# Patient Record
Sex: Female | Born: 1955 | Race: White | Hispanic: No | Smoking: Current every day smoker
Health system: Southern US, Community
[De-identification: ages and names within clinical notes are randomized; demographics above are authoritative.]

## PROBLEM LIST (undated history)

## (undated) DIAGNOSIS — B1920 Unspecified viral hepatitis C without hepatic coma: Secondary | ICD-10-CM

## (undated) DIAGNOSIS — U071 COVID-19: Secondary | ICD-10-CM

## (undated) DIAGNOSIS — I10 Essential (primary) hypertension: Secondary | ICD-10-CM

## (undated) DIAGNOSIS — M1990 Unspecified osteoarthritis, unspecified site: Secondary | ICD-10-CM

## (undated) DIAGNOSIS — F1010 Alcohol abuse, uncomplicated: Secondary | ICD-10-CM

## (undated) DIAGNOSIS — R569 Unspecified convulsions: Secondary | ICD-10-CM

## (undated) HISTORY — PX: ABDOMINAL HYSTERECTOMY: SHX81

## (undated) HISTORY — PX: ELBOW SURGERY: SHX618

---

## 2007-10-12 ENCOUNTER — Ambulatory Visit: Payer: Self-pay | Admitting: Family Medicine

## 2012-05-18 ENCOUNTER — Ambulatory Visit: Payer: Self-pay | Admitting: Emergency Medicine

## 2017-07-18 ENCOUNTER — Other Ambulatory Visit: Payer: Self-pay

## 2017-07-18 ENCOUNTER — Observation Stay
Admission: EM | Admit: 2017-07-18 | Discharge: 2017-07-20 | Disposition: A | Discharge status: Home / Self Care | Payer: Medicaid Other | Attending: Internal Medicine | Admitting: Internal Medicine

## 2017-07-18 DIAGNOSIS — E861 Hypovolemia: Secondary | ICD-10-CM | POA: Diagnosis not present

## 2017-07-18 DIAGNOSIS — Z885 Allergy status to narcotic agent status: Secondary | ICD-10-CM | POA: Insufficient documentation

## 2017-07-18 DIAGNOSIS — F17210 Nicotine dependence, cigarettes, uncomplicated: Secondary | ICD-10-CM | POA: Diagnosis not present

## 2017-07-18 DIAGNOSIS — Z79899 Other long term (current) drug therapy: Secondary | ICD-10-CM | POA: Insufficient documentation

## 2017-07-18 DIAGNOSIS — F419 Anxiety disorder, unspecified: Secondary | ICD-10-CM | POA: Insufficient documentation

## 2017-07-18 DIAGNOSIS — M1990 Unspecified osteoarthritis, unspecified site: Secondary | ICD-10-CM | POA: Diagnosis not present

## 2017-07-18 DIAGNOSIS — E871 Hypo-osmolality and hyponatremia: Principal | ICD-10-CM

## 2017-07-18 DIAGNOSIS — I10 Essential (primary) hypertension: Secondary | ICD-10-CM | POA: Diagnosis not present

## 2017-07-18 DIAGNOSIS — Z882 Allergy status to sulfonamides status: Secondary | ICD-10-CM | POA: Insufficient documentation

## 2017-07-18 DIAGNOSIS — E876 Hypokalemia: Secondary | ICD-10-CM | POA: Insufficient documentation

## 2017-07-18 DIAGNOSIS — E860 Dehydration: Secondary | ICD-10-CM | POA: Diagnosis present

## 2017-07-18 DIAGNOSIS — R1110 Vomiting, unspecified: Secondary | ICD-10-CM | POA: Diagnosis not present

## 2017-07-18 HISTORY — DX: Unspecified convulsions: R56.9

## 2017-07-18 HISTORY — DX: Essential (primary) hypertension: I10

## 2017-07-18 HISTORY — DX: Unspecified osteoarthritis, unspecified site: M19.90

## 2017-07-18 LAB — URINALYSIS, COMPLETE (UACMP) WITH MICROSCOPIC
BACTERIA UA: NONE SEEN
Bilirubin Urine: NEGATIVE
Glucose, UA: NEGATIVE mg/dL
KETONES UR: 5 mg/dL — AB
Leukocytes, UA: NEGATIVE
Nitrite: NEGATIVE
PH: 7 (ref 5.0–8.0)
Protein, ur: NEGATIVE mg/dL
SPECIFIC GRAVITY, URINE: 1.001 — AB (ref 1.005–1.030)
SQUAMOUS EPITHELIAL / LPF: NONE SEEN

## 2017-07-18 LAB — BASIC METABOLIC PANEL
Anion gap: 10 (ref 5–15)
BUN: 8 mg/dL (ref 6–20)
CALCIUM: 9.1 mg/dL (ref 8.9–10.3)
CO2: 22 mmol/L (ref 22–32)
Chloride: 97 mmol/L — ABNORMAL LOW (ref 101–111)
Creatinine, Ser: 0.6 mg/dL (ref 0.44–1.00)
GFR calc Af Amer: >60 mL/min (ref >60)
GLUCOSE: 104 mg/dL — AB (ref 65–99)
POTASSIUM: 3 mmol/L — AB (ref 3.5–5.1)
Sodium: 129 mmol/L — ABNORMAL LOW (ref 135–145)

## 2017-07-18 LAB — CBC WITH DIFFERENTIAL/PLATELET
BASOS ABS: 0.1 10*3/uL (ref 0–0.1)
BASOS PCT: 1 %
Eosinophils Absolute: 0 10*3/uL (ref 0–0.7)
Eosinophils Relative: 1 %
HEMATOCRIT: 45.7 % (ref 35.0–47.0)
HEMOGLOBIN: 16.3 g/dL — AB (ref 12.0–16.0)
Lymphocytes Relative: 29 %
Lymphs Abs: 2.5 10*3/uL (ref 1.0–3.6)
MCH: 32.9 pg (ref 26.0–34.0)
MCHC: 35.5 g/dL (ref 32.0–36.0)
MCV: 92.7 fL (ref 80.0–100.0)
MONOS PCT: 12 %
Monocytes Absolute: 1.1 10*3/uL — ABNORMAL HIGH (ref 0.2–0.9)
NEUTROS ABS: 5.1 10*3/uL (ref 1.4–6.5)
NEUTROS PCT: 57 %
Platelets: 193 10*3/uL (ref 150–440)
RBC: 4.94 MIL/uL (ref 3.80–5.20)
RDW: 12.5 % (ref 11.5–14.5)
WBC: 8.7 10*3/uL (ref 3.6–11.0)

## 2017-07-18 LAB — COMPREHENSIVE METABOLIC PANEL
ALBUMIN: 4.2 g/dL (ref 3.5–5.0)
ALK PHOS: 57 U/L (ref 38–126)
ALT: 19 U/L (ref 14–54)
AST: 48 U/L — AB (ref 15–41)
Anion gap: 14 (ref 5–15)
BILIRUBIN TOTAL: 2 mg/dL — AB (ref 0.3–1.2)
BUN: 8 mg/dL (ref 6–20)
CALCIUM: 9.6 mg/dL (ref 8.9–10.3)
CO2: 23 mmol/L (ref 22–32)
Chloride: 82 mmol/L — ABNORMAL LOW (ref 101–111)
Creatinine, Ser: 0.66 mg/dL (ref 0.44–1.00)
GFR calc Af Amer: >60 mL/min (ref >60)
GFR calc non Af Amer: >60 mL/min (ref >60)
GLUCOSE: 133 mg/dL — AB (ref 65–99)
Potassium: 3.9 mmol/L (ref 3.5–5.1)
Sodium: 119 mmol/L — CL (ref 135–145)
TOTAL PROTEIN: 8.2 g/dL — AB (ref 6.5–8.1)

## 2017-07-18 LAB — LIPASE, BLOOD: Lipase: 19 U/L (ref 11–51)

## 2017-07-18 MED ORDER — OXYCODONE-ACETAMINOPHEN 10-325 MG PO TABS: 1.0000 | ORAL_TABLET | Freq: Four times a day (QID) | ORAL | Status: DC | PRN

## 2017-07-18 MED ORDER — ONDANSETRON HCL 4 MG/2ML IJ SOLN
4.0000 mg | Freq: Once | INTRAMUSCULAR | Status: AC
  Administered 2017-07-18: 4 mg via INTRAVENOUS
  Filled 2017-07-18: qty 2

## 2017-07-18 MED ORDER — LORAZEPAM 2 MG/ML IJ SOLN
1.0000 mg | Freq: Once | INTRAMUSCULAR | Status: AC
  Administered 2017-07-18: 1 mg via INTRAVENOUS
  Filled 2017-07-18: qty 1

## 2017-07-18 MED ORDER — SODIUM CHLORIDE 0.9 % IV SOLN
INTRAVENOUS | Status: DC
  Administered 2017-07-18 (×2): via INTRAVENOUS

## 2017-07-18 MED ORDER — ALPRAZOLAM 1 MG PO TABS
1.0000 mg | ORAL_TABLET | Freq: Two times a day (BID) | ORAL | Status: DC | PRN
  Administered 2017-07-18 – 2017-07-20 (×5): 1 mg via ORAL
  Filled 2017-07-18 (×5): qty 1

## 2017-07-18 MED ORDER — PROPRANOLOL HCL ER 60 MG PO CP24
60.0000 mg | ORAL_CAPSULE | Freq: Every day | ORAL | Status: DC
  Administered 2017-07-18 – 2017-07-20 (×3): 60 mg via ORAL
  Filled 2017-07-18 (×3): qty 1

## 2017-07-18 MED ORDER — DOCUSATE SODIUM 100 MG PO CAPS
100.0000 mg | ORAL_CAPSULE | Freq: Two times a day (BID) | ORAL | Status: DC | PRN
  Administered 2017-07-19: 08:00:00 100 mg via ORAL
  Filled 2017-07-18: qty 1

## 2017-07-18 MED ORDER — HEPARIN SODIUM (PORCINE) 5000 UNIT/ML IJ SOLN
5000.0000 [IU] | Freq: Three times a day (TID) | INTRAMUSCULAR | Status: DC
  Administered 2017-07-18 – 2017-07-20 (×4): 5000 [IU] via SUBCUTANEOUS
  Filled 2017-07-18 (×4): qty 1

## 2017-07-18 MED ORDER — TRAZODONE HCL 50 MG PO TABS
50.0000 mg | ORAL_TABLET | Freq: Every day | ORAL | Status: DC
  Administered 2017-07-18 – 2017-07-19 (×2): 50 mg via ORAL
  Filled 2017-07-18 (×2): qty 1

## 2017-07-18 MED ORDER — ONDANSETRON HCL 4 MG/2ML IJ SOLN
4.0000 mg | Freq: Four times a day (QID) | INTRAMUSCULAR | Status: DC | PRN
  Administered 2017-07-18 – 2017-07-19 (×2): 4 mg via INTRAVENOUS
  Filled 2017-07-18 (×2): qty 2

## 2017-07-18 MED ORDER — OXYCODONE HCL 5 MG PO TABS
5.0000 mg | ORAL_TABLET | Freq: Four times a day (QID) | ORAL | Status: DC | PRN
  Administered 2017-07-19 – 2017-07-20 (×3): 5 mg via ORAL
  Filled 2017-07-18 (×3): qty 1

## 2017-07-18 MED ORDER — SODIUM CHLORIDE 0.9 % IV SOLN
1000.0000 mL | Freq: Once | INTRAVENOUS | Status: AC
  Administered 2017-07-18: 1000 mL via INTRAVENOUS

## 2017-07-18 MED ORDER — OXYCODONE-ACETAMINOPHEN 5-325 MG PO TABS
1.0000 | ORAL_TABLET | Freq: Four times a day (QID) | ORAL | Status: DC | PRN
  Administered 2017-07-18 – 2017-07-20 (×6): 1 via ORAL
  Filled 2017-07-18 (×6): qty 1

## 2017-07-18 MED ORDER — NICOTINE 21 MG/24HR TD PT24
21.0000 mg | MEDICATED_PATCH | Freq: Every day | TRANSDERMAL | Status: DC
  Filled 2017-07-18 (×3): qty 1

## 2017-07-18 NOTE — ED Provider Notes (Addendum)
[facility] Emergency Department Provider Note       Time seen: ----------------------------------------- 7:18 AM on 07/18/2017 -----------------------------------------   I have reviewed the triage vital signs and the nursing notes.  HISTORY   Chief Complaint Nausea    HPI Stephanie Skinner is a 61 y.o. female with a history of arthritis, anxiety, hypertension and seizures who presents to the ED for nausea and vomiting for the past 3 days.  Patient states she also developed some lower back pain yesterday and states she has a history of UTIs.  She reports she is unsure if she had a seizure at home.  She also notes she has been out of her Xanax for at least 2 days and she was concerned she may have a seizure.  She has had persistent vomiting but no diarrhea.  Past Medical History:  Diagnosis Date  . Arthritis   . Hypertension   . Seizures (HCC)     There are no active problems to display for this patient.   Past Surgical History:  Procedure Laterality Date  . ABDOMINAL HYSTERECTOMY     "partial hysterectomy"  . ELBOW SURGERY      Allergies Codeine and Sulfa antibiotics  Social History Social History   Tobacco Use  . Smoking status: Current Every Day Smoker    Packs/day: 0.50    Types: Cigarettes  . Smokeless tobacco: Never Used  Substance Use Topics  . Alcohol use: Yes    Comment: Pt states "couple glasses of wine a week"  . Drug use: No    Review of Systems Constitutional: Negative for fever. Cardiovascular: Negative for chest pain. Respiratory: Negative for shortness of breath. Gastrointestinal: Negative for abdominal pain, positive for vomiting Genitourinary: Positive for dysuria Musculoskeletal: Positive for back pain Skin: Negative for rash. Neurological: Negative for headaches, focal weakness or numbness. Psychiatric: Positive for anxiety  All systems negative/normal/unremarkable except as stated in the  HPI  ____________________________________________   PHYSICAL EXAM:  VITAL SIGNS: ED Triage Vitals  Enc Vitals Group     BP 07/18/17 0659 (!) 174/112     Pulse Rate 07/18/17 0659 82     Resp 07/18/17 0659 17     Temp 07/18/17 0659 97.8 F (36.6 C)     Temp Source 07/18/17 0659 Oral     SpO2 07/18/17 0659 97 %     Weight 07/18/17 0700 150 lb (68 kg)     Height 07/18/17 0700 5\' 6"  (1.676 m)     Head Circumference --      Peak Flow --      Pain Score 07/18/17 0659 8     Pain Loc --      Pain Edu? --      Excl. in GC? --     Constitutional: Alert and oriented.  Anxious, no distress Eyes: Conjunctivae are normal. Normal extraocular movements. ENT   Head: Normocephalic and atraumatic.   Nose: No congestion/rhinnorhea.   Mouth/Throat: Mucous membranes are moist.   Neck: No stridor. Cardiovascular: Normal rate, regular rhythm. No murmurs, rubs, or gallops. Respiratory: Normal respiratory effort without tachypnea nor retractions. Breath sounds are clear and equal bilaterally. No wheezes/rales/rhonchi. Gastrointestinal: Soft and nontender. Normal bowel sounds Musculoskeletal: Nontender with normal range of motion in extremities. No lower extremity tenderness nor edema. Neurologic:  Normal speech and language. No gross focal neurologic deficits are appreciated.  Skin:  Skin is warm, dry and intact. No rash noted. Psychiatric: Depressed mood and affect ____________________________________________  ED  COURSE:  Pertinent labs & imaging results that were available during my care of the patient were reviewed by me and considered in my medical decision making (see chart for details). Patient presents for nausea, vomiting and anxiety, we will assess with labs and imaging as indicated.   Procedures ____________________________________________   LABS (pertinent positives/negatives)  Labs Reviewed  CBC WITH DIFFERENTIAL/PLATELET - Abnormal; Notable for the following  components:      Result Value   Hemoglobin 16.3 (*)    Monocytes Absolute 1.1 (*)    All other components within normal limits  COMPREHENSIVE METABOLIC PANEL - Abnormal; Notable for the following components:   Sodium 119 (*)    Chloride 82 (*)    Glucose, Bld 133 (*)    Total Protein 8.2 (*)    AST 48 (*)    Total Bilirubin 2.0 (*)    All other components within normal limits  LIPASE, BLOOD  URINALYSIS, COMPLETE (UACMP) WITH MICROSCOPIC  ____________________________________________  DIFFERENTIAL DIAGNOSIS   Anxiety, depression, medication withdrawal, gastroenteritis, electrolyte abnormality, dehydration  FINAL ASSESSMENT AND PLAN  Vomiting, anxiety, hyponatremia   Plan: Patient had presented for persistent nausea and vomiting for the past 3 days. Patient's labs did reveal concerning hyponatremia with a sodium of 119 and a chloride of 82.  We have started her on saline as well as Ativan and Zofran.  Due to her hyponatremia she will need to be admitted for further evaluation.   Emily FilbertWilliams, Jonathan E, MD   Note: This note was generated in part or whole with voice recognition software. Voice recognition is usually quite accurate but there are transcription errors that can and very often do occur. I apologize for any typographical errors that were not detected and corrected.     Emily FilbertWilliams, Jonathan E, MD 07/18/17 [id number]    Emily FilbertWilliams, Jonathan E, MD 07/18/17 [id number]

## 2017-07-18 NOTE — ED Triage Notes (Signed)
Per EMS, pt reports N/V for the last 3 days. Pt states she also developed lower back pain yest and states hx of UTIs. Pt reports she is unsure if she has had fever at home. Pt also states she has hx of seizures, pt A&O at this time, in NAD.

## 2017-07-18 NOTE — H&P (Signed)
[facility] [place] [facility]   PATIENT NAME: Stephanie RinksDixie Skinner    MR#:  [id number]  DATE OF BIRTH:  04/10/1956  DATE OF ADMISSION:  07/18/2017  PRIMARY CARE PHYSICIAN: Patient, No Pcp Per   REQUESTING/REFERRING PHYSICIAN: Williams  CHIEF COMPLAINT:   Chief Complaint  Patient presents with  . Nausea    HISTORY OF PRESENT ILLNESS: Stephanie RinksDixie Skinner  is a 61 y.o. female with a known history of Htn, Seizures and anxiety- had nausea and vomit for last 3-4 days. No associated diarrhea or abd pain. Denies any urinary symptoms. Felt very weak yesterday and had chills, so decided to come to ER.  Also ran out of her xanax pills last 2 days ( this happened after she started vomiting, and she could not go get new prescriptions) so feeling very shaky. Noted to be Hyponatremic and dehydrated in ER>  PAST MEDICAL HISTORY:   Past Medical History:  Diagnosis Date  . Arthritis   . Hypertension   . Seizures (HCC)     PAST SURGICAL HISTORY:  Past Surgical History:  Procedure Laterality Date  . ABDOMINAL HYSTERECTOMY     "partial hysterectomy"  . ELBOW SURGERY      SOCIAL HISTORY:  Social History   Tobacco Use  . Smoking status: Current Every Day Smoker    Packs/day: 0.50    Types: Cigarettes  . Smokeless tobacco: Never Used  Substance Use Topics  . Alcohol use: Yes    Comment: Pt states "couple glasses of wine a week"    FAMILY HISTORY:  Family History  Problem Relation Age of Onset  . Diabetes Sister   . Colon cancer Sister     DRUG ALLERGIES:  Allergies  Allergen Reactions  . Codeine Nausea And Vomiting  . Sulfa Antibiotics Nausea And Vomiting    REVIEW OF SYSTEMS:   CONSTITUTIONAL: No fever,positive for fatigue or weakness.  EYES: No blurred or double vision.  EARS, NOSE, AND THROAT: No tinnitus or ear pain.  RESPIRATORY: No cough, shortness of breath, wheezing or hemoptysis.  CARDIOVASCULAR: No chest pain, orthopnea, edema.  GASTROINTESTINAL:  positive for nausea, vomiting,no diarrhea or abdominal pain.  GENITOURINARY: No dysuria, hematuria.  ENDOCRINE: No polyuria, nocturia,  HEMATOLOGY: No anemia, easy bruising or bleeding SKIN: No rash or lesion. MUSCULOSKELETAL: No joint pain or arthritis.   NEUROLOGIC: No tingling, numbness, weakness.  PSYCHIATRY: No anxiety or depression.   MEDICATIONS AT HOME:  Prior to Admission medications   Medication Sig Start Date End Date Taking? Authorizing Provider  oxyCODONE-acetaminophen (PERCOCET) 10-325 MG tablet Take 1 tablet by mouth every 6 (six) hours as needed for pain.   Yes [provider]  ALPRAZolam Prudy Feeler(XANAX) 1 MG tablet Take 1 tablet by mouth 2 (two) times daily as needed. 07/14/17   [provider]  cetirizine (ZYRTEC) 10 MG tablet Take 10 mg by mouth daily. 07/09/17   [provider]  propranolol ER (INDERAL LA) 60 MG 24 hr capsule Take 1 capsule by mouth daily. 06/17/17   [provider]  traZODone (DESYREL) 50 MG tablet Take 1 tablet by mouth at bedtime. 07/14/17   [provider]  Vitamin D, Ergocalciferol, (DRISDOL) 50000 units CAPS capsule Take 1 capsule by mouth once a week. 07/09/17   [provider]      PHYSICAL EXAMINATION:   VITAL SIGNS: Blood pressure (!) 174/112, pulse 82, temperature 97.8 F (36.6 C), temperature source Oral, resp. rate 17, height 5\' 6"  (1.676 m), weight 68  kg (150 lb), SpO2 97 %.  GENERAL:  61 y.o.-year-old patient lying in the bed with no acute distress.  EYES: Pupils equal, round, reactive to light and accommodation. No scleral icterus. Extraocular muscles intact.  HEENT: Head atraumatic, normocephalic. Oropharynx and nasopharynx clear.  NECK:  Supple, no jugular venous distention. No thyroid enlargement, no tenderness.  LUNGS: Normal breath sounds bilaterally, no wheezing, rales,rhonchi or crepitation. No use of accessory muscles of respiration.  CARDIOVASCULAR: S1, S2 normal. No murmurs,  rubs, or gallops.  ABDOMEN: Soft, nontender, nondistended. Bowel sounds present. No organomegaly or mass.  EXTREMITIES: No pedal edema, cyanosis, or clubbing.  NEUROLOGIC: Cranial nerves II through XII are intact. Muscle strength 5/5 in all extremities. Sensation intact. Gait not checked.  PSYCHIATRIC: The patient is alert and oriented x 3.  SKIN: No obvious rash, lesion, or ulcer.   LABORATORY PANEL:   CBC Recent Labs  Lab 07/18/17 0719  WBC 8.7  HGB 16.3*  HCT 45.7  PLT 193  MCV 92.7  MCH 32.9  MCHC 35.5  RDW 12.5  LYMPHSABS 2.5  MONOABS 1.1*  EOSABS 0.0  BASOSABS 0.1   ------------------------------------------------------------------------------------------------------------------  Chemistries  Recent Labs  Lab 07/18/17 0719  NA 119*  K 3.9  CL 82*  CO2 23  GLUCOSE 133*  BUN 8  CREATININE 0.66  CALCIUM 9.6  AST 48*  ALT 19  ALKPHOS 57  BILITOT 2.0*   ------------------------------------------------------------------------------------------------------------------ estimated creatinine clearance is 68.3 mL/min (by C-G formula based on SCr of 0.66 mg/dL). ------------------------------------------------------------------------------------------------------------------ No results for input(s): TSH, T4TOTAL, T3FREE, THYROIDAB in the last 72 hours.  Invalid input(s): FREET3   Coagulation profile No results for input(s): INR, PROTIME in the last 168 hours. ------------------------------------------------------------------------------------------------------------------- No results for input(s): DDIMER in the last 72 hours. -------------------------------------------------------------------------------------------------------------------  Cardiac Enzymes No results for input(s): CKMB, TROPONINI, MYOGLOBIN in the last 168 hours.  Invalid input(s):  CK ------------------------------------------------------------------------------------------------------------------ Invalid input(s): POCBNP  ---------------------------------------------------------------------------------------------------------------  Urinalysis No results found for: COLORURINE, APPEARANCEUR, LABSPEC, PHURINE, GLUCOSEU, HGBUR, BILIRUBINUR, KETONESUR, PROTEINUR, UROBILINOGEN, NITRITE, LEUKOCYTESUR   RADIOLOGY: No results found.  EKG: No orders found for this or any previous visit.  IMPRESSION AND PLAN:  * Hyponatremia   Hypovolemic due to Vomiting and dehydration   IV fluids, NS    * Dehydration   IV fluids  * Vomiting   IV zofran   Likely Viral gastritis  * Htn   Uncontrolled as missed her tablets yesterday   COnt Home meds  * Anxiety   Cont xanax  * Hx of seizures   She is off any seizure meds for long time.   Cont monitor with xanax.  * Active smoking   Tobacco cesation counceling   Councelled for 4 min, offered nicotine patch.  All the records are reviewed and case discussed with ED provider. Management plans discussed with the patient, family and they are in agreement.  CODE STATUS: Full. Code Status History    This patient does not have a recorded code status. Please follow your organizational policy for patients in this situation.       TOTAL TIME TAKING CARE OF THIS PATIENT: 55 minutes.    Altamese DillingVaibhavkumar Donte Kary M.D on 07/18/2017   Between 7am to 6pm - Pager - [phone]  After 6pm go to [email or web address] - password [email or web address]  [facility]  Office  [phone]  CC: Primary care physician; Patient, No Pcp Per   Note: This dictation was prepared with Dragon dictation along with smaller phrase technology. Any transcriptional  errors that result from this process are unintentional.

## 2017-07-18 NOTE — ED Notes (Signed)
Date and time results received: 07/18/17 0746   Test: Sodium Critical Value: 119  Name of Provider Notified: Dr. Mayford KnifeWilliams  Orders Received? Or Actions Taken?: Acknowledged, no new orders at this time.

## 2017-07-19 LAB — CBC
HCT: 38.9 % (ref 35.0–47.0)
HEMOGLOBIN: 13.6 g/dL (ref 12.0–16.0)
MCH: 33.5 pg (ref 26.0–34.0)
MCHC: 34.9 g/dL (ref 32.0–36.0)
MCV: 96.1 fL (ref 80.0–100.0)
PLATELETS: 172 10*3/uL (ref 150–440)
RBC: 4.05 MIL/uL (ref 3.80–5.20)
RDW: 12.7 % (ref 11.5–14.5)
WBC: 5.7 10*3/uL (ref 3.6–11.0)

## 2017-07-19 LAB — BASIC METABOLIC PANEL
Anion gap: 5 (ref 5–15)
BUN: 6 mg/dL (ref 6–20)
CO2: 27 mmol/L (ref 22–32)
CREATININE: 0.63 mg/dL (ref 0.44–1.00)
Calcium: 8.9 mg/dL (ref 8.9–10.3)
Chloride: 102 mmol/L (ref 101–111)
GFR calc Af Amer: >60 mL/min (ref >60)
GLUCOSE: 105 mg/dL — AB (ref 65–99)
Potassium: 2.8 mmol/L — ABNORMAL LOW (ref 3.5–5.1)
Sodium: 134 mmol/L — ABNORMAL LOW (ref 135–145)

## 2017-07-19 LAB — MAGNESIUM: MAGNESIUM: 1.9 mg/dL (ref 1.7–2.4)

## 2017-07-19 LAB — HEPATIC FUNCTION PANEL
ALT: 20 U/L (ref 14–54)
AST: 26 U/L (ref 15–41)
Albumin: 3.6 g/dL (ref 3.5–5.0)
Alkaline Phosphatase: 45 U/L (ref 38–126)
BILIRUBIN DIRECT: 0.1 mg/dL (ref 0.1–0.5)
BILIRUBIN INDIRECT: 0.6 mg/dL (ref 0.3–0.9)
TOTAL PROTEIN: 6.7 g/dL (ref 6.5–8.1)
Total Bilirubin: 0.7 mg/dL (ref 0.3–1.2)

## 2017-07-19 LAB — HIV ANTIBODY (ROUTINE TESTING W REFLEX): HIV Screen 4th Generation wRfx: NONREACTIVE

## 2017-07-19 LAB — TSH: TSH: 1.997 u[IU]/mL (ref 0.350–4.500)

## 2017-07-19 MED ORDER — POTASSIUM CHLORIDE IN NACL 40-0.9 MEQ/L-% IV SOLN
INTRAVENOUS | Status: AC
  Administered 2017-07-19: 11:00:00 50 mL/h via INTRAVENOUS
  Filled 2017-07-19: qty 1000

## 2017-07-19 MED ORDER — POTASSIUM CHLORIDE CRYS ER 20 MEQ PO TBCR
40.0000 meq | EXTENDED_RELEASE_TABLET | Freq: Two times a day (BID) | ORAL | Status: DC
  Administered 2017-07-19 – 2017-07-20 (×3): 40 meq via ORAL
  Filled 2017-07-19 (×3): qty 2

## 2017-07-19 NOTE — Plan of Care (Signed)
  Progressing Education: Knowledge of General Education information will improve 07/19/2017 0309 - Progressing by Florinda MarkerWelch, Jaydalee Bardwell T, RN Health Behavior/Discharge Planning: Ability to manage health-related needs will improve 07/19/2017 0309 - Progressing by Florinda MarkerWelch, Myrtis Maille T, RN Clinical Measurements: Ability to maintain clinical measurements within normal limits will improve 07/19/2017 0309 - Progressing by Florinda MarkerWelch, Maxxwell Edgett T, RN Will remain free from infection 07/19/2017 0309 - Progressing by Florinda MarkerWelch, Hewitt Garner T, RN Diagnostic test results will improve 07/19/2017 0309 - Progressing by Florinda MarkerWelch, Severo Beber T, RN Respiratory complications will improve 07/19/2017 0309 - Progressing by Florinda MarkerWelch, Ragan Duhon T, RN Cardiovascular complication will be avoided 07/19/2017 0309 - Progressing by Florinda MarkerWelch, Maryann Mccall T, RN

## 2017-07-19 NOTE — Progress Notes (Signed)
[facility] - [place] at [facility]   PATIENT NAME: Stephanie RinksDixie Skinner    MR#:  [id number]  DATE OF BIRTH:  12/19/1955  SUBJECTIVE:  CHIEF COMPLAINT:   Chief Complaint  Patient presents with  . Nausea     Improved, no vomit anymore. Tolerated liquid diet. Did not sleep much last night. Potassium is very low, Sodium improved.  REVIEW OF SYSTEMS:  CONSTITUTIONAL: No fever,positive for fatigue or weakness.  EYES: No blurred or double vision.  EARS, NOSE, AND THROAT: No tinnitus or ear pain.  RESPIRATORY: No cough, shortness of breath, wheezing or hemoptysis.  CARDIOVASCULAR: No chest pain, orthopnea, edema.  GASTROINTESTINAL: No nausea, vomiting, diarrhea or abdominal pain.  GENITOURINARY: No dysuria, hematuria.  ENDOCRINE: No polyuria, nocturia,  HEMATOLOGY: No anemia, easy bruising or bleeding SKIN: No rash or lesion. MUSCULOSKELETAL: No joint pain or arthritis.   NEUROLOGIC: No tingling, numbness, weakness.  PSYCHIATRY: No anxiety or depression.   ROS  DRUG ALLERGIES:   Allergies  Allergen Reactions  . Codeine Nausea And Vomiting  . Sulfa Antibiotics Nausea And Vomiting    VITALS:  Blood pressure (!) 134/58, pulse 67, temperature 97.9 F (36.6 C), temperature source Oral, resp. rate 18, height 5\' 6"  (1.676 m), weight 68 kg (150 lb), SpO2 96 %.  PHYSICAL EXAMINATION:  GENERAL:  61 y.o.-year-old patient lying in the bed with no acute distress.  EYES: Pupils equal, round, reactive to light and accommodation. No scleral icterus. Extraocular muscles intact.  HEENT: Head atraumatic, normocephalic. Oropharynx and nasopharynx clear.  NECK:  Supple, no jugular venous distention. No thyroid enlargement, no tenderness.  LUNGS: Normal breath sounds bilaterally, no wheezing, rales,rhonchi or crepitation. No use of accessory muscles of respiration.  CARDIOVASCULAR: S1, S2 normal. No murmurs, rubs, or gallops.  ABDOMEN: Soft, nontender, nondistended. Bowel sounds present.  No organomegaly or mass.  EXTREMITIES: No pedal edema, cyanosis, or clubbing.  NEUROLOGIC: Cranial nerves II through XII are intact. Muscle strength 5/5 in all extremities. Sensation intact. Gait not checked.  PSYCHIATRIC: The patient is alert and oriented x 3.  SKIN: No obvious rash, lesion, or ulcer.   Physical Exam LABORATORY PANEL:   CBC Recent Labs  Lab 07/19/17 0400  WBC 5.7  HGB 13.6  HCT 38.9  PLT 172   ------------------------------------------------------------------------------------------------------------------  Chemistries  Recent Labs  Lab 07/19/17 0400  NA 134*  K 2.8*  CL 102  CO2 27  GLUCOSE 105*  BUN 6  CREATININE 0.63  CALCIUM 8.9  MG 1.9  AST 26  ALT 20  ALKPHOS 45  BILITOT 0.7   ------------------------------------------------------------------------------------------------------------------  Cardiac Enzymes No results for input(s): TROPONINI in the last 168 hours. ------------------------------------------------------------------------------------------------------------------  RADIOLOGY:  No results found.  ASSESSMENT AND PLAN:   Principal Problem:   Hyponatremia   * Hyponatremia   Hypovolemic due to Vomiting and dehydration   IV fluids, NS  TSH normal.    Sodium improved.    * Dehydration   IV fluids  * Hypokalemia    Checked Mg, replace IV and oral.  * Vomiting   IV zofran   Likely Viral gastritis   Improved now.  * Htn   Uncontrolled as missed her tablets yesterday   COnt Home meds  * Anxiety   Cont xanax  * Hx of seizures   She is off any seizure meds for long time.   Cont monitor with xanax.  * Active smoking   Tobacco cesation counceling   Councelled for 4 min, offered nicotine  patch.   All the records are reviewed and case discussed with Care Management/Social Workerr. Management plans discussed with the patient, family and they are in agreement.  CODE STATUS: full.  TOTAL TIME TAKING CARE  OF THIS PATIENT: 35 minutes.     POSSIBLE D/C IN 1-2 DAYS, DEPENDING ON CLINICAL CONDITION.   Altamese DillingVaibhavkumar Waseem Suess M.D on 07/19/2017   Between 7am to 6pm - Pager - [phone]  After 6pm go to [email or web address] - password [email or web address]  [facility]  Office  [phone]  CC: Primary care physician; Patient, No Pcp Per  Note: This dictation was prepared with Dragon dictation along with smaller phrase technology. Any transcriptional errors that result from this process are unintentional.

## 2017-07-20 DIAGNOSIS — E860 Dehydration: Secondary | ICD-10-CM | POA: Diagnosis present

## 2017-07-20 LAB — COMPREHENSIVE METABOLIC PANEL
ALBUMIN: 3.4 g/dL — AB (ref 3.5–5.0)
ALK PHOS: 40 U/L (ref 38–126)
ALT: 22 U/L (ref 14–54)
AST: 29 U/L (ref 15–41)
Anion gap: 5 (ref 5–15)
BILIRUBIN TOTAL: 0.6 mg/dL (ref 0.3–1.2)
BUN: 6 mg/dL (ref 6–20)
CALCIUM: 8.9 mg/dL (ref 8.9–10.3)
CO2: 26 mmol/L (ref 22–32)
Chloride: 106 mmol/L (ref 101–111)
Creatinine, Ser: 0.63 mg/dL (ref 0.44–1.00)
GFR calc Af Amer: >60 mL/min (ref >60)
GFR calc non Af Amer: >60 mL/min (ref >60)
GLUCOSE: 97 mg/dL (ref 65–99)
POTASSIUM: 4.2 mmol/L (ref 3.5–5.1)
Sodium: 137 mmol/L (ref 135–145)
Total Protein: 6.1 g/dL — ABNORMAL LOW (ref 6.5–8.1)

## 2017-07-20 MED ORDER — PROPRANOLOL HCL ER 60 MG PO CP24: 60.0000 mg | ORAL_CAPSULE | Freq: Every day | ORAL | 0 refills | Status: DC

## 2017-07-20 NOTE — Plan of Care (Signed)
  Adequate for Discharge Education: Knowledge of General Education information will improve 07/20/2017 0936 - Adequate for Discharge by Andreina Outten, Toma Aranonna K, RN Health Behavior/Discharge Planning: Ability to manage health-related needs will improve 07/20/2017 0936 - Adequate for Discharge by Marrian SalvageHeslep, Solangel Mcmanaway K, RN Clinical Measurements: Ability to maintain clinical measurements within normal limits will improve 07/20/2017 0936 - Adequate for Discharge by Marrian SalvageHeslep, Avah Bashor K, RN Will remain free from infection 07/20/2017 [id number] - Adequate for Discharge by Marrian SalvageHeslep, Coree Brame K, RN Diagnostic test results will improve 07/20/2017 0936 - Adequate for Discharge by Marrian SalvageHeslep, Michella Detjen K, RN Respiratory complications will improve 07/20/2017 0936 - Adequate for Discharge by Marrian SalvageHeslep, Sou Nohr K, RN Cardiovascular complication will be avoided 07/20/2017 [id number] - Adequate for Discharge by Antwoin Lackey, Toma Aranonna K, RN

## 2017-07-20 NOTE — Discharge Summary (Signed)
[facility] [place] at [facility]   PATIENT NAME: Stephanie RinksDixie Skinner    MR#:  [id number]  DATE OF BIRTH:  07/24/1956  DATE OF ADMISSION:  07/18/2017 ADMITTING PHYSICIAN: Altamese DillingVaibhavkumar Nanda Bittick, MD  DATE OF DISCHARGE: 07/20/2017  PRIMARY CARE PHYSICIAN: Patient, No Pcp Per    ADMISSION DIAGNOSIS:  Dehydration [E86.0] Anxiety [F41.9] Hyponatremia [E87.1]  DISCHARGE DIAGNOSIS:  Principal Problem:   Hyponatremia   SECONDARY DIAGNOSIS:   Past Medical History:  Diagnosis Date  . Arthritis   . Hypertension   . Seizures (HCC)     HOSPITAL COURSE:   * Hyponatremia Hypovolemic due to Vomiting and dehydration IV fluids, NS  TSH normal.    Sodium improved.  * Dehydration IV fluids  * Hypokalemia    Checked Mg, replace IV and oral.   Improved now.  * Vomiting IV zofran Likely Viral gastritis   Improved now.  * Htn Uncontrolled as missed her tablets yesterday COnt Home meds  * Anxiety Cont xanax  * Hx of seizures She is off any seizure meds for long time. Cont monitor with xanax.  * Active smoking Tobacco cesation counceling Councelled for 4 min, offered nicotine patch.    DISCHARGE CONDITIONS:   Stable.  CONSULTS OBTAINED:    DRUG ALLERGIES:   Allergies  Allergen Reactions  . Codeine Nausea And Vomiting  . Sulfa Antibiotics Nausea And Vomiting    DISCHARGE MEDICATIONS:   Current Discharge Medication List    CONTINUE these medications which have CHANGED   Details  propranolol ER (INDERAL LA) 60 MG 24 hr capsule Take 1 capsule (60 mg total) by mouth daily. Qty: 7 capsule, Refills: 0      CONTINUE these medications which have NOT CHANGED   Details  oxyCODONE-acetaminophen (PERCOCET) 10-325 MG tablet Take 1 tablet by mouth every 6 (six) hours as needed for pain.    ALPRAZolam (XANAX) 1 MG tablet Take 1 tablet by mouth 2 (two) times daily as needed. Refills: 3    cetirizine (ZYRTEC)  10 MG tablet Take 10 mg by mouth daily. Refills: 4    traZODone (DESYREL) 50 MG tablet Take 1 tablet by mouth at bedtime. Refills: 4    Vitamin D, Ergocalciferol, (DRISDOL) 50000 units CAPS capsule Take 1 capsule by mouth once a week. Refills: 5         DISCHARGE INSTRUCTIONS:    Follow with PMD in 1-2 weeks.  If you experience worsening of your admission symptoms, develop shortness of breath, life threatening emergency, suicidal or homicidal thoughts you must seek medical attention immediately by calling 911 or calling your MD immediately  if symptoms less severe.  You Must read complete instructions/literature along with all the possible adverse reactions/side effects for all the Medicines you take and that have been prescribed to you. Take any new Medicines after you have completely understood and accept all the possible adverse reactions/side effects.   Please note  You were cared for by a hospitalist during your hospital stay. If you have any questions about your discharge medications or the care you received while you were in the hospital after you are discharged, you can call the unit and asked to speak with the hospitalist on call if the hospitalist that took care of you is not available. Once you are discharged, your primary care physician will handle any further medical issues. Please note that NO REFILLS for any discharge medications will be authorized once you are discharged, as it is imperative that you  return to your primary care physician (or establish a relationship with a primary care physician if you do not have one) for your aftercare needs so that they can reassess your need for medications and monitor your lab values.    Today   CHIEF COMPLAINT:   Chief Complaint  Patient presents with  . Nausea    HISTORY OF PRESENT ILLNESS:  Stephanie Skinner  is a 61 y.o. female with a known history of Htn, Seizures and anxiety- had nausea and vomit for last 3-4 days. No  associated diarrhea or abd pain. Denies any urinary symptoms. Felt very weak yesterday and had chills, so decided to come to ER.  Also ran out of her xanax pills last 2 days ( this happened after she started vomiting, and she could not go get new prescriptions) so feeling very shaky. Noted to be Hyponatremic and dehydrated in ER   VITAL SIGNS:  Blood pressure (!) 141/61, pulse 61, temperature 97.9 F (36.6 C), temperature source Oral, resp. rate 19, height 5\' 6"  (1.676 m), weight 68 kg (150 lb), SpO2 97 %.  I/O:    Intake/Output Summary (Last 24 hours) at 07/20/2017 0904 Last data filed at 07/20/2017 0857 Gross per 24 hour  Intake 240 ml  Output -  Net 240 ml    PHYSICAL EXAMINATION:  GENERAL:  61 y.o.-year-old patient lying in the bed with no acute distress.  EYES: Pupils equal, round, reactive to light and accommodation. No scleral icterus. Extraocular muscles intact.  HEENT: Head atraumatic, normocephalic. Oropharynx and nasopharynx clear.  NECK:  Supple, no jugular venous distention. No thyroid enlargement, no tenderness.  LUNGS: Normal breath sounds bilaterally, no wheezing, rales,rhonchi or crepitation. No use of accessory muscles of respiration.  CARDIOVASCULAR: S1, S2 normal. No murmurs, rubs, or gallops.  ABDOMEN: Soft, non-tender, non-distended. Bowel sounds present. No organomegaly or mass.  EXTREMITIES: No pedal edema, cyanosis, or clubbing.  NEUROLOGIC: Cranial nerves II through XII are intact. Muscle strength 5/5 in all extremities. Sensation intact. Gait not checked.  PSYCHIATRIC: The patient is alert and oriented x 3.  SKIN: No obvious rash, lesion, or ulcer.   DATA REVIEW:   CBC Recent Labs  Lab 07/19/17 0400  WBC 5.7  HGB 13.6  HCT 38.9  PLT 172    Chemistries  Recent Labs  Lab 07/19/17 0400 07/20/17 0350  NA 134* 137  K 2.8* 4.2  CL 102 106  CO2 27 26  GLUCOSE 105* 97  BUN 6 6  CREATININE 0.63 0.63  CALCIUM 8.9 8.9  MG 1.9  --   AST 26 29   ALT 20 22  ALKPHOS 45 40  BILITOT 0.7 0.6    Cardiac Enzymes No results for input(s): TROPONINI in the last 168 hours.  Microbiology Results  No results found for this or any previous visit.  RADIOLOGY:  No results found.  EKG:  No orders found for this or any previous visit.    Management plans discussed with the patient, family and they are in agreement.  CODE STATUS:     Code Status Orders  (From admission, onward)        Start     Ordered   07/18/17 1013  Full code  Continuous     07/18/17 1012    Code Status History    Date Active Date Inactive Code Status Order ID Comments User Context   This patient has a current code status but no historical code status.  TOTAL TIME TAKING CARE OF THIS PATIENT: 35 minutes.    Altamese DillingVaibhavkumar Kodi Steil M.D on 07/20/2017 at 9:04 AM  Between 7am to 6pm - Pager - [phone]  After 6pm go to [email or web address] - password [email or web address]  [facility]  Office  [phone]  CC: Primary care physician; Patient, No Pcp Per   Note: This dictation was prepared with Dragon dictation along with smaller phrase technology. Any transcriptional errors that result from this process are unintentional.

## 2017-08-10 ENCOUNTER — Other Ambulatory Visit: Payer: Self-pay

## 2017-08-10 ENCOUNTER — Encounter: Payer: Self-pay | Admitting: Emergency Medicine

## 2017-08-10 ENCOUNTER — Inpatient Hospital Stay
Admission: EM | Admit: 2017-08-10 | Discharge: 2017-08-11 | DRG: 897 | Disposition: A | Discharge status: Home / Self Care | Payer: Medicaid Other | Attending: Specialist | Admitting: Specialist

## 2017-08-10 DIAGNOSIS — Z885 Allergy status to narcotic agent status: Secondary | ICD-10-CM

## 2017-08-10 DIAGNOSIS — F17210 Nicotine dependence, cigarettes, uncomplicated: Secondary | ICD-10-CM | POA: Diagnosis present

## 2017-08-10 DIAGNOSIS — F13939 Sedative, hypnotic or anxiolytic use, unspecified with withdrawal, unspecified: Secondary | ICD-10-CM

## 2017-08-10 DIAGNOSIS — F411 Generalized anxiety disorder: Secondary | ICD-10-CM | POA: Diagnosis present

## 2017-08-10 DIAGNOSIS — I10 Essential (primary) hypertension: Secondary | ICD-10-CM | POA: Diagnosis present

## 2017-08-10 DIAGNOSIS — F1110 Opioid abuse, uncomplicated: Secondary | ICD-10-CM | POA: Diagnosis present

## 2017-08-10 DIAGNOSIS — Z882 Allergy status to sulfonamides status: Secondary | ICD-10-CM

## 2017-08-10 DIAGNOSIS — E871 Hypo-osmolality and hyponatremia: Secondary | ICD-10-CM | POA: Diagnosis not present

## 2017-08-10 DIAGNOSIS — Z8619 Personal history of other infectious and parasitic diseases: Secondary | ICD-10-CM | POA: Diagnosis not present

## 2017-08-10 DIAGNOSIS — R569 Unspecified convulsions: Secondary | ICD-10-CM | POA: Diagnosis present

## 2017-08-10 DIAGNOSIS — F1310 Sedative, hypnotic or anxiolytic abuse, uncomplicated: Secondary | ICD-10-CM | POA: Diagnosis not present

## 2017-08-10 DIAGNOSIS — G8929 Other chronic pain: Secondary | ICD-10-CM | POA: Diagnosis present

## 2017-08-10 DIAGNOSIS — R112 Nausea with vomiting, unspecified: Secondary | ICD-10-CM | POA: Diagnosis present

## 2017-08-10 DIAGNOSIS — F13239 Sedative, hypnotic or anxiolytic dependence with withdrawal, unspecified: Secondary | ICD-10-CM

## 2017-08-10 DIAGNOSIS — E861 Hypovolemia: Secondary | ICD-10-CM | POA: Diagnosis present

## 2017-08-10 DIAGNOSIS — F13230 Sedative, hypnotic or anxiolytic dependence with withdrawal, uncomplicated: Principal | ICD-10-CM | POA: Diagnosis present

## 2017-08-10 HISTORY — DX: Unspecified viral hepatitis C without hepatic coma: B19.20

## 2017-08-10 LAB — BASIC METABOLIC PANEL
Anion gap: 15 (ref 5–15)
BUN: 6 mg/dL (ref 6–20)
CHLORIDE: 84 mmol/L — AB (ref 101–111)
CO2: 23 mmol/L (ref 22–32)
CREATININE: 0.5 mg/dL (ref 0.44–1.00)
Calcium: 9.5 mg/dL (ref 8.9–10.3)
GFR calc Af Amer: >60 mL/min (ref >60)
GFR calc non Af Amer: >60 mL/min (ref >60)
Glucose, Bld: 135 mg/dL — ABNORMAL HIGH (ref 65–99)
Potassium: 3.6 mmol/L (ref 3.5–5.1)
SODIUM: 122 mmol/L — AB (ref 135–145)

## 2017-08-10 LAB — MAGNESIUM: Magnesium: 1.7 mg/dL (ref 1.7–2.4)

## 2017-08-10 LAB — CBC
HCT: 43.5 % (ref 35.0–47.0)
HEMOGLOBIN: 15.3 g/dL (ref 12.0–16.0)
MCH: 33.4 pg (ref 26.0–34.0)
MCHC: 35.2 g/dL (ref 32.0–36.0)
MCV: 95.1 fL (ref 80.0–100.0)
Platelets: 168 10*3/uL (ref 150–440)
RBC: 4.57 MIL/uL (ref 3.80–5.20)
RDW: 12.1 % (ref 11.5–14.5)
WBC: 6.7 10*3/uL (ref 3.6–11.0)

## 2017-08-10 LAB — OSMOLALITY: Osmolality: 254 mOsm/kg — ABNORMAL LOW (ref 275–295)

## 2017-08-10 LAB — TROPONIN I: Troponin I: <0.03 ng/mL (ref <0.03)

## 2017-08-10 LAB — TSH: TSH: 1.972 u[IU]/mL (ref 0.350–4.500)

## 2017-08-10 LAB — SODIUM
Sodium: 128 mmol/L — ABNORMAL LOW (ref 135–145)
Sodium: 133 mmol/L — ABNORMAL LOW (ref 135–145)

## 2017-08-10 MED ORDER — SODIUM CHLORIDE 0.9 % IV SOLN
INTRAVENOUS | Status: DC
  Administered 2017-08-10 (×2): via INTRAVENOUS

## 2017-08-10 MED ORDER — DOCUSATE SODIUM 100 MG PO CAPS
100.0000 mg | ORAL_CAPSULE | Freq: Two times a day (BID) | ORAL | Status: DC
  Filled 2017-08-10 (×2): qty 1

## 2017-08-10 MED ORDER — LORAZEPAM 0.5 MG PO TABS
0.5000 mg | ORAL_TABLET | Freq: Once | ORAL | Status: AC
  Administered 2017-08-10: 0.5 mg via ORAL
  Filled 2017-08-10: qty 1

## 2017-08-10 MED ORDER — ALPRAZOLAM 1 MG PO TABS
1.0000 mg | ORAL_TABLET | Freq: Two times a day (BID) | ORAL | Status: DC
  Administered 2017-08-10 – 2017-08-11 (×3): 1 mg via ORAL
  Filled 2017-08-10 (×3): qty 1

## 2017-08-10 MED ORDER — LORAZEPAM 2 MG/ML IJ SOLN
1.0000 mg | Freq: Four times a day (QID) | INTRAMUSCULAR | Status: DC | PRN
  Administered 2017-08-10: 08:00:00 1 mg via INTRAVENOUS
  Filled 2017-08-10: qty 1

## 2017-08-10 MED ORDER — CLONIDINE HCL 0.1 MG PO TABS
0.1000 mg | ORAL_TABLET | Freq: Once | ORAL | Status: AC
  Administered 2017-08-10: 0.1 mg via ORAL
  Filled 2017-08-10: qty 1

## 2017-08-10 MED ORDER — LORAZEPAM 1 MG PO TABS
1.00 mg | ORAL_TABLET | ORAL | Status: DC
Start: ? — End: 2017-08-10

## 2017-08-10 MED ORDER — PROPRANOLOL HCL ER 60 MG PO CP24
60.0000 mg | ORAL_CAPSULE | Freq: Every day | ORAL | Status: DC
  Administered 2017-08-10 – 2017-08-11 (×2): 60 mg via ORAL
  Filled 2017-08-10 (×2): qty 1

## 2017-08-10 MED ORDER — ONDANSETRON HCL 4 MG/2ML IJ SOLN
4.0000 mg | Freq: Four times a day (QID) | INTRAMUSCULAR | Status: DC | PRN
  Administered 2017-08-10 (×2): 4 mg via INTRAVENOUS
  Filled 2017-08-10 (×2): qty 2

## 2017-08-10 MED ORDER — ENOXAPARIN SODIUM 40 MG/0.4ML ~~LOC~~ SOLN
40.0000 mg | SUBCUTANEOUS | Status: DC
  Administered 2017-08-10: 40 mg via SUBCUTANEOUS
  Filled 2017-08-10: qty 0.4

## 2017-08-10 MED ORDER — LORATADINE 10 MG PO TABS
10.0000 mg | ORAL_TABLET | Freq: Every day | ORAL | Status: DC
Start: 2017-08-10 — End: 2017-08-11
  Administered 2017-08-10 – 2017-08-11 (×2): 10 mg via ORAL
  Filled 2017-08-10 (×2): qty 1

## 2017-08-10 MED ORDER — ONDANSETRON 4 MG PO TBDP
4.0000 mg | ORAL_TABLET | Freq: Once | ORAL | Status: AC
  Administered 2017-08-10: 4 mg via ORAL
  Filled 2017-08-10: qty 1

## 2017-08-10 MED ORDER — PROCHLORPERAZINE EDISYLATE 5 MG/ML IJ SOLN
10.0000 mg | Freq: Four times a day (QID) | INTRAMUSCULAR | Status: DC | PRN
  Filled 2017-08-10: qty 2

## 2017-08-10 MED ORDER — ACETAMINOPHEN 325 MG PO TABS
650.0000 mg | ORAL_TABLET | Freq: Four times a day (QID) | ORAL | Status: DC | PRN
Start: 2017-08-10 — End: 2017-08-11

## 2017-08-10 MED ORDER — IBUPROFEN 400 MG PO TABS
400.0000 mg | ORAL_TABLET | Freq: Once | ORAL | Status: AC
  Administered 2017-08-10: 400 mg via ORAL
  Filled 2017-08-10: qty 1

## 2017-08-10 MED ORDER — ALPRAZOLAM 1 MG PO TABS
1.0000 mg | ORAL_TABLET | Freq: Two times a day (BID) | ORAL | Status: DC | PRN
  Administered 2017-08-10: 11:00:00 1 mg via ORAL
  Filled 2017-08-10: qty 1

## 2017-08-10 MED ORDER — ONDANSETRON HCL 4 MG PO TABS
4.0000 mg | ORAL_TABLET | Freq: Four times a day (QID) | ORAL | Status: DC | PRN
  Administered 2017-08-10 – 2017-08-11 (×2): 4 mg via ORAL
  Filled 2017-08-10 (×2): qty 1

## 2017-08-10 MED ORDER — SODIUM CHLORIDE 0.9 % IV BOLUS (SEPSIS)
500.0000 mL | Freq: Once | INTRAVENOUS | Status: AC
  Administered 2017-08-10: 500 mL via INTRAVENOUS

## 2017-08-10 MED ORDER — ACETAMINOPHEN 650 MG RE SUPP: 650.0000 mg | Freq: Four times a day (QID) | RECTAL | Status: DC | PRN

## 2017-08-10 NOTE — ED Notes (Signed)
Lab called to attempt blood draw

## 2017-08-10 NOTE — ED Notes (Signed)
IV attempted x 1 by this RN unsuccessful, x2 by Caleen Jobshristine M, RN unsuccessful and x 1 by Piedad Climesavid W, RN unsuccessful.

## 2017-08-10 NOTE — BH Assessment (Signed)
Writer informed psychiatrist (Dr. Toni Amendlapacs) of consult requested.

## 2017-08-10 NOTE — ED Triage Notes (Signed)
EMS pt to rm 26 home with report of withdrawal from percocet and suboxone. Pt reports she went to the ER at [facility] yesterday and was given librium. Pt today is having a headache and states I'm going through withdrawals.

## 2017-08-10 NOTE — Progress Notes (Signed)
[facility] [place] at [facility]   PATIENT NAME: Stephanie RinksDixie Skinner    MR#:  [id number]  DATE OF BIRTH:  09/07/1955  SUBJECTIVE:   Patient here due to intractable nausea and vomiting and noted to be hyponatremic. Suspected to have benzodiazepine withdrawal.  REVIEW OF SYSTEMS:    Review of Systems  Constitutional: Negative for chills and fever.  HENT: Negative for congestion and tinnitus.   Eyes: Negative for blurred vision and double vision.  Respiratory: Negative for cough, shortness of breath and wheezing.   Cardiovascular: Negative for chest pain, orthopnea and PND.  Gastrointestinal: Positive for nausea and vomiting. Negative for abdominal pain and diarrhea.  Genitourinary: Negative for dysuria and hematuria.  Neurological: Negative for dizziness, sensory change and focal weakness.  Psychiatric/Behavioral: The patient is nervous/anxious.   All other systems reviewed and are negative.   Nutrition: Regular Tolerating Diet: Yes Tolerating PT: Await Eval.    DRUG ALLERGIES:   Allergies  Allergen Reactions  . Codeine Nausea And Vomiting  . Morphine And Related Other (See Comments)    seizures  . Sulfa Antibiotics Nausea And Vomiting    VITALS:  Blood pressure (!) 148/69, pulse 81, temperature 98.3 F (36.8 C), temperature source Oral, resp. rate 20, height 5\' 6"  (1.676 m), weight 65 kg (143 lb 5 oz), SpO2 97 %.  PHYSICAL EXAMINATION:   Physical Exam  GENERAL:  61 y.o.-year-old patient lying in bed in no acute distress.  EYES: Pupils equal, round, reactive to light and accommodation. No scleral icterus. Extraocular muscles intact.  HEENT: Head atraumatic, normocephalic. Oropharynx and nasopharynx clear.  NECK:  Supple, no jugular venous distention. No thyroid enlargement, no tenderness.  LUNGS: Normal breath sounds bilaterally, no wheezing, rales, rhonchi. No use of accessory muscles of respiration.  CARDIOVASCULAR: S1, S2 normal. No murmurs, rubs, or  gallops.  ABDOMEN: Soft, nontender, nondistended. Bowel sounds present. No organomegaly or mass.  EXTREMITIES: No cyanosis, clubbing or edema b/l.    NEUROLOGIC: Cranial nerves II through XII are intact. No focal Motor or sensory deficits b/l. + Tremor   PSYCHIATRIC: The patient is alert and oriented x 3. Anxious SKIN: No obvious rash, lesion, or ulcer.    LABORATORY PANEL:   CBC Recent Labs  Lab 08/10/17 0248  WBC 6.7  HGB 15.3  HCT 43.5  PLT 168   ------------------------------------------------------------------------------------------------------------------  Chemistries  Recent Labs  Lab 08/10/17 0248 08/10/17 0446  NA 122*  --   K 3.6  --   CL 84*  --   CO2 23  --   GLUCOSE 135*  --   BUN 6  --   CREATININE 0.50  --   CALCIUM 9.5  --   MG  --  1.7   ------------------------------------------------------------------------------------------------------------------  Cardiac Enzymes Recent Labs  Lab 08/10/17 0446  TROPONINI <0.03   ------------------------------------------------------------------------------------------------------------------  RADIOLOGY:  No results found.   ASSESSMENT AND PLAN:   61 year old female with past medical history of anxiety, hypertension, substance abuse, previous history of hyponatremia presents to the hospital due to nausea vomiting and noted to be hyponatremic.  1. Acute hyponatremia-secondary to persistent nausea vomiting and likely hypovolemic in nature. -Continue supportive care with IV fluids, follow sodium. Continue supportive care treatment underlying nausea vomiting with antiemetics.  2. Nausea/vomiting-secondary to benzodiazepine withdrawal. Patient apparently ran out of her Xanax. I'll resume her Xanax, continue supportive care with IV fluids, anti-emetics.  - follow clinically.   3. Benzodiazepine withdrawal-patient's low-dose Xanax has been resumed.  - will  get Psych consult.   4. Essential HTN - cont.  Propranolol.     All the records are reviewed and case discussed with Care Management/Social Worker. Management plans discussed with the patient, family and they are in agreement.  CODE STATUS: Full code  DVT Prophylaxis: Lovenox  TOTAL TIME TAKING CARE OF THIS PATIENT: 30 minutes.   POSSIBLE D/C IN 1-2 DAYS, DEPENDING ON CLINICAL CONDITION.   [place] SirenSAINANI,Aarushi Hemric J M.D on 08/10/2017 at 11:38 AM  Between 7am to 6pm - Pager - [phone]  After 6pm go to [email or web address] - Social research officer, [email or web address] [email or web address]  [facility] [place] [facility]  Office  [phone]  CC: Primary care physician; Patient, No Pcp Per
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
## 2017-08-10 NOTE — Progress Notes (Signed)
Pt requested something for withdrawal from percocet. Dr notified. No orders given at this time.

## 2017-08-10 NOTE — H&P (Addendum)
Stephanie Skinner is an 61 y.o. female.   Chief Complaint: Withdrawal HPI: The patient with past medical history of seizures and hypertension presents to the emergency department complaining of nausea and weakness.  The patient states that she has had nausea and vomiting for a number of days.  Notably this coincides with the time during which her the Xanax prescription for the month has run out.  The patient was seen at [facility] emergency department yesterday and prescribed Librium presumably for withdrawal symptoms.  She states that this medication gives her a headache and she refuses to take it.  The patient has multiple complaints in the emergency department with this encounter.  However, her chief complaint is essentially nausea.  Laboratory evaluation also revealed significant hyponatremia which prompted the emergency department staff to call the hospitalist service for admission.  Past Medical History:  Diagnosis Date  . Arthritis   . Hepatitis C    pt states she has been cured  . Hypertension   . Seizures ([place])     Past Surgical History:  Procedure Laterality Date  . ABDOMINAL HYSTERECTOMY     "partial hysterectomy"  . ELBOW SURGERY      Family History  Problem Relation Age of Onset  . Diabetes Sister   . Colon cancer Sister    Social History:  reports that she has been smoking cigarettes.  She has been smoking about 1.00 pack per day. she has never used smokeless tobacco. She reports that she drinks alcohol. She reports that she does not use drugs.  Allergies:  Allergies  Allergen Reactions  . Codeine Nausea And Vomiting  . Morphine And Related Other (See Comments)    seizures  . Sulfa Antibiotics Nausea And Vomiting     (Not in a hospital admission)  Results for orders placed or performed during the hospital encounter of 08/10/17 (from the past 48 hour(s))  CBC     Status: None   Collection Time: 08/10/17  2:48 AM  Result Value Ref Range   WBC 6.7 3.6 - 11.0  K/uL   RBC 4.57 3.80 - 5.20 MIL/uL   Hemoglobin 15.3 12.0 - 16.0 g/dL   HCT 43.5 35.0 - 47.0 %   MCV 95.1 80.0 - 100.0 fL   MCH 33.4 26.0 - 34.0 pg   MCHC 35.2 32.0 - 36.0 g/dL   RDW 12.1 11.5 - 14.5 %   Platelets 168 150 - 440 K/uL  Basic metabolic panel     Status: Abnormal   Collection Time: 08/10/17  2:48 AM  Result Value Ref Range   Sodium 122 (L) 135 - 145 mmol/L   Potassium 3.6 3.5 - 5.1 mmol/L   Chloride 84 (L) 101 - 111 mmol/L   CO2 23 22 - 32 mmol/L   Glucose, Bld 135 (H) 65 - 99 mg/dL   BUN 6 6 - 20 mg/dL   Creatinine, Ser 0.50 0.44 - 1.00 mg/dL   Calcium 9.5 8.9 - 10.3 mg/dL   GFR calc non Af Amer >60 >60 mL/min   GFR calc Af Amer >60 >60 mL/min    Comment: (NOTE) The eGFR has been calculated using the CKD EPI equation. This calculation has not been validated in all clinical situations. eGFR's persistently <60 mL/min signify possible Chronic Kidney Disease.    Anion gap 15 5 - 15   No results found.  Review of Systems  Constitutional: Negative for chills and fever.  HENT: Negative for sore throat and tinnitus.  Eyes: Negative for blurred vision and redness.  Respiratory: Negative for cough and shortness of breath.   Cardiovascular: Negative for chest pain, palpitations, orthopnea and PND.  Gastrointestinal: Positive for abdominal pain and vomiting. Negative for diarrhea and nausea.  Genitourinary: Negative for dysuria, frequency and urgency.  Musculoskeletal: Negative for joint pain and myalgias.  Skin: Negative for rash.       No lesions  Neurological: Positive for headaches. Negative for speech change, focal weakness and weakness.  Endo/Heme/Allergies: Does not bruise/bleed easily.       No temperature intolerance  Psychiatric/Behavioral: Negative for depression and suicidal ideas.    Blood pressure 117/82, pulse 91, temperature 98.2 F (36.8 C), temperature source Oral, resp. rate 16, height _0  (1.676 m), weight 61.2 kg (135 lb), SpO2 94  %. Physical Exam  Vitals reviewed. Constitutional: She is oriented to person, place, and time. She appears well-developed and well-nourished. No distress.  HENT:  Head: Normocephalic and atraumatic.  Mouth/Throat: Oropharynx is clear and moist.  Eyes: Conjunctivae and EOM are normal. Pupils are equal, round, and reactive to light. No scleral icterus.  Neck: Normal range of motion. Neck supple. No JVD present. No tracheal deviation present. No thyromegaly present.  Cardiovascular: Normal rate, regular rhythm and normal heart sounds. Exam reveals no gallop and no friction rub.  No murmur heard. Respiratory: Effort normal and breath sounds normal.  GI: Soft. Bowel sounds are normal. She exhibits no distension. There is no tenderness.  Genitourinary:  Genitourinary Comments: Deferred  Musculoskeletal: Normal range of motion. She exhibits no edema.  Lymphadenopathy:    She has no cervical adenopathy.  Neurological: She is alert and oriented to person, place, and time. No cranial nerve deficit. She exhibits normal muscle tone.  Skin: Skin is warm and dry. No rash noted. No erythema.  Psychiatric: She has a normal mood and affect. Her behavior is normal. Judgment and thought content normal.     Assessment/Plan This is a 61 year old female admitted for hyponatremia. 1.  Hyponatremia: Multifactorial; recurrent nausea vomiting versus psychogenic.  Likely a combination of both.  Encourage p.o. intake.  Hydrate with intravenous fluid.  Check sodium every 12 hours to monitor rise. 2.  Vomiting: Etiology does not appear infectious.  Possibly related to benzodiazepine withdrawal or organic psychiatric illness. 3.  Hypertension: Controlled after clonidine with subsequent dosing of Ativan in the emergency department.  Continue to monitor.  Continue propranolol. 4.  Anxiety: Part of the patient's overarching psychiatric issues.  The patient may have misused her prescription and has exhausted her supply of  Xanax.  She also has not used the Librium prescribed yesterday at [facility].  Use anxiolytics cautiously.  The patient has not had seizure activity will place her on a heart monitor to monitor for possible seizure. 5.  DVT prophylaxis: Lovenox 6.  GI prophylaxis: None The patient is a full code.  Time spent on admission orders and patient care approximately 45 minutes  Harrie Foreman, MD 08/10/2017, 4:39 AM

## 2017-08-10 NOTE — ED Provider Notes (Signed)
[facility] Emergency Department Provider Note   ____________________________________________   First MD Initiated Contact with Patient 08/10/17 0151     (approximate)  I have reviewed the triage vital signs and the nursing notes.   HISTORY  Chief Complaint Withdrawal   HPI Stephanie Santiago GladG Fullilove is a 61 y.o. female here for evaluation of nausea vomiting loose stools and withdrawals.  The patient reports that she is withdrawing from coming off of Xanax about 2-3 days ago which was abruptly discontinued, reports she was not able to get a new prescription.  Reports since that time has had nausea vomiting loose stools, also developing headaches.  [facility], given a prescription for Librium but she reports that she thinks it is making her nausea and headache seemingly worse and having some tremulousness.  No seizure confusion.  She also reports feeling like she is having muscle cramps in her calves and lower legs for the last 12 hours.  No fevers or chills.  No chest pain or trouble breathing.  Reports a mild throbbing headache throughout, reports she has had the same in the past did have some as well [place].    Past Medical History:  Diagnosis Date  . Arthritis   . Hepatitis C    pt states she has been cured  . Hypertension   . Seizures [facility](HCC)     Patient Active Problem List   Diagnosis Date Noted  . Dehydration 07/20/2017  . Hyponatremia 07/18/2017    Past Surgical History:  Procedure Laterality Date  . ABDOMINAL HYSTERECTOMY     "partial hysterectomy"  . ELBOW SURGERY      Prior to Admission medications   Medication Sig Start Date End Date Taking? Authorizing Provider  ALPRAZolam Prudy Feeler(XANAX) 1 MG tablet Take 1 tablet by mouth 2 (two) times daily as needed. 07/14/17   [provider]  cetirizine (ZYRTEC) 10 MG tablet Take 10 mg by mouth daily. 07/09/17   [provider]  oxyCODONE-acetaminophen  (PERCOCET) 10-325 MG tablet Take 1 tablet by mouth every 6 (six) hours as needed for pain.    [provider]  propranolol ER (INDERAL LA) 60 MG 24 hr capsule Take 1 capsule (60 mg total) by mouth daily. 07/20/17   Altamese DillingVachhani, Vaibhavkumar, MD  traZODone (DESYREL) 50 MG tablet Take 1 tablet by mouth at bedtime. 07/14/17   [provider]  Vitamin D, Ergocalciferol, (DRISDOL) 50000 units CAPS capsule Take 1 capsule by mouth once a week. 07/09/17   [provider]    Allergies Codeine; Morphine and related; and Sulfa antibiotics  Family History  Problem Relation Age of Onset  . Diabetes Sister   . Colon cancer Sister     Social History Social History   Tobacco Use  . Smoking status: Current Every Day Smoker    Packs/day: 1.00    Types: Cigarettes  . Smokeless tobacco: Never Used  Substance Use Topics  . Alcohol use: Yes    Comment: Pt states "couple glasses of wine a week"  . Drug use: No    Review of Systems Constitutional: No fever/chills Eyes: No visual changes. ENT: No sore throat. Cardiovascular: Denies chest pain. Respiratory: Denies shortness of breath. Gastrointestinal: No abdominal pain.  Loose stool, again she reports that it does not hurt in her stomach is not hurting her. Genitourinary: Negative for dysuria. Musculoskeletal: Negative for back pain.  Reports muscle cramps in her calves. Skin: Negative for  rash. Neurological: Negative for  focal weakness or numbness.  Some tremulousness   ____________________________________________   PHYSICAL EXAM:  VITAL SIGNS: ED Triage Vitals  Enc Vitals Group     BP 08/10/17 0147 (!) 199/110     Pulse Rate 08/10/17 0147 96     Resp 08/10/17 0147 16     Temp 08/10/17 0147 98.2 F (36.8 C)     Temp Source 08/10/17 0147 Oral     SpO2 08/10/17 0147 98 %     Weight 08/10/17 0150 135 lb (61.2 kg)     Height 08/10/17 0150 5\' 6"  (1.676 m)     Head Circumference --      Peak Flow --      Pain  Score 08/10/17 0145 10     Pain Loc --      Pain Edu? --      Excl. in GC? --     Constitutional: Alert and oriented.  Mildly ill-appearing, fatigued but in no distress. Eyes: Conjunctivae are normal.  Midpoint pupils. Head: Atraumatic. Nose: No congestion/rhinnorhea. Mouth/Throat: Mucous membranes are slightly dry. Neck: No stridor.   Cardiovascular: Normal rate, regular rhythm. Grossly normal heart sounds.  Good peripheral circulation. Respiratory: Normal respiratory effort.  No retractions. Lungs CTAB. Gastrointestinal: Soft and nontender. No distention. Musculoskeletal: No lower extremity tenderness nor edema.  Moves all extremities with 5 out of 5 strength.  No pronator drift.  Mild tremulousness in the upper extremities bilaterally. Neurologic:  Normal speech and language. No gross focal neurologic deficits are appreciated.  Skin:  Skin is warm, dry and intact. No rash noted. Psychiatric: Mood and affect are slightly flat, denies any desire to harm self or others.  No hallucinations.Marland Kitchen. Speech and behavior are normal.  ____________________________________________   LABS (all labs ordered are listed, but only abnormal results are displayed)  Labs Reviewed  BASIC METABOLIC PANEL - Abnormal; Notable for the following components:      Result Value   Sodium 122 (*)    Chloride 84 (*)    Glucose, Bld 135 (*)    All other components within normal limits  CBC   ____________________________________________  EKG  2 AM Heart rate 95 QRS 90 QTC 500 Normal sinus rhythm, minimal prolongation of the QT interval, occasional PVC, nonspecific T wave depression seen in multiple leads ____________________________________________  RADIOLOGY   ____________________________________________   PROCEDURES  Procedure(s) performed: None  Procedures  Critical Care performed: No  ____________________________________________   INITIAL IMPRESSION / ASSESSMENT AND PLAN / ED  COURSE  Pertinent labs & imaging results that were available during my care of the patient were reviewed by me and considered in my medical decision making (see chart for details).  Patient presents for evaluation of "withdrawals".  She does give a history that could represent benzodiazepine withdrawal reporting nausea vomiting tremulousness, loose stools.  [facility] given Librium, and I doubt she had a reaction to Librium rather it sounds like her withdrawal symptoms continue to be present.  She is notably hypertensive on arrival as well.  She reports a nonfocal throbbing headache, no associated acute neurologic deficits.  No chest pain or trouble breathing, but does report feeling like she is having a hard time staying hydrated occasionally vomiting nonbloody, and also having occasional loose stools with decreased oral intake.  ----------------------------------------- 4:33 AM on 08/10/2017 -----------------------------------------  Notable hyponatremia.  Her vital signs of improved after clonidine and Ativan her tremulousness is improved her headache she reports is  essentially gone, but continues to feel cramping discomfort in her calves.  Appears likely due to dehydration, in light of her previous admission I suspect she is probably volume depleted causing hyponatremia.  We will start her with a small fluid bolus of 500 mL's normal saline, discussed with the patient will admit her to the hospital due to treatment of significant hyponatremia associated with her withdrawals fatigue and muscle cramps.  She does not appear that she would be safe for discharge especially in light of review of labs that showed downtrend in her sodium from her previous comparison at [facility].  She had a urinalysis done [facility] that was reassuring labs from [facility] as well as the chart from [facility] yesterday as well.  Discussed case with admitting doctor Dr. Sheryle Hail, agreeable with plan to admit       ____________________________________________   FINAL CLINICAL IMPRESSION(S) / ED DIAGNOSES  Final diagnoses:  Hyponatremia  Benzodiazepine withdrawal with complication (HCC)      NEW MEDICATIONS STARTED DURING THIS VISIT:  This SmartLink is deprecated. Use AVSMEDLIST instead to display the medication list for a patient.   Note:  This document was prepared using Dragon voice recognition software and may include unintentional dictation errors.     Sharyn Creamer, MD 08/10/17 [phone]

## 2017-08-11 LAB — BASIC METABOLIC PANEL
Anion gap: 5 (ref 5–15)
BUN: 6 mg/dL (ref 6–20)
CHLORIDE: 110 mmol/L (ref 101–111)
CO2: 23 mmol/L (ref 22–32)
Calcium: 8.6 mg/dL — ABNORMAL LOW (ref 8.9–10.3)
Creatinine, Ser: 0.64 mg/dL (ref 0.44–1.00)
GFR calc non Af Amer: >60 mL/min (ref >60)
Glucose, Bld: 104 mg/dL — ABNORMAL HIGH (ref 65–99)
POTASSIUM: 3.4 mmol/L — AB (ref 3.5–5.1)
SODIUM: 138 mmol/L (ref 135–145)

## 2017-08-11 LAB — SODIUM: Sodium: 139 mmol/L (ref 135–145)

## 2017-08-11 MED ORDER — PROMETHAZINE HCL 25 MG PO TABS: 25.0000 mg | ORAL_TABLET | Freq: Four times a day (QID) | ORAL | 0 refills | Status: DC | PRN

## 2017-08-11 NOTE — Progress Notes (Signed)
Pt has been discharged home. Discharge papers given and explained to pt. Pt verbalized understanding. Meds reviewed with pt. RX given. Escorted in a wheelchair.

## 2017-08-11 NOTE — Discharge Summary (Signed)
[facility] [place] [facility]   PATIENT NAME: Stephanie Skinner    MR#:  [id number]  DATE OF BIRTH:  07/31/1956  DATE OF ADMISSION:  08/10/2017 ADMITTING PHYSICIAN: Arnaldo NatalMichael S Diamond, MD  DATE OF DISCHARGE: 08/11/2017 10:45 AM  PRIMARY CARE PHYSICIAN: Patient, No Pcp Per    ADMISSION DIAGNOSIS:  Hyponatremia [E87.1] Benzodiazepine withdrawal with complication (HCC) [F13.239]  DISCHARGE DIAGNOSIS:  Principal Problem:   Hyponatremia Active Problems:   Generalized anxiety disorder   Benzodiazepine abuse (HCC)   Opiate abuse, continuous (HCC)   SECONDARY DIAGNOSIS:   Past Medical History:  Diagnosis Date  . Arthritis   . Hepatitis C    pt states she has been cured  . Hypertension   . Seizures [facility](HCC)     HOSPITAL COURSE:   61 year old female with past medical history of anxiety, hypertension, substance abuse, previous history of hyponatremia presents to the hospital due to nausea vomiting and noted to be hyponatremic.  1. Acute hyponatremia-secondary to persistent nausea vomiting and likely hypovolemic in nature. -Patient was hydrated with IV fluids and patient's sodium is not improved and normalized. She presently denies any nausea or vomiting and is tolerating some by mouth well.  2. Nausea/vomiting-secondary to benzodiazepine withdrawal. Patient apparently ran out of her Xanax. -Patient was given some IV antiemetics and IV fluids and nausea vomiting has improved. She was given a prescription for Phenergan upon discharge.  3. Benzodiazepine withdrawal-patient's low-dose Xanax was resumed while in the hospital.  - A psychiatric consult was obtained and they recommended continuing patient's benzodiazepines were all in the hospital but not to give her a prescription upon discharge. Patient was told to follow-up with her psychiatrist Dr. Fannie KneeSue upon discharge.   4. Essential HTN - she will cont. Propranolol.     DISCHARGE CONDITIONS:   Stable.    CONSULTS OBTAINED:  Treatment Team:  Clapacs, Jackquline DenmarkJohn T, MD  DRUG ALLERGIES:   Allergies  Allergen Reactions  . Codeine Nausea And Vomiting  . Morphine And Related Other (See Comments)    seizures  . Sulfa Antibiotics Nausea And Vomiting    DISCHARGE MEDICATIONS:   Allergies as of 08/11/2017      Reactions   Codeine Nausea And Vomiting   Morphine And Related Other (See Comments)   seizures   Sulfa Antibiotics Nausea And Vomiting      Medication List    TAKE these medications   cetirizine 10 MG tablet Commonly known as:  ZYRTEC Take 10 mg by mouth daily.   chlordiazePOXIDE 25 MG capsule Commonly known as:  LIBRIUM Take 25 mg by mouth 3 (three) times daily as needed for anxiety.   promethazine 25 MG tablet Commonly known as:  PHENERGAN Take 1 tablet (25 mg total) by mouth every 6 (six) hours as needed for nausea or vomiting.   propranolol ER 60 MG 24 hr capsule Commonly known as:  INDERAL LA Take 1 capsule (60 mg total) by mouth daily.         DISCHARGE INSTRUCTIONS:   DIET:  Regular diet  DISCHARGE CONDITION:  Stable  ACTIVITY:  Activity as tolerated  OXYGEN:  Home Oxygen: No.   Oxygen Delivery: room air  DISCHARGE LOCATION:  home   If you experience worsening of your admission symptoms, develop shortness of breath, life threatening emergency, suicidal or homicidal thoughts you must seek medical attention immediately by calling 911 or calling your MD immediately  if symptoms less severe.  You Must read complete instructions/literature along  with all the possible adverse reactions/side effects for all the Medicines you take and that have been prescribed to you. Take any new Medicines after you have completely understood and accpet all the possible adverse reactions/side effects.   Please note  You were cared for by a hospitalist during your hospital stay. If you have any questions about your discharge medications or the care you received while  you were in the hospital after you are discharged, you can call the unit and asked to speak with the hospitalist on call if the hospitalist that took care of you is not available. Once you are discharged, your primary care physician will handle any further medical issues. Please note that NO REFILLS for any discharge medications will be authorized once you are discharged, as it is imperative that you return to your primary care physician (or establish a relationship with a primary care physician if you do not have one) for your aftercare needs so that they can reassess your need for medications and monitor your lab values.     Today   Still having some N/V but sodium normalized.  No other complaints.  Will d/c home today and to follow up with Psych.   VITAL SIGNS:  Blood pressure (!) 150/66, pulse 69, temperature 98.6 F (37 C), temperature source Oral, resp. rate 18, height 5\' 6"  (1.676 m), weight 68.2 kg (150 lb 4 oz), SpO2 99 %.  I/O:    Intake/Output Summary (Last 24 hours) at 08/11/2017 1348 Last data filed at 08/11/2017 1047 Gross per 24 hour  Intake 1077.08 ml  Output -  Net 1077.08 ml    PHYSICAL EXAMINATION:   GENERAL:  61 y.o.-year-old patient lying in bed in no acute distress.  EYES: Pupils equal, round, reactive to light and accommodation. No scleral icterus. Extraocular muscles intact.  HEENT: Head atraumatic, normocephalic. Oropharynx and nasopharynx clear.  NECK:  Supple, no jugular venous distention. No thyroid enlargement, no tenderness.  LUNGS: Normal breath sounds bilaterally, no wheezing, rales, rhonchi. No use of accessory muscles of respiration.  CARDIOVASCULAR: S1, S2 normal. No murmurs, rubs, or gallops.  ABDOMEN: Soft, nontender, nondistended. Bowel sounds present. No organomegaly or mass.  EXTREMITIES: No cyanosis, clubbing or edema b/l.    NEUROLOGIC: Cranial nerves II through XII are intact. No focal Motor or sensory deficits b/l. + Tremor   PSYCHIATRIC:  The patient is alert and oriented x 3. Anxious SKIN: No obvious rash, lesion, or ulcer.   DATA REVIEW:   CBC Recent Labs  Lab 08/10/17 0248  WBC 6.7  HGB 15.3  HCT 43.5  PLT 168    Chemistries  Recent Labs  Lab 08/10/17 0446  08/11/17 0406 08/11/17 0951  NA  --    < > 138 139  K  --   --  3.4*  --   CL  --   --  110  --   CO2  --   --  23  --   GLUCOSE  --   --  104*  --   BUN  --   --  6  --   CREATININE  --   --  0.64  --   CALCIUM  --   --  8.6*  --   MG 1.7  --   --   --    < > = values in this interval not displayed.    Cardiac Enzymes Recent Labs  Lab 08/10/17 0446  TROPONINI <0.03    Microbiology Results  No results found for this or any previous visit.  RADIOLOGY:  No results found.    Management plans discussed with the patient, family and they are in agreement.  CODE STATUS:     Code Status Orders  (From admission, onward)        Start     Ordered   08/10/17 0600  Full code  Continuous     08/10/17 0559    Code Status History    Date Active Date Inactive Code Status Order ID Comments User Context   07/18/2017 10:12 07/20/2017 13:35 Full Code [id number]  Altamese Dilling, MD ED      TOTAL TIME TAKING CARE OF THIS PATIENT: 40 minutes.    Houston Siren M.D on 08/11/2017 at 1:48 PM  Between 7am to 6pm - Pager - [phone]  After 6pm go to [email or web address] - Social research officer, government  [facility] [place] [facility]  Office  [phone]  CC: Primary care physician; Patient, No Pcp Per

## 2022-02-12 ENCOUNTER — Emergency Department
Admission: EM | Admit: 2022-02-12 | Discharge: 2022-02-12 | Disposition: A | Discharge status: Home / Self Care | Payer: Medicare Other | Attending: Emergency Medicine | Admitting: Emergency Medicine

## 2022-02-12 ENCOUNTER — Other Ambulatory Visit: Payer: Self-pay

## 2022-02-12 ENCOUNTER — Emergency Department: Payer: Medicare Other

## 2022-02-12 DIAGNOSIS — L03115 Cellulitis of right lower limb: Secondary | ICD-10-CM | POA: Diagnosis present

## 2022-02-12 DIAGNOSIS — R413 Other amnesia: Secondary | ICD-10-CM | POA: Diagnosis present

## 2022-02-12 DIAGNOSIS — F1910 Other psychoactive substance abuse, uncomplicated: Secondary | ICD-10-CM | POA: Diagnosis not present

## 2022-02-12 DIAGNOSIS — F10288 Alcohol dependence with other alcohol-induced disorder: Secondary | ICD-10-CM | POA: Diagnosis not present

## 2022-02-12 DIAGNOSIS — Y900 Blood alcohol level of less than 20 mg/100 ml: Secondary | ICD-10-CM | POA: Insufficient documentation

## 2022-02-12 DIAGNOSIS — F1010 Alcohol abuse, uncomplicated: Secondary | ICD-10-CM

## 2022-02-12 LAB — URINE DRUG SCREEN, QUALITATIVE (ARMC ONLY)
Amphetamines, Ur Screen: NOT DETECTED
Barbiturates, Ur Screen: NOT DETECTED
Benzodiazepine, Ur Scrn: POSITIVE — AB
Cannabinoid 50 Ng, Ur ~~LOC~~: NOT DETECTED
Cocaine Metabolite,Ur ~~LOC~~: NOT DETECTED
MDMA (Ecstasy)Ur Screen: NOT DETECTED
Methadone Scn, Ur: NOT DETECTED
Opiate, Ur Screen: NOT DETECTED
Phencyclidine (PCP) Ur S: NOT DETECTED
Tricyclic, Ur Screen: NOT DETECTED

## 2022-02-12 LAB — CBC WITH DIFFERENTIAL/PLATELET
Abs Immature Granulocytes: 0.04 10*3/uL (ref 0.00–0.07)
Basophils Absolute: 0 10*3/uL (ref 0.0–0.1)
Basophils Relative: 0 %
Eosinophils Absolute: 0 10*3/uL (ref 0.0–0.5)
Eosinophils Relative: 0 %
HCT: 40.6 % (ref 36.0–46.0)
Hemoglobin: 13.2 g/dL (ref 12.0–15.0)
Immature Granulocytes: 1 %
Lymphocytes Relative: 24 %
Lymphs Abs: 1.7 10*3/uL (ref 0.7–4.0)
MCH: 31.2 pg (ref 26.0–34.0)
MCHC: 32.5 g/dL (ref 30.0–36.0)
MCV: 96 fL (ref 80.0–100.0)
Monocytes Absolute: 0.5 10*3/uL (ref 0.1–1.0)
Monocytes Relative: 7 %
Neutro Abs: 4.7 10*3/uL (ref 1.7–7.7)
Neutrophils Relative %: 68 %
Platelets: 252 10*3/uL (ref 150–400)
RBC: 4.23 MIL/uL (ref 3.87–5.11)
RDW: 12.6 % (ref 11.5–15.5)
WBC: 7 10*3/uL (ref 4.0–10.5)
nRBC: 0 % (ref 0.0–0.2)

## 2022-02-12 LAB — COMPREHENSIVE METABOLIC PANEL
ALT: 16 U/L (ref 0–44)
AST: 25 U/L (ref 15–41)
Albumin: 3.9 g/dL (ref 3.5–5.0)
Alkaline Phosphatase: 91 U/L (ref 38–126)
Anion gap: 9 (ref 5–15)
BUN: 9 mg/dL (ref 8–23)
CO2: 28 mmol/L (ref 22–32)
Calcium: 9.8 mg/dL (ref 8.9–10.3)
Chloride: 102 mmol/L (ref 98–111)
Creatinine, Ser: 0.53 mg/dL (ref 0.44–1.00)
GFR, Estimated: >60 mL/min (ref >60)
Glucose, Bld: 162 mg/dL — ABNORMAL HIGH (ref 70–99)
Potassium: 3.6 mmol/L (ref 3.5–5.1)
Sodium: 139 mmol/L (ref 135–145)
Total Bilirubin: 0.7 mg/dL (ref 0.3–1.2)
Total Protein: 8.3 g/dL — ABNORMAL HIGH (ref 6.5–8.1)

## 2022-02-12 LAB — TROPONIN I (HIGH SENSITIVITY)
Troponin I (High Sensitivity): 6 ng/L (ref <18)
Troponin I (High Sensitivity): 5 ng/L (ref <18)

## 2022-02-12 LAB — AMMONIA: Ammonia: 15 umol/L (ref 9–35)

## 2022-02-12 LAB — ETHANOL: Alcohol, Ethyl (B): <10 mg/dL (ref <10)

## 2022-02-12 LAB — MAGNESIUM: Magnesium: 2.1 mg/dL (ref 1.7–2.4)

## 2022-02-12 LAB — CK: Total CK: 80 U/L (ref 38–234)

## 2022-02-12 MED ORDER — LORAZEPAM 2 MG/ML IJ SOLN
0.5000 mg | Freq: Once | INTRAMUSCULAR | Status: AC
Start: 2022-02-12 — End: 2022-02-12
  Administered 2022-02-12: 0.5 mg via INTRAVENOUS
  Filled 2022-02-12: qty 1

## 2022-02-12 MED ORDER — LORAZEPAM 2 MG PO TABS: 0.0000 mg | ORAL_TABLET | Freq: Four times a day (QID) | ORAL | Status: DC

## 2022-02-12 MED ORDER — CLONIDINE HCL 0.1 MG PO TABS
0.1000 mg | ORAL_TABLET | Freq: Once | ORAL | Status: AC
  Administered 2022-02-12: 0.1 mg via ORAL
  Filled 2022-02-12: qty 1

## 2022-02-12 MED ORDER — SODIUM CHLORIDE 0.9 % IV BOLUS
1000.0000 mL | Freq: Once | INTRAVENOUS | Status: AC
  Administered 2022-02-12: 1000 mL via INTRAVENOUS

## 2022-02-12 MED ORDER — LORAZEPAM 2 MG/ML IJ SOLN: 0.0000 mg | Freq: Two times a day (BID) | INTRAMUSCULAR | Status: DC

## 2022-02-12 MED ORDER — CHLORDIAZEPOXIDE HCL 25 MG PO CAPS: ORAL_CAPSULE | ORAL | 0 refills | Status: AC

## 2022-02-12 MED ORDER — ONDANSETRON HCL 4 MG/2ML IJ SOLN
4.0000 mg | Freq: Once | INTRAMUSCULAR | Status: AC
Start: 2022-02-12 — End: 2022-02-12
  Administered 2022-02-12: 4 mg via INTRAVENOUS
  Filled 2022-02-12: qty 2

## 2022-02-12 MED ORDER — CEPHALEXIN 500 MG PO CAPS: 500.0000 mg | ORAL_CAPSULE | Freq: Two times a day (BID) | ORAL | 0 refills | Status: AC

## 2022-02-12 MED ORDER — LORAZEPAM 2 MG/ML IJ SOLN
0.5000 mg | Freq: Once | INTRAMUSCULAR | Status: AC
  Administered 2022-02-12: 0.5 mg via INTRAVENOUS
  Filled 2022-02-12: qty 1

## 2022-02-12 MED ORDER — LORAZEPAM 2 MG PO TABS: 0.0000 mg | ORAL_TABLET | Freq: Two times a day (BID) | ORAL | Status: DC

## 2022-02-12 MED ORDER — LORAZEPAM 2 MG/ML IJ SOLN: 0.0000 mg | Freq: Four times a day (QID) | INTRAMUSCULAR | Status: DC

## 2022-02-12 MED ORDER — THIAMINE HCL 100 MG/ML IJ SOLN
100.0000 mg | Freq: Once | INTRAMUSCULAR | Status: AC
  Administered 2022-02-12: 100 mg via INTRAVENOUS
  Filled 2022-02-12: qty 2

## 2022-02-12 NOTE — ED Triage Notes (Signed)
Pt reports having withdrawal symptoms from alcohol and suboxone. Pt reports her last drink was last night and she last took suboxone 2 days ago. Pt states she drinks 2 "tall boys" daily.

## 2022-02-12 NOTE — BH Assessment (Signed)
Comprehensive Clinical Assessment (CCA) Screening, Triage and Referral Note  02/12/2022 Stephanie Skinner [id number]  [facility], 66 year old emale who presents to [facility] ED involuntarily for treatment. Per triage note, Pt reports having withdrawal symptoms from alcohol and suboxone. Pt reports her last drink was last night and she last took suboxone 2 days ago. Pt states she drinks 2 "tall boys" daily.   During TTS assessment pt presents alert and oriented x 4, restless but cooperative, and mood-congruent with affect. The pt does not appear to be responding to internal or external stimuli. Neither is the pt presenting with any delusional thinking. Pt verified the information provided to triage RN.   Pt identifies her main complaint to be that she is having withdrawal symptoms from opiates. Patient reports having cold chills and nausea. Patient states she is prescribed Suboxone and used her last half dose on Saturday. Patient reports she has a scheduled visit to see her psychiatrist, Dr. Melton Alar on 02/16/22 and will get a refill on her prescription at that time. Patient also states she is taking Xanax. Patient declined to receive any additional support. Pt denies current SI/HI/AH/VH. Pt contracts for safety.    Writer spoke with ED provider, Dr. Fuller Plan, and when medically cleared, patient will be discharged.  Chief Complaint:  Chief Complaint  Patient presents with   Withdrawal    Pt reports having withdrawal symptoms from alcohol and suboxone. Pt reports her last drink was last night and she last took suboxone 2 days ago. Pt states she drinks 2 "tall boys" daily.    Visit Diagnosis: Anxiety  Patient Reported Information How did you hear about [place]? Self  What Is the Reason for Your Visit/Call Today? Patient came to the ED due to withdrawal symptoms.  How Long Has This Been Causing You Problems? <Week  What Do You Feel Would Help You the Most Today? Medication(s)   Have You Recently Had Any  Thoughts About Hurting Yourself? No  Are You Planning to Commit Suicide/Harm Yourself At This time? No   Have you Recently Had Thoughts About Hurting Someone Karolee Ohs? No  Are You Planning to Harm Someone at This Time? No  Explanation: No data recorded  Have You Used Any Alcohol or Drugs in the Past 24 Hours? Yes  How Long Ago Did You Use Drugs or Alcohol? No data recorded What Did You Use and How Much? Alcohol-unknown   Do You Currently Have a Therapist/Psychiatrist? Yes  Name of Therapist/Psychiatrist: Dr. Melton Alar   Have You Been Recently Discharged From Any Office Practice or Programs? No  Explanation of Discharge From Practice/Program: No data recorded   CCA Screening Triage Referral Assessment Type of Contact: Face-to-Face  Telemedicine Service Delivery:   Is this Initial or Reassessment? No data recorded Date Telepsych consult ordered in [facility]:  No data recorded Time Telepsych consult ordered in [facility]:  No data recorded Location of Assessment: [facility] ED  Provider Location: [facility] ED   Collateral Involvement: None provided   Does Patient Have a Court Appointed Legal Guardian? No data recorded Name and Contact of Legal Guardian: No data recorded If Minor and Not Living with Parent(s), Who has Custody? n/a  Is CPS involved or ever been involved? Never  Is APS involved or ever been involved? Never   Patient Determined To Be At Risk for Harm To Self or Others Based on Review of Patient Reported Information or Presenting Complaint? No  Method: No data recorded Availability of Means: No data recorded Intent:  No data recorded Notification Required: No data recorded Additional Information for Danger to Others Potential: No data recorded Additional Comments for Danger to Others Potential: No data recorded Are There Guns or Other Weapons in Your Home? No data recorded Types of Guns/Weapons: No data recorded Are These Weapons Safely Secured?                            No data  recorded Who Could Verify You Are Able To Have These Secured: No data recorded Do You Have any Outstanding Charges, Pending Court Dates, Parole/Probation? No data recorded Contacted To Inform of Risk of Harm To Self or Others: No data recorded  Does Patient Present under Involuntary Commitment? No  IVC Papers Initial File Date: No data recorded  [place] of Residence: [place]   Patient Currently Receiving the Following Services: Medication Management   Determination of Need: Emergent (2 hours)   Options For Referral: ED Visit; Medication Management   Discharge Disposition:     Clerance Lav, Counselor, LCAS-A

## 2022-02-12 NOTE — Discharge Instructions (Addendum)
Do not take the Librium if you take it with any Xanax or alcohol as this can be life-threatening and lead to permanent disability or death.  However the Librium can help prevent withdrawals.  Your MRIs did show some compression fractures but given you are nontender do not feel you need to have a brace but you can follow-up with neurosurgery. If u develop worsening back pain, numbness or tingling one leg, difficulty urinating then return to the ED for evaluation.    Day 1: Take 50 mg (2 x 25 mg tablets) three times daily. Day 2: Take 50 (2 x 25 mg tablets) twice daily.  Day 3: Take 50 mg (2 x 25 mg tablets) once  Day 4: Take 25mg  (1x25mg ) once

## 2022-02-12 NOTE — TOC Initial Note (Signed)
Transition of Care [facility]) - Initial/Assessment Note    Patient Details  Name: Stephanie Skinner MRN: [id number] Date of Birth: [birth year]  Transition of Care [facility]) CM/SW Contact:    Allayne Butcher, RN Phone Number: 02/12/2022, 4:31 PM  Clinical Narrative:                 Patient came into the hospital for withdrawals.  TOC consult for substance abuse resources.  Patient does not need to be admitted and will discharge.  RNCM provided patient with list of resources for substance abuse treatment in [place] and [place].   TOC signed off.        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 [place] [address] Phone: [phone] Fax: [phone]  [id number] - [place], [place] - [address] [place] [address] Phone: [phone] Fax: [phone]     Social Determinants of Health (SDOH) Interventions    Readmission Risk Interventions     No data to display

## 2022-02-12 NOTE — ED Provider Notes (Addendum)
[facility] Provider Note    None    (approximate)   History   Withdrawal (Pt reports having withdrawal symptoms from alcohol and suboxone. Pt reports her last drink was last night and she last took suboxone 2 days ago. Pt states she drinks 2 "tall boys" daily. )   HPI  Stephanie Skinner is a 66 y.o. female who comes in with concern for withdrawal.  Patient reports that she last drank alcohol last night.  She reports that she typically drinks 2 tall boys daily.  She also reports that she gets Xanax from her doctor and that she last took 1 yesterday but ran out.  She also reports that she last took Suboxone 2 days ago that she gets illegally.  Patient seems confused and Pt is stating that she recently fell and that her medications came out and that is how she lost them.  When I questioned her further on exactly how she lost her medicine she was not able to give a great answer.  I reviewed the database she had 30 days of alprazolam last given in April.  Physical Exam   Triage Vital Signs: ED Triage Vitals [02/12/22 0807]  Enc Vitals Group     BP (!) 163/78     Pulse Rate 84     Resp 16     Temp      Temp Source Oral     SpO2 98 %     Weight      Height      Head Circumference      Peak Flow      Pain Score      Pain Loc      Pain Edu?      Excl. in GC?     Most recent vital signs: Vitals:   02/12/22 0807  BP: (!) 163/78  Pulse: 84  Resp: 16  SpO2: 98%     General: Awake, no distress.  CV:  Good peripheral perfusion.  Resp:  Normal effort.  Abd:  No distention.  Other:  Slight tremors noted to the hands and tongue. Patient is a little bit of bruising noted over her cheek as well as some bruising noted on her arms.  She is got full range of motion of her arms.  Patient has a little bit of redness noted on her right shin that she reports is gone down significantly since recently being started on doxycycline  ED Results / Procedures /  Treatments   Labs (all labs ordered are listed, but only abnormal results are displayed) Labs Reviewed  COMPREHENSIVE METABOLIC PANEL - Abnormal; Notable for the following components:      Result Value   Glucose, Bld 162 (*)    Total Protein 8.3 (*)    All other components within normal limits  URINE DRUG SCREEN, QUALITATIVE ([facility] ONLY) - Abnormal; Notable for the following components:   Benzodiazepine, Ur Scrn POSITIVE (*)    All other components within normal limits  CBC WITH DIFFERENTIAL/PLATELET  MAGNESIUM  ETHANOL  AMMONIA  CK  TROPONIN I (HIGH SENSITIVITY)  TROPONIN I (HIGH SENSITIVITY)     EKG  My interpretation of EKG:  Normal sinus rate of 84 without any ST elevation, no T wave inversions, QTc slightly prolonged at 522  Normal sinus rate of 80 without any ST elevation or T wave inversions, QTc 492    RADIOLOGY I have reviewed the CT head personally do not see any evidence  of intercranial hemorrhage  IMPRESSION: No acute intracranial abnormalities.   No acute facial bone abnormalities.   Superior endplate compression fractures of T1, T2 and T3 with minimal anterior height losses.   Additional lucency within base of odontoid and sclerosis within C4 vertebral, nonspecific.   Further assessment of cervical spine findings by MR imaging without contrast recommended.   PROCEDURES:  Critical Care performed: No  Procedures   MEDICATIONS ORDERED IN ED: Medications  LORazepam (ATIVAN) injection 0-4 mg ( Intravenous Not Given 02/12/22 1036)    Or  LORazepam (ATIVAN) tablet 0-4 mg ( Oral See Alternative 02/12/22 1036)  LORazepam (ATIVAN) injection 0-4 mg (has no administration in time range)    Or  LORazepam (ATIVAN) tablet 0-4 mg (has no administration in time range)  sodium chloride 0.9 % bolus 1,000 mL (0 mLs Intravenous Stopped 02/12/22 0941)  ondansetron (ZOFRAN) injection 4 mg (4 mg Intravenous Given 02/12/22 0825)  LORazepam (ATIVAN) injection 0.5 mg  (0.5 mg Intravenous Given 02/12/22 0826)  cloNIDine (CATAPRES) tablet 0.1 mg (0.1 mg Oral Given 02/12/22 0826)  thiamine (B-1) injection 100 mg (100 mg Intravenous Given 02/12/22 0826)  LORazepam (ATIVAN) injection 0.5 mg (0.5 mg Intravenous Given 02/12/22 0957)     IMPRESSION / MDM / ASSESSMENT AND PLAN / ED COURSE  I reviewed the triage vital signs and the nursing notes.   Patient's presentation is most consistent with acute presentation with potential threat to life or bodily function.   Differential includes Electra abnormalities, AKI, intracranial hemorrhage given some evidence of trauma on exam patient not the best historian.  Will get CT cervical and CT face.  Patient will be given some medications to help with potential withdrawal from Suboxone, alcohol.  CMP is reassuring.  Magnesium normal.  EtOH normal CK normal troponin negative  CT the head was questioning possible cervical, thoracic fracture so MRI ordered.  She got no weakness or numbness in her legs or arms to suggest cord compression.  On reassessment patient is feeling better.  TTS came and talked to patient about going to a facility to try to get help but patient is declining resources on detox.   I reviewed the database the patient did actually get a fill from Xanax about a month ago.  She is not due for another 4 days.  Patient reports that she is no longer any medications at home to use.  We discussed using a Librium taper but the dangers of this if she uses it with Xanax or alcohol.  She expressed understanding but at this time she reports that she is going to quit alcohol and not use any of the Xanax from anybody else and she will like to have the Librium taper.  She understands the risk for death or permanent disability if she uses in combination with Xanax or alcohol she denies any SI but is adamant that she needs something to help with the withdrawal therefore we will proceed with Librium taper  Patient handed off  pending MRI but suspect discharge home after this  Double check that librium does not increase qtc     IMPRESSION: 1. Unfortunately patient motion artifact moderately technically degrades this examination. 2. Within this limitation, the C4 vertebral body lesion is favored to be benign. 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 3. Mild anterior superior T3 vertebral body height loss. Motion artifact limits evaluation for possible marrow edema within the T3 vertebral body. It is difficult to exclude mild trace marrow edema however it is unclear  whether this vertebral body compression fracture is subacute to chronic. 4. Multilevel degenerative disc and joint changes as above. Mild C3-4, moderate C4-5 and mild-to-moderate C5-6 central canal stenosis. Multilevel neuroforaminal stenosis as above.      IMPRESSION: 1. Unfortunately, patient motion artifact moderately to markedly technically degrades this examination. 2. Mild anterior superior T3 vertebral body height loss. It is difficult to exclude very mild anterior T3 vertebral body marrow edema and this compression fracture may be subacute. 3. Likely acute to subacute mild compression fracture of the T8 vertebral body. 4. Moderate to high-grade posterior left L1-2 disc space narrowing and endplate marrow edema. The marrow edema may be secondary to the high-grade degenerative disc change, however cannot exclude an acute fracture in this region. No significant vertebral body height loss. 5. Multilevel degenerative disc and joint changes as above. Mild T10-11 central canal stenosis. Multilevel neuroforaminal stenosis as above.  Patient is not really tender over her CTL spine I initially ordered a CT cervical due to the concern that patient had EtOH, substances on board.  These MRI of the cervical area did not show any signs for fracture.  She does have some compression fractures of the T-spine area but she is nontender on that.  There are  some question of some marrow edema at L1-L2 but I palpated over this area and she is got no tenderness at this time.  Patient has been able to urinate has no numbness or weakness in her legs and I do not feel if she has any evidence of cord compression.  I discussed with Dr. Marcell Barlow and patient can follow-up outpatient for these findings.  She was able to walk without pain in her back.  Her more concern is just kind of the shaking and tremors which I written her a Librium taper for.  She has reassuring vital signs at this time and does not require admission to the hospital and declined [place] trying to get her into a facility for detox.  She does mention having of a little bit of a red cellulitis on her right leg that is been getting better on the doxycycline but still a little bit present.  We will add on Keflex to help cover strep.  The patient is on the cardiac monitor to evaluate for evidence of arrhythmia and/or significant heart rate changes.      FINAL CLINICAL IMPRESSION(S) / ED DIAGNOSES   Final diagnoses:  ETOH abuse  Substance abuse (HCC)  Cellulitis of right lower extremity     Rx / DC Orders   ED Discharge Orders          Ordered    chlordiazePOXIDE (LIBRIUM) 25 MG capsule  Multiple Frequencies        02/12/22 1451    cephALEXin (KEFLEX) 500 MG capsule  2 times daily        02/12/22 1527             Note:  This document was prepared using Dragon voice recognition software and may include unintentional dictation errors.   Concha Se, MD 02/12/22 1534    Concha Se, MD 02/12/22 [phone]

## 2022-04-26 ENCOUNTER — Other Ambulatory Visit: Payer: Self-pay

## 2022-04-26 ENCOUNTER — Emergency Department: Payer: Medicare Other

## 2022-04-26 DIAGNOSIS — F10231 Alcohol dependence with withdrawal delirium: Secondary | ICD-10-CM | POA: Diagnosis present

## 2022-04-26 DIAGNOSIS — J1282 Pneumonia due to coronavirus disease 2019: Secondary | ICD-10-CM | POA: Diagnosis present

## 2022-04-26 DIAGNOSIS — Z885 Allergy status to narcotic agent status: Secondary | ICD-10-CM

## 2022-04-26 DIAGNOSIS — F419 Anxiety disorder, unspecified: Secondary | ICD-10-CM | POA: Diagnosis present

## 2022-04-26 DIAGNOSIS — E871 Hypo-osmolality and hyponatremia: Secondary | ICD-10-CM | POA: Diagnosis present

## 2022-04-26 DIAGNOSIS — R9431 Abnormal electrocardiogram [ECG] [EKG]: Secondary | ICD-10-CM | POA: Diagnosis present

## 2022-04-26 DIAGNOSIS — U071 COVID-19: Principal | ICD-10-CM | POA: Diagnosis present

## 2022-04-26 DIAGNOSIS — I10 Essential (primary) hypertension: Secondary | ICD-10-CM | POA: Diagnosis present

## 2022-04-26 DIAGNOSIS — Z90711 Acquired absence of uterus with remaining cervical stump: Secondary | ICD-10-CM

## 2022-04-26 DIAGNOSIS — Z8619 Personal history of other infectious and parasitic diseases: Secondary | ICD-10-CM

## 2022-04-26 DIAGNOSIS — R4182 Altered mental status, unspecified: Secondary | ICD-10-CM | POA: Diagnosis not present

## 2022-04-26 DIAGNOSIS — J9601 Acute respiratory failure with hypoxia: Secondary | ICD-10-CM | POA: Diagnosis present

## 2022-04-26 DIAGNOSIS — R440 Auditory hallucinations: Secondary | ICD-10-CM | POA: Diagnosis present

## 2022-04-26 DIAGNOSIS — E861 Hypovolemia: Secondary | ICD-10-CM | POA: Diagnosis present

## 2022-04-26 DIAGNOSIS — F13231 Sedative, hypnotic or anxiolytic dependence with withdrawal delirium: Secondary | ICD-10-CM | POA: Diagnosis present

## 2022-04-26 DIAGNOSIS — F17210 Nicotine dependence, cigarettes, uncomplicated: Secondary | ICD-10-CM | POA: Diagnosis present

## 2022-04-26 DIAGNOSIS — Z881 Allergy status to other antibiotic agents status: Secondary | ICD-10-CM

## 2022-04-26 DIAGNOSIS — Z800 Family history of malignant neoplasm of digestive organs: Secondary | ICD-10-CM

## 2022-04-26 DIAGNOSIS — G9341 Metabolic encephalopathy: Secondary | ICD-10-CM | POA: Diagnosis present

## 2022-04-26 DIAGNOSIS — R441 Visual hallucinations: Secondary | ICD-10-CM | POA: Diagnosis present

## 2022-04-26 DIAGNOSIS — J439 Emphysema, unspecified: Secondary | ICD-10-CM | POA: Diagnosis present

## 2022-04-26 DIAGNOSIS — Z833 Family history of diabetes mellitus: Secondary | ICD-10-CM

## 2022-04-26 DIAGNOSIS — Z79899 Other long term (current) drug therapy: Secondary | ICD-10-CM

## 2022-04-26 DIAGNOSIS — E876 Hypokalemia: Secondary | ICD-10-CM | POA: Diagnosis present

## 2022-04-26 DIAGNOSIS — F39 Unspecified mood [affective] disorder: Secondary | ICD-10-CM | POA: Diagnosis present

## 2022-04-26 LAB — COMPREHENSIVE METABOLIC PANEL
ALT: 35 U/L (ref 0–44)
AST: 67 U/L — ABNORMAL HIGH (ref 15–41)
Albumin: 4.8 g/dL (ref 3.5–5.0)
Alkaline Phosphatase: 91 U/L (ref 38–126)
Anion gap: 10 (ref 5–15)
BUN: 16 mg/dL (ref 8–23)
CO2: 30 mmol/L (ref 22–32)
Calcium: 9.1 mg/dL (ref 8.9–10.3)
Chloride: 92 mmol/L — ABNORMAL LOW (ref 98–111)
Creatinine, Ser: 0.63 mg/dL (ref 0.44–1.00)
GFR, Estimated: >60 mL/min (ref >60)
Glucose, Bld: 130 mg/dL — ABNORMAL HIGH (ref 70–99)
Potassium: 3.2 mmol/L — ABNORMAL LOW (ref 3.5–5.1)
Sodium: 132 mmol/L — ABNORMAL LOW (ref 135–145)
Total Bilirubin: 0.9 mg/dL (ref 0.3–1.2)
Total Protein: 8.9 g/dL — ABNORMAL HIGH (ref 6.5–8.1)

## 2022-04-26 LAB — CBC
HCT: 38.5 % (ref 36.0–46.0)
Hemoglobin: 12.7 g/dL (ref 12.0–15.0)
MCH: 31.2 pg (ref 26.0–34.0)
MCHC: 33 g/dL (ref 30.0–36.0)
MCV: 94.6 fL (ref 80.0–100.0)
Platelets: 235 10*3/uL (ref 150–400)
RBC: 4.07 MIL/uL (ref 3.87–5.11)
RDW: 12.9 % (ref 11.5–15.5)
WBC: 7.4 10*3/uL (ref 4.0–10.5)
nRBC: 0 % (ref 0.0–0.2)

## 2022-04-26 LAB — ACETAMINOPHEN LEVEL: Acetaminophen (Tylenol), Serum: <10 ug/mL — ABNORMAL LOW (ref 10–30)

## 2022-04-26 LAB — SALICYLATE LEVEL: Salicylate Lvl: <7 mg/dL — ABNORMAL LOW (ref 7.0–30.0)

## 2022-04-26 LAB — ETHANOL: Alcohol, Ethyl (B): <10 mg/dL (ref <10)

## 2022-04-26 LAB — TROPONIN I (HIGH SENSITIVITY): Troponin I (High Sensitivity): 6 ng/L (ref <18)

## 2022-04-26 NOTE — ED Notes (Signed)
Pt dressed in hospital scrubs.  Pts belongings, include red purse, pink shoes, red tshirt, sunglasses, jean shorts, lighter, bagged, labelled and placed at nurses station.

## 2022-04-26 NOTE — ED Triage Notes (Signed)
Per ems pt with auditory and visual hallucinations. Per ems pt with ams, roommate called ems. Pt is alert to self and place only at this time. Pt states "I'm driving jerry crazy": per ems pt with vomiting and weakness, trouble walking. Pt states she did have chest pain earlier today.

## 2022-04-27 ENCOUNTER — Inpatient Hospital Stay: Payer: Medicare Other

## 2022-04-27 ENCOUNTER — Other Ambulatory Visit: Payer: Self-pay

## 2022-04-27 ENCOUNTER — Inpatient Hospital Stay
Admission: EM | Admit: 2022-04-27 | Discharge: 2022-05-03 | DRG: 208 | Disposition: A | Discharge status: Home / Self Care | Payer: Medicare Other | Attending: Internal Medicine | Admitting: Internal Medicine

## 2022-04-27 ENCOUNTER — Inpatient Hospital Stay: Payer: Self-pay

## 2022-04-27 ENCOUNTER — Emergency Department: Payer: Medicare Other

## 2022-04-27 DIAGNOSIS — E876 Hypokalemia: Secondary | ICD-10-CM | POA: Diagnosis present

## 2022-04-27 DIAGNOSIS — Z90711 Acquired absence of uterus with remaining cervical stump: Secondary | ICD-10-CM | POA: Diagnosis not present

## 2022-04-27 DIAGNOSIS — R441 Visual hallucinations: Secondary | ICD-10-CM | POA: Diagnosis present

## 2022-04-27 DIAGNOSIS — F13231 Sedative, hypnotic or anxiolytic dependence with withdrawal delirium: Secondary | ICD-10-CM | POA: Diagnosis present

## 2022-04-27 DIAGNOSIS — F13931 Sedative, hypnotic or anxiolytic use, unspecified with withdrawal delirium: Secondary | ICD-10-CM | POA: Diagnosis not present

## 2022-04-27 DIAGNOSIS — R9431 Abnormal electrocardiogram [ECG] [EKG]: Secondary | ICD-10-CM | POA: Diagnosis present

## 2022-04-27 DIAGNOSIS — G9341 Metabolic encephalopathy: Secondary | ICD-10-CM | POA: Diagnosis present

## 2022-04-27 DIAGNOSIS — F17210 Nicotine dependence, cigarettes, uncomplicated: Secondary | ICD-10-CM | POA: Diagnosis present

## 2022-04-27 DIAGNOSIS — Z79899 Other long term (current) drug therapy: Secondary | ICD-10-CM | POA: Diagnosis not present

## 2022-04-27 DIAGNOSIS — R410 Disorientation, unspecified: Secondary | ICD-10-CM | POA: Diagnosis not present

## 2022-04-27 DIAGNOSIS — J9601 Acute respiratory failure with hypoxia: Secondary | ICD-10-CM

## 2022-04-27 DIAGNOSIS — F1010 Alcohol abuse, uncomplicated: Secondary | ICD-10-CM | POA: Diagnosis present

## 2022-04-27 DIAGNOSIS — R4182 Altered mental status, unspecified: Secondary | ICD-10-CM | POA: Insufficient documentation

## 2022-04-27 DIAGNOSIS — Z833 Family history of diabetes mellitus: Secondary | ICD-10-CM | POA: Diagnosis not present

## 2022-04-27 DIAGNOSIS — I10 Essential (primary) hypertension: Secondary | ICD-10-CM | POA: Diagnosis present

## 2022-04-27 DIAGNOSIS — F419 Anxiety disorder, unspecified: Secondary | ICD-10-CM | POA: Diagnosis present

## 2022-04-27 DIAGNOSIS — U071 COVID-19: Secondary | ICD-10-CM | POA: Diagnosis present

## 2022-04-27 DIAGNOSIS — J439 Emphysema, unspecified: Secondary | ICD-10-CM | POA: Diagnosis present

## 2022-04-27 DIAGNOSIS — F10231 Alcohol dependence with withdrawal delirium: Secondary | ICD-10-CM | POA: Diagnosis present

## 2022-04-27 DIAGNOSIS — Z885 Allergy status to narcotic agent status: Secondary | ICD-10-CM | POA: Diagnosis not present

## 2022-04-27 DIAGNOSIS — Z8619 Personal history of other infectious and parasitic diseases: Secondary | ICD-10-CM | POA: Diagnosis not present

## 2022-04-27 DIAGNOSIS — Z881 Allergy status to other antibiotic agents status: Secondary | ICD-10-CM | POA: Diagnosis not present

## 2022-04-27 DIAGNOSIS — R440 Auditory hallucinations: Secondary | ICD-10-CM | POA: Diagnosis present

## 2022-04-27 DIAGNOSIS — Z800 Family history of malignant neoplasm of digestive organs: Secondary | ICD-10-CM | POA: Diagnosis not present

## 2022-04-27 DIAGNOSIS — J1282 Pneumonia due to coronavirus disease 2019: Secondary | ICD-10-CM | POA: Diagnosis present

## 2022-04-27 DIAGNOSIS — F39 Unspecified mood [affective] disorder: Secondary | ICD-10-CM | POA: Diagnosis present

## 2022-04-27 DIAGNOSIS — E861 Hypovolemia: Secondary | ICD-10-CM | POA: Diagnosis present

## 2022-04-27 DIAGNOSIS — E871 Hypo-osmolality and hyponatremia: Secondary | ICD-10-CM | POA: Diagnosis present

## 2022-04-27 LAB — BLOOD GAS, ARTERIAL
Acid-Base Excess: 6.4 mmol/L — ABNORMAL HIGH (ref 0.0–2.0)
Bicarbonate: 31.3 mmol/L — ABNORMAL HIGH (ref 20.0–28.0)
O2 Saturation: 94.4 %
Patient temperature: 37
pCO2 arterial: 45 mmHg (ref 32–48)
pH, Arterial: 7.45 (ref 7.35–7.45)
pO2, Arterial: 66 mmHg — ABNORMAL LOW (ref 83–108)

## 2022-04-27 LAB — URINALYSIS, ROUTINE W REFLEX MICROSCOPIC
Bilirubin Urine: NEGATIVE
Glucose, UA: NEGATIVE mg/dL
Hgb urine dipstick: NEGATIVE
Ketones, ur: 20 mg/dL — AB
Leukocytes,Ua: NEGATIVE
Nitrite: NEGATIVE
Protein, ur: NEGATIVE mg/dL
Specific Gravity, Urine: 1.013 (ref 1.005–1.030)
pH: 8 (ref 5.0–8.0)

## 2022-04-27 LAB — URINE DRUG SCREEN, QUALITATIVE (ARMC ONLY)
Amphetamines, Ur Screen: NOT DETECTED
Barbiturates, Ur Screen: NOT DETECTED
Benzodiazepine, Ur Scrn: POSITIVE — AB
Cannabinoid 50 Ng, Ur ~~LOC~~: NOT DETECTED
Cocaine Metabolite,Ur ~~LOC~~: NOT DETECTED
MDMA (Ecstasy)Ur Screen: NOT DETECTED
Methadone Scn, Ur: NOT DETECTED
Opiate, Ur Screen: NOT DETECTED
Phencyclidine (PCP) Ur S: NOT DETECTED
Tricyclic, Ur Screen: NOT DETECTED

## 2022-04-27 LAB — TSH: TSH: 1.067 u[IU]/mL (ref 0.350–4.500)

## 2022-04-27 LAB — POTASSIUM: Potassium: 3.4 mmol/L — ABNORMAL LOW (ref 3.5–5.1)

## 2022-04-27 LAB — TROPONIN I (HIGH SENSITIVITY): Troponin I (High Sensitivity): 5 ng/L (ref <18)

## 2022-04-27 LAB — PHOSPHORUS: Phosphorus: 2.8 mg/dL (ref 2.5–4.6)

## 2022-04-27 LAB — GLUCOSE, CAPILLARY
Glucose-Capillary: 137 mg/dL — ABNORMAL HIGH (ref 70–99)
Glucose-Capillary: 102 mg/dL — ABNORMAL HIGH (ref 70–99)

## 2022-04-27 LAB — PROCALCITONIN: Procalcitonin: <0.1 ng/mL

## 2022-04-27 LAB — AMMONIA: Ammonia: 18 umol/L (ref 9–35)

## 2022-04-27 LAB — T4, FREE: Free T4: 0.81 ng/dL (ref 0.61–1.12)

## 2022-04-27 LAB — MRSA NEXT GEN BY PCR, NASAL: MRSA by PCR Next Gen: NOT DETECTED

## 2022-04-27 LAB — HIV ANTIBODY (ROUTINE TESTING W REFLEX): HIV Screen 4th Generation wRfx: NONREACTIVE

## 2022-04-27 LAB — CBG MONITORING, ED: Glucose-Capillary: 127 mg/dL — ABNORMAL HIGH (ref 70–99)

## 2022-04-27 LAB — MAGNESIUM: Magnesium: 2.4 mg/dL (ref 1.7–2.4)

## 2022-04-27 MED ORDER — NOREPINEPHRINE 4 MG/250ML-% IV SOLN
0.0000 ug/min | INTRAVENOUS | Status: DC
  Administered 2022-04-28: 2 ug/min via INTRAVENOUS
  Filled 2022-04-27: qty 250

## 2022-04-27 MED ORDER — SODIUM CHLORIDE 0.9 % IV SOLN: INTRAVENOUS | Status: DC

## 2022-04-27 MED ORDER — SODIUM CHLORIDE 0.9 % IV SOLN
3.0000 g | Freq: Four times a day (QID) | INTRAVENOUS | Status: DC
  Administered 2022-04-27 – 2022-05-02 (×18): 3 g via INTRAVENOUS
  Filled 2022-04-27 (×8): qty 3
  Filled 2022-04-27: qty 8
  Filled 2022-04-27: qty 3
  Filled 2022-04-27: qty 8
  Filled 2022-04-27 (×3): qty 3
  Filled 2022-04-27: qty 8
  Filled 2022-04-27 (×4): qty 3

## 2022-04-27 MED ORDER — LORAZEPAM 2 MG/ML IJ SOLN
2.0000 mg | Freq: Once | INTRAMUSCULAR | Status: AC
  Administered 2022-04-27: 2 mg via INTRAVENOUS
  Filled 2022-04-27: qty 1

## 2022-04-27 MED ORDER — DEXMEDETOMIDINE HCL IN NACL 200 MCG/50ML IV SOLN
0.4000 ug/kg/h | INTRAVENOUS | Status: DC
  Filled 2022-04-27: qty 50

## 2022-04-27 MED ORDER — M.V.I. ADULT IV INJ
INTRAVENOUS | Status: DC
  Filled 2022-04-27: qty 1000

## 2022-04-27 MED ORDER — ETOMIDATE 2 MG/ML IV SOLN
20.0000 mg | Freq: Once | INTRAVENOUS | Status: DC
Start: 2022-04-27 — End: 2022-04-29

## 2022-04-27 MED ORDER — MIDAZOLAM HCL 2 MG/2ML IJ SOLN
INTRAMUSCULAR | Status: AC
  Administered 2022-04-27: 2 mg
  Filled 2022-04-27: qty 2

## 2022-04-27 MED ORDER — FENTANYL 2500MCG IN NS 250ML (10MCG/ML) PREMIX INFUSION
25.0000 ug/h | INTRAVENOUS | Status: DC
  Administered 2022-04-27: 50 ug/h via INTRAVENOUS
  Administered 2022-04-29: 75 ug/h via INTRAVENOUS
  Filled 2022-04-27 (×2): qty 250

## 2022-04-27 MED ORDER — LACTATED RINGERS IV BOLUS
1000.0000 mL | Freq: Once | INTRAVENOUS | Status: AC
  Administered 2022-04-27: 1000 mL via INTRAVENOUS

## 2022-04-27 MED ORDER — ONDANSETRON HCL 4 MG PO TABS: 4.0000 mg | ORAL_TABLET | Freq: Four times a day (QID) | ORAL | Status: DC | PRN

## 2022-04-27 MED ORDER — LORAZEPAM 1 MG PO TABS: 1.0000 mg | ORAL_TABLET | ORAL | Status: DC | PRN

## 2022-04-27 MED ORDER — ACETAMINOPHEN 650 MG RE SUPP: 650.0000 mg | Freq: Four times a day (QID) | RECTAL | Status: DC | PRN

## 2022-04-27 MED ORDER — ONDANSETRON HCL 4 MG/2ML IJ SOLN: 4.0000 mg | Freq: Four times a day (QID) | INTRAMUSCULAR | Status: DC | PRN

## 2022-04-27 MED ORDER — VECURONIUM BROMIDE 10 MG IV SOLR
INTRAVENOUS | Status: AC
  Administered 2022-04-27: 10 mg
  Filled 2022-04-27: qty 10

## 2022-04-27 MED ORDER — POTASSIUM CHLORIDE 10 MEQ/100ML IV SOLN
10.0000 meq | Freq: Once | INTRAVENOUS | Status: AC
  Administered 2022-04-27: 10 meq via INTRAVENOUS
  Filled 2022-04-27: qty 100

## 2022-04-27 MED ORDER — ORAL CARE MOUTH RINSE: 15.0000 mL | OROMUCOSAL | Status: DC | PRN

## 2022-04-27 MED ORDER — FENTANYL CITRATE PF 50 MCG/ML IJ SOSY: 25.0000 ug | PREFILLED_SYRINGE | INTRAMUSCULAR | Status: DC | PRN

## 2022-04-27 MED ORDER — LORAZEPAM 2 MG PO TABS: 0.0000 mg | ORAL_TABLET | Freq: Four times a day (QID) | ORAL | Status: DC

## 2022-04-27 MED ORDER — PHENOBARBITAL SODIUM 130 MG/ML IJ SOLN
130.0000 mg | INTRAMUSCULAR | Status: DC
  Filled 2022-04-27 (×2): qty 1

## 2022-04-27 MED ORDER — LORAZEPAM 2 MG/ML IJ SOLN
1.0000 mg | Freq: Three times a day (TID) | INTRAMUSCULAR | Status: DC
Start: 2022-04-27 — End: 2022-04-29
  Administered 2022-04-27 – 2022-04-29 (×6): 1 mg via INTRAVENOUS
  Filled 2022-04-27 (×6): qty 1

## 2022-04-27 MED ORDER — FENTANYL CITRATE PF 50 MCG/ML IJ SOSY
PREFILLED_SYRINGE | INTRAMUSCULAR | Status: AC
  Administered 2022-04-27: 100 ug
  Filled 2022-04-27: qty 2

## 2022-04-27 MED ORDER — LORAZEPAM 2 MG PO TABS: 0.0000 mg | ORAL_TABLET | Freq: Two times a day (BID) | ORAL | Status: DC

## 2022-04-27 MED ORDER — PROPOFOL 1000 MG/100ML IV EMUL
5.0000 ug/kg/min | INTRAVENOUS | Status: DC
  Administered 2022-04-27: 20 ug/kg/min via INTRAVENOUS
  Administered 2022-04-28: 60 ug/kg/min via INTRAVENOUS
  Administered 2022-04-28 (×2): 50 ug/kg/min via INTRAVENOUS
  Administered 2022-04-28: 45 ug/kg/min via INTRAVENOUS
  Administered 2022-04-28: 50 ug/kg/min via INTRAVENOUS
  Administered 2022-04-29: 30 ug/kg/min via INTRAVENOUS
  Administered 2022-04-29 – 2022-04-30 (×4): 50 ug/kg/min via INTRAVENOUS
  Filled 2022-04-27 (×11): qty 100

## 2022-04-27 MED ORDER — FAMOTIDINE IN NACL 20-0.9 MG/50ML-% IV SOLN: 20.0000 mg | INTRAVENOUS | Status: DC

## 2022-04-27 MED ORDER — FOLIC ACID 1 MG PO TABS: 1.0000 mg | ORAL_TABLET | Freq: Every day | ORAL | Status: DC

## 2022-04-27 MED ORDER — PANTOPRAZOLE SODIUM 40 MG IV SOLR
40.0000 mg | INTRAVENOUS | Status: DC
  Administered 2022-04-27: 40 mg via INTRAVENOUS
  Filled 2022-04-27: qty 10

## 2022-04-27 MED ORDER — MIDAZOLAM HCL 2 MG/2ML IJ SOLN: 1.0000 mg | INTRAMUSCULAR | Status: DC | PRN

## 2022-04-27 MED ORDER — THIAMINE HCL 100 MG/ML IJ SOLN: 100.0000 mg | Freq: Every day | INTRAMUSCULAR | Status: DC

## 2022-04-27 MED ORDER — HALOPERIDOL LACTATE 5 MG/ML IJ SOLN
INTRAMUSCULAR | Status: AC
  Filled 2022-04-27: qty 1

## 2022-04-27 MED ORDER — POLYETHYLENE GLYCOL 3350 17 G PO PACK
17.0000 g | PACK | Freq: Every day | ORAL | Status: DC
  Administered 2022-04-28 – 2022-04-30 (×3): 17 g
  Filled 2022-04-27 (×3): qty 1

## 2022-04-27 MED ORDER — LORAZEPAM 2 MG/ML IJ SOLN
1.0000 mg | INTRAMUSCULAR | Status: DC | PRN
  Administered 2022-04-27 (×2): 2 mg via INTRAVENOUS
  Filled 2022-04-27 (×2): qty 1

## 2022-04-27 MED ORDER — DEXMEDETOMIDINE HCL IN NACL 400 MCG/100ML IV SOLN
0.4000 ug/kg/h | INTRAVENOUS | Status: DC
  Administered 2022-04-27: 0.4 ug/kg/h via INTRAVENOUS
  Administered 2022-04-29: 0.6 ug/kg/h via INTRAVENOUS
  Filled 2022-04-27 (×3): qty 100

## 2022-04-27 MED ORDER — ADULT MULTIVITAMIN W/MINERALS CH: 1.0000 | ORAL_TABLET | Freq: Every day | ORAL | Status: DC

## 2022-04-27 MED ORDER — ACETAMINOPHEN 325 MG PO TABS
650.0000 mg | ORAL_TABLET | Freq: Four times a day (QID) | ORAL | Status: DC | PRN
  Administered 2022-05-01 – 2022-05-03 (×5): 650 mg via ORAL
  Filled 2022-04-27 (×5): qty 2

## 2022-04-27 MED ORDER — ENOXAPARIN SODIUM 40 MG/0.4ML IJ SOSY
40.0000 mg | PREFILLED_SYRINGE | INTRAMUSCULAR | Status: DC
  Administered 2022-04-27: 40 mg via SUBCUTANEOUS
  Filled 2022-04-27: qty 0.4

## 2022-04-27 MED ORDER — MIDAZOLAM HCL 2 MG/2ML IJ SOLN
1.0000 mg | INTRAMUSCULAR | Status: DC | PRN
  Administered 2022-04-27: 1 mg via INTRAVENOUS
  Filled 2022-04-27: qty 2

## 2022-04-27 MED ORDER — FENTANYL BOLUS VIA INFUSION: 25.0000 ug | INTRAVENOUS | Status: DC | PRN

## 2022-04-27 MED ORDER — ETOMIDATE 2 MG/ML IV SOLN
INTRAVENOUS | Status: AC
  Filled 2022-04-27: qty 20

## 2022-04-27 MED ORDER — LORAZEPAM 2 MG/ML IJ SOLN: 1.0000 mg | INTRAMUSCULAR | Status: DC

## 2022-04-27 MED ORDER — SODIUM CHLORIDE 0.9 % IV SOLN
250.0000 mL | INTRAVENOUS | Status: DC
  Administered 2022-04-27 – 2022-04-29 (×2): 250 mL via INTRAVENOUS

## 2022-04-27 MED ORDER — SODIUM CHLORIDE 0.9 % IV SOLN
200.0000 mg | Freq: Once | INTRAVENOUS | Status: AC
  Administered 2022-04-27: 200 mg via INTRAVENOUS
  Filled 2022-04-27: qty 1.54

## 2022-04-27 MED ORDER — CHLORHEXIDINE GLUCONATE CLOTH 2 % EX PADS
6.0000 | MEDICATED_PAD | Freq: Every day | CUTANEOUS | Status: DC
  Administered 2022-04-28 – 2022-05-03 (×6): 6 via TOPICAL

## 2022-04-27 MED ORDER — POTASSIUM CHLORIDE 2 MEQ/ML IV SOLN
INTRAVENOUS | Status: DC
  Filled 2022-04-27 (×13): qty 1000

## 2022-04-27 MED ORDER — PANTOPRAZOLE 2 MG/ML SUSPENSION
40.0000 mg | Freq: Every day | ORAL | Status: DC
  Administered 2022-04-28 – 2022-04-30 (×3): 40 mg
  Filled 2022-04-27 (×3): qty 20

## 2022-04-27 MED ORDER — THIAMINE HCL 100 MG/ML IJ SOLN
100.0000 mg | Freq: Once | INTRAMUSCULAR | Status: AC
  Administered 2022-04-27: 100 mg via INTRAVENOUS
  Filled 2022-04-27: qty 2

## 2022-04-27 MED ORDER — HALOPERIDOL LACTATE 5 MG/ML IJ SOLN
1.0000 mg | Freq: Once | INTRAMUSCULAR | Status: AC
  Administered 2022-04-27: 1 mg via INTRAVENOUS

## 2022-04-27 MED ORDER — DOCUSATE SODIUM 50 MG/5ML PO LIQD
100.0000 mg | Freq: Two times a day (BID) | ORAL | Status: DC
  Administered 2022-04-28 – 2022-04-30 (×5): 100 mg
  Filled 2022-04-27 (×5): qty 10

## 2022-04-27 MED ORDER — FENTANYL CITRATE (PF) 100 MCG/2ML IJ SOLN
INTRAMUSCULAR | Status: AC
  Administered 2022-04-27: 100 ug
  Filled 2022-04-27: qty 2

## 2022-04-27 MED ORDER — FENTANYL CITRATE (PF) 100 MCG/2ML IJ SOLN: 100.0000 ug | Freq: Once | INTRAMUSCULAR | Status: DC

## 2022-04-27 MED ORDER — HYDRALAZINE HCL 20 MG/ML IJ SOLN
10.0000 mg | Freq: Four times a day (QID) | INTRAMUSCULAR | Status: DC | PRN
  Administered 2022-05-02: 10 mg via INTRAVENOUS
  Filled 2022-04-27: qty 1

## 2022-04-27 MED ORDER — ORAL CARE MOUTH RINSE
15.0000 mL | OROMUCOSAL | Status: DC
  Administered 2022-04-28 – 2022-04-30 (×32): 15 mL via OROMUCOSAL

## 2022-04-27 MED ORDER — THIAMINE HCL 100 MG/ML IJ SOLN
500.0000 mg | Freq: Three times a day (TID) | INTRAMUSCULAR | Status: AC
  Administered 2022-04-27 – 2022-04-28 (×6): 500 mg via INTRAVENOUS
  Filled 2022-04-27 (×7): qty 5

## 2022-04-27 MED ORDER — POTASSIUM CHLORIDE 20 MEQ PO PACK
20.0000 meq | PACK | Freq: Once | ORAL | Status: AC
  Administered 2022-04-28: 20 meq
  Filled 2022-04-27: qty 1

## 2022-04-27 MED ORDER — VALPROATE SODIUM 100 MG/ML IV SOLN
250.0000 mg | Freq: Three times a day (TID) | INTRAVENOUS | Status: DC
  Administered 2022-04-27 – 2022-04-29 (×7): 250 mg via INTRAVENOUS
  Filled 2022-04-27 (×8): qty 2.5

## 2022-04-27 MED ORDER — FENTANYL CITRATE PF 50 MCG/ML IJ SOSY
25.0000 ug | PREFILLED_SYRINGE | Freq: Once | INTRAMUSCULAR | Status: AC
  Administered 2022-04-28: 25 ug via INTRAVENOUS

## 2022-04-27 MED ORDER — THIAMINE MONONITRATE 100 MG PO TABS: 100.0000 mg | ORAL_TABLET | Freq: Every day | ORAL | Status: DC

## 2022-04-27 MED ORDER — MIDAZOLAM HCL 2 MG/2ML IJ SOLN
4.0000 mg | Freq: Once | INTRAMUSCULAR | Status: DC
Start: 2022-04-27 — End: 2022-04-29

## 2022-04-27 MED ORDER — PROPOFOL 1000 MG/100ML IV EMUL
INTRAVENOUS | Status: AC
  Administered 2022-04-27: 20 mg
  Filled 2022-04-27: qty 100

## 2022-04-27 NOTE — Consult Note (Signed)
Client is on Precedex and intubated, unable to assess.  Psych will continue to follow.  Darvell Monteforte, PMHNP 

## 2022-04-27 NOTE — Assessment & Plan Note (Signed)
Treatment as outlined in 1 

## 2022-04-27 NOTE — Significant Event (Signed)
Rapid Response Event Note   Reason for Call : called for pt in active withdrawls in room 125.   Initial Focused Assessment: Hitting, kicking, halluncinating.      Interventions: Dr Joylene Igo at bedside, ordered haldol, and phenobarbital , 1:1 sitter, and transfer to stepdown for closer monitoring.   Plan of Care: as above    Event Summary: as above  MD Notified: Agbata 0947 Call FVOH:[id number] Arrival Time:0950 End Time:1015  Twila Rappa A, RN

## 2022-04-27 NOTE — Assessment & Plan Note (Signed)
Supplement potassium Magnesium levels are within normal limits

## 2022-04-27 NOTE — Progress Notes (Addendum)
Acute Hypoxic Respiratory Failure secondary to suspected aspiration in the setting of altered mental status MRI wo contrast 04/27/22: no acute intracranial abnormality. Mild chronic microvascular ischemic disease. Upon bedside assessment after return from MRI, patient hypoxic in the high 80's on HFNC with gurgling respirations and a RASS: -4. NTS performed without significant improvement and the decision was made to emergently intubate. - Ventilator settings: PRVC  8 mL/kg, 45% FiO2, 5 PEEP, continue ventilator support & lung protective strategies - Wean PEEP & FiO2 as tolerated, maintain SpO2 > 90% - Head of bed elevated 30 degrees, VAP protocol in place - Plateau pressures less than 30 cm H20  - Intermittent chest x-ray & ABG PRN - Daily WUA with SBT as tolerated  - Ensure adequate pulmonary hygiene  - F/u cultures, trend PCT - Continue Aspiration Pna coverage: unasyn - Bronchodilators PRN - PAD protocol in place: continue Fentanyl drip & Propofol drip   Updated daughter to current clinical status and need for emergent intubation. All questions and concerns answered at this time.  Addendum: Patient COVID-19 positive COVID-19 Infection in the setting of suspected bacterial pneumonia - start methylprednisolone 0.5 mg/kg x 3 days, followed by a taper - Follow inflammatory markers: Ferritin, D-dimer, CRP, LDH - continue Vitamin C & zinc - as above f/u cultures, monitor WBC/ fever curve, trend PCT   Betsey Holiday, AGACNP-BC Acute Care Nurse Practitioner [facility]   [phone] / [phone] Please see Amion for pager details.

## 2022-04-27 NOTE — Progress Notes (Signed)
Rapid response called when patient arrived on the floor because she was agitated despite having received 4 mg of Ativan and 1 mg of Haldol. Arrived to see patient extremely agitated and flailing her extremities Discussed with intensivist the need for IV Precedex for this patient's delirium concerning for possible alcohol withdrawal She recommended to try patient on phenobarbital taper. Order placed for patient to be transferred to stepdown for IV Precedex

## 2022-04-27 NOTE — H&P (Signed)
History and Physical    Patient: Stephanie Skinner GLO:[id number] DOB: [birth year] DOA: 04/27/2022 DOS: the patient was seen and examined on 04/27/2022 PCP: Patient, No Pcp Per  Patient coming from: Home  Chief Complaint:  Chief Complaint  Patient presents with   Altered Mental Status   Patient is unable to provide any history.  Most of the history was obtained from her daughter over the phone as well as her roommate HPI: Stephanie Skinner is a 66 y.o. female with medical history significant for " psychiatric issues possible bipolar disorder", history of polysubstance abuse, hypertension and seizures who was brought into the ER by EMS after her roommate called for evaluation of mental status changes and hallucinations. According to patient's daughter she has noticed that her mother has become increasingly forgetful over the last 6 months which was concerning to her.  She says she has called social services several times without any assistance and also notes that her mother has had frequent falls with fractures requiring surgical repair in the last 1 year.  Her roommate notes that she has cognitive impairments which he describes as being forgetful but this has worsened over the last several days according to him and he called EMS when he noted the hallucinations which are both auditory and visual.  Her roommate states that she drinks a few beers daily and is unable to tell me if she uses any illicit drugs.  He notes that she is on a number of medications for her mood disorder. I am unable to do a review of systems on this patient due to her mental status.  She has visual hallucinations and is seeing things on the ceiling in the room.  She is oriented to name call but is unable to follow any commands.   Review of Systems: Unable to do review of systems due to patient's mental status Past Medical History:  Diagnosis Date   Arthritis    Hepatitis C    pt states she has been cured   Hypertension     Seizures (HCC)    Past Surgical History:  Procedure Laterality Date   ABDOMINAL HYSTERECTOMY     "partial hysterectomy"   ELBOW SURGERY     Social History:  reports that she has been smoking cigarettes. She has been smoking an average of 1 pack per day. She has never used smokeless tobacco. She reports current alcohol use. She reports that she does not use drugs.  Allergies  Allergen Reactions   Codeine Nausea And Vomiting   Morphine And Related Other (See Comments)    seizures   Sulfa Antibiotics Nausea And Vomiting    Family History  Problem Relation Age of Onset   Diabetes Sister    Colon cancer Sister     Prior to Admission medications   Medication Sig Start Date End Date Taking? Authorizing Provider  cetirizine (ZYRTEC) 10 MG tablet Take 10 mg by mouth daily. 07/09/17   [provider]  promethazine (PHENERGAN) 25 MG tablet Take 1 tablet (25 mg total) by mouth every 6 (six) hours as needed for nausea or vomiting. 08/11/17   Houston Siren, MD  propranolol ER (INDERAL LA) 60 MG 24 hr capsule Take 1 capsule (60 mg total) by mouth daily. 07/20/17   Altamese Dilling, MD    Physical Exam: Vitals:   04/27/22 0630 04/27/22 0700 04/27/22 0712 04/27/22 0828  BP: (!) 166/77  (!) 161/87 138/84  Pulse: 85 91  82  Resp: 20 17  18  Temp:   98.2 F (36.8 C) 98.2 F (36.8 C)  TempSrc:   Oral   SpO2: 96% 100%  96%  Weight:      Height:       Physical Exam Vitals and nursing note reviewed.  Constitutional:      Comments: Oriented to name call.  Unable to follow commands  HENT:     Head: Normocephalic and atraumatic.     Nose: Nose normal.     Mouth/Throat:     Mouth: Mucous membranes are dry.  Eyes:     Comments: Pale conjunctiva  Cardiovascular:     Rate and Rhythm: Normal rate and regular rhythm.  Pulmonary:     Effort: Pulmonary effort is normal.     Breath sounds: Normal breath sounds.  Abdominal:     General: Abdomen is flat. Bowel sounds are  normal.     Palpations: Abdomen is soft.  Musculoskeletal:        General: Normal range of motion.     Cervical back: Normal range of motion and neck supple.  Skin:    General: Skin is warm and dry.  Neurological:     Comments: Able to move her extremities.  Unable to follow any commands  Psychiatric:     Comments: Unable to assess     Data Reviewed: Relevant notes from primary care and specialist visits, past discharge summaries as available in EHR, including Care Everywhere. Prior diagnostic testing as pertinent to current admission diagnoses Updated medications and problem lists for reconciliation ED course, including vitals, labs, imaging, treatment and response to treatment Triage notes, nursing and pharmacy notes and ED provider's notes Notable results as noted in HPI Labs reviewed.  TSH 1.067, magnesium 2.4, urine drug screen is positive for benzodiazepines, ammonia 18, white count 7.4, hemoglobin 12.7, hematocrit 30.5, platelet count 235, sodium 132, potassium 3.2, chloride 92, bicarb 30, glucose 130, BUN 16, creatinine 0.63, calcium 9.1, total protein 8.9, albumin 4.8, AST 67, ALT 35, alkaline phosphatase 91, total bilirubin 0.9 CT scan of the head without contrast shows no acute intracranial abnormality Chest x-ray reviewed by me shows no acute cardiopulmonary disease Twelve-lead EKG reviewed by me shows normal sinus rhythm There are no new results to review at this time.  Assessment and Plan: * Delirium Patient was brought into the ER by EMS for evaluation of visual and auditory hallucinations. She has a history per daughter of substance abuse and roommate states that she is a daily alcohol user but is unsure of when her last drink was. Concern for possible Warnicke's encephalopathy as patient is oriented only to person and unable to follow any commands vs benzodiazepine withdrawal We will treat patient with high-dose thiamine IV fluid hydration Place patient on CIWA  protocol and administer lorazepam for CIWA score of 8 or greater We will consult psychiatry Fall precautions  Alcohol abuse Treatment as outlined in 1  Essential hypertension Blood pressure is uncontrolled We will place patient on IV hydralazine for systolic blood pressure greater than  Hypokalemia Supplement potassium Magnesium levels are within normal limits      Advance Care Planning:   Code Status: Full Code   Consults: Behavioral health  Family Communication: Greater than 50% of time was spent discussing patient's condition and plan of care with her daughter Vonzella Nipple over the phone.  All questions and concerns have been addressed.  She verbalizes understanding and agrees with the plan.  Severity of Illness: The appropriate patient  status for this patient is INPATIENT. Inpatient status is judged to be reasonable and necessary in order to provide the required intensity of service to ensure the patient's safety. The patient's presenting symptoms, physical exam findings, and initial radiographic and laboratory data in the context of their chronic comorbidities is felt to place them at high risk for further clinical deterioration. Furthermore, it is not anticipated that the patient will be medically stable for discharge from the hospital within 2 midnights of admission.   * I certify that at the point of admission it is my clinical judgment that the patient will require inpatient hospital care spanning beyond 2 midnights from the point of admission due to high intensity of service, high risk for further deterioration and high frequency of surveillance required.*  Author: Lucile Shutters, MD 04/27/2022 9:12 AM  For on call review [email or web address].

## 2022-04-27 NOTE — Progress Notes (Signed)
Safety sitter medically necessary for pt due to CIWA scores and pt not following commands, physically combative at the moment

## 2022-04-27 NOTE — ED Notes (Signed)
Report to amber, rn.  

## 2022-04-27 NOTE — ED Notes (Signed)
VOL  

## 2022-04-27 NOTE — Progress Notes (Signed)
Daughter updated 

## 2022-04-27 NOTE — Assessment & Plan Note (Signed)
Blood pressure is uncontrolled We will place patient on IV hydralazine for systolic blood pressure greater than 

## 2022-04-27 NOTE — Procedures (Signed)
Intubation Procedure Note  WRETHA LARIS  [id number]  September 26, 1955  Date:04/27/22  Time:10:12 PM   Provider Performing:Kasheem Toner L [place]    Procedure: Intubation (31500)  Indication(s) Respiratory Failure  Consent Unable to obtain consent due to emergent nature of procedure. Spoke with daughter Beatrix Shipper via telephone to alert her of need for emergent intubation.    Anesthesia Etomidate, Versed, and Fentanyl   Time Out Verified patient identification, verified procedure, site/side was marked, verified correct patient position, special equipment/implants available, medications/allergies/relevant history reviewed, required imaging and test results available.   Sterile Technique Usual hand hygeine, masks, and gloves were used   Procedure Description Patient positioned in bed supine.  Sedation given as noted above.  Patient was intubated with endotracheal tube using  MAC #4 .  View was Grade 3 only epiglottis .  Number of attempts was 1.  Colorimetric CO2 detector was consistent with tracheal placement.   Complications/Tolerance None; patient tolerated the procedure well. Chest X-ray is ordered to verify placement.   EBL Minimal   Specimen(s) None  Venetia Night, AGACNP-BC Acute Care Nurse Practitioner [facility]   [phone] / [phone] Please see Amion for pager details.

## 2022-04-27 NOTE — Assessment & Plan Note (Addendum)
Patient was brought into the ER by EMS for evaluation of visual and auditory hallucinations. She has a history per daughter of substance abuse and roommate states that she is a daily alcohol user but is unsure of when her last drink was. Concern for possible Warnicke's encephalopathy as patient is oriented only to person and unable to follow any commands vs benzodiazepine withdrawal We will treat patient with high-dose thiamine IV fluid hydration Place patient on CIWA protocol and administer lorazepam for CIWA score of 8 or greater We will consult psychiatry Fall precautions

## 2022-04-27 NOTE — Consult Note (Signed)
NAME:  Stephanie Skinner, MRN:  [id number], DOB:  12-May-1956, LOS: 0 ADMISSION DATE:  04/27/2022, CONSULTATION DATE: 04/27/22 REFERRING MD: Dr. Joylene Igo, CHIEF COMPLAINT: Delirium    History of Present Illness:  This is a 66 yo female with a hx of ETOH Abuse who presented to [facility] ER on 09/1 via EMS with auditory and visual hallucinations, onset of symptoms several days prior to presentation.  Per ER notes EMS reported pts roommate called EMS, and upon their arrival pt alert to self and place only.  Pt also had vomiting episode, weakness, and difficulty with abulation.  Pt also endorsed chest pain during the earlier part of the day.  Pts roommate reports pt recently ran out of her xanax and drinks 2 beers daily.  Pts daughter reports the pt does not have any diagnosed psychiatric illnesses, however she is concerned the pt has undiagnosed Bipolar Disorder.    ED Course  In the ER CT Head negative.  Initial EKG revealed NSR, heart rate 74 with prolonged Qtc 483.  Lab results: Na+ 132, K+ 3.2, chloride 92, glucose 130, AST 67, tylenol level <10, salicylate level <7.0, and UA negative for UTI.  CXR negative for acute cardiopulmonary disease, but revealed emphysema.  While in the ER pts mentation continued to decline repeat CT Head on 09/2 negative and EKG revealed normal sinus rhythm with worsening prolonged Qtc 528.  Pt received 100 mg of iv thiamine due to concern of possible wernike's encephalopathy.  Pt subsequently admitted to the medsurg unit per hospitalist team for additional workup and treatment.  Hospital Course  On 09/2 pt developed worsening agitation/delirium requiring transfer to the stepdown unit for precedex gtt and closer monitoring.  PCCM team consulted to assist with management.   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  PCCM team consulted to assist with management   Interim History / Subjective:  Pt asleep with no signs of agitation following administration of 200 mg iv phenobarbital and 1 mg of iv haldol   Objective   Blood pressure (!) 120/47, pulse 77, temperature (!) 97.5 F (36.4 C), temperature source Axillary, resp. rate 16, height 5\' 6"  (1.676 m), weight 63.5 kg, SpO2 (!) 88 %.        Intake/Output Summary (Last 24 hours) at 04/27/2022 1332 Last data filed at 04/27/2022 1057 Gross per 24 hour  Intake 611.54 ml  Output --  Net 611.54 ml   Filed Weights   04/26/22 2103  Weight: 63.5 kg    Examination: General: Acute on chronically-ill appearing female, in mild respiratory distress  HENT: Supple, no JVD  Lungs: Rhonchi throughout with gurgling respirations, non labored  Cardiovascular: Sinus tachycardia, s1s2, no r/g, 2+ radial/1+ distal pulses, trace bilateral lower extremity edema  Abdomen: +BS x4, soft, non distended  Extremities: Moves all extremities, scattered bilateral lower extremity abrasions with ecchymosis  Neuro: Sedated, severe tremors present, not following commands withdraws from stimulation, PERRL GU: Voiding   Resolved Hospital Problem list     Assessment & Plan:  Acute encephalopathy secondary to ETOH/Benzodiazepine Withdrawal complicated by 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 following  possible seizure activity  Elevated AST  QTc prolongation  - Continue phenobarbital taper and start standing dose of 1 mg iv ativan q8hrs; will avoid haldol given Qtc prolongation  - If agitation/delirium worsens will start precedex gtt  - Continue high dose thiamine for 3 days followed by 100 mg  daily  - Continue MVI infusion  - CIWA protocol  - Continuous telemetry monitoring  - Once mentation improves will need psychiatry consult and polysubstance abuse cessation counseling  - Trend hepatic function  - MRI Brain and EEG pending  - Will start depacon for possible seizure activity  - Pt HIGH RISK for mechanical intubation for airway protection   HTN - Prn hydralazine for bp management   Hyponatremia likely in the setting of hypovolemia  Hypokalemia  - Trend BMP  - Replace electrolytes as indicated  - Monitor UOP  - IV fluid resuscitation   Best Practice (right click and "Reselect all SmartList Selections" daily)   Diet/type: NPO DVT prophylaxis: LMWH GI prophylaxis: PPI Lines: N/A Foley:  N/A Code Status:  full code Last date of multidisciplinary goals of care discussion [N/A]  Labs   CBC: Recent Labs  Lab 04/26/22 2113  WBC 7.4  HGB 12.7  HCT 38.5  MCV 94.6  PLT 235    Basic Metabolic Panel: Recent Labs  Lab 04/26/22 2113 04/27/22 0525  NA 132*  --   K 3.2*  --   CL 92*  --   CO2 30  --   GLUCOSE 130*  --   BUN 16  --   CREATININE 0.63  --   CALCIUM 9.1  --   MG  --  2.4   GFR: Estimated Creatinine Clearance: 63.9 mL/min (by C-G formula based on SCr of 0.63 mg/dL). Recent Labs  Lab 04/26/22 2113  WBC 7.4    Liver Function Tests: Recent Labs  Lab 04/26/22 2113  AST 67*  ALT 35  ALKPHOS 91  BILITOT 0.9  PROT 8.9*  ALBUMIN 4.8   No results for input(s): "LIPASE", "AMYLASE" in the last 168 hours. Recent Labs  Lab 04/27/22 0525  AMMONIA 18    ABG    Component Value Date/Time   PHART 7.45 04/27/2022 0525   PCO2ART 45 04/27/2022 0525   PO2ART 66 (L) 04/27/2022 0525   HCO3 31.3 (H) 04/27/2022 0525   O2SAT 94.4 04/27/2022 0525     Coagulation Profile: No results for input(s): "INR", "PROTIME" in the last 168 hours.  Cardiac Enzymes: No results for input(s): "CKTOTAL", "CKMB", "CKMBINDEX", "TROPONINI" in the last 168  hours.  HbA1C: No results found for: "HGBA1C"  CBG: Recent Labs  Lab 04/27/22 0520 04/27/22 1216  GLUCAP 127* 137*    Review of Systems:   Unable to assess pt sedated and unable to participate   Past Medical History:  She,  has a past medical history of Arthritis, Hepatitis C, Hypertension, and Seizures (HCC).   Surgical History:   Past Surgical History:  Procedure Laterality Date   ABDOMINAL HYSTERECTOMY     "partial hysterectomy"   ELBOW SURGERY       Social History:   reports that she has been smoking cigarettes. She has been smoking an average of 1 pack per day. She has never used smokeless tobacco. She reports current alcohol use. She reports that she does not use drugs.   Family History:  Her family history includes Colon cancer in her sister; Diabetes in her sister.   Allergies Allergies  Allergen Reactions  Codeine Nausea And Vomiting   Morphine And Related Other (See Comments)    seizures   Sulfa Antibiotics Nausea And Vomiting     Home Medications  Prior to Admission medications   Medication Sig Start Date End Date Taking? Authorizing Provider  cetirizine (ZYRTEC) 10 MG tablet Take 10 mg by mouth daily. 07/09/17   [provider]  promethazine (PHENERGAN) 25 MG tablet Take 1 tablet (25 mg total) by mouth every 6 (six) hours as needed for nausea or vomiting. 08/11/17   Houston Siren, MD  propranolol ER (INDERAL LA) 60 MG 24 hr capsule Take 1 capsule (60 mg total) by mouth daily. 07/20/17   Altamese Dilling, MD     Critical care time: 40 minutes      Zada Girt, AGNP  Pulmonary/Critical Care Pager [phone] (please enter 7 digits) PCCM Consult Pager [phone] (please enter 7 digits)  ,

## 2022-04-27 NOTE — ED Notes (Signed)
Pt in ct 

## 2022-04-27 NOTE — Progress Notes (Signed)
Pharmacy Antibiotic Note  Stephanie Skinner is a 66 y.o. female admitted on 04/27/2022 with  aspiration PNA .  Pharmacy has been consulted for Unasyn dosing.  Plan: Unasyn 3 gm IV Q6H ordered to start on 2330.   Height: 5\' 6"  (167.6 cm) Weight: 63.5 kg (140 lb) IBW/kg (Calculated) : 59.3  Temp (24hrs), Avg:98.4 F (36.9 C), Min:97.5 F (36.4 C), Max:99.8 F (37.7 C)  Recent Labs  Lab 04/26/22 2113  WBC 7.4  CREATININE 0.63    Estimated Creatinine Clearance: 63.9 mL/min (by C-G formula based on SCr of 0.63 mg/dL).    Allergies  Allergen Reactions   Codeine Nausea And Vomiting   Morphine And Related Other (See Comments)    seizures   Sulfa Antibiotics Nausea And Vomiting    Antimicrobials this admission:   >>    >>   Dose adjustments this admission:   Microbiology results:  BCx:   UCx:    Sputum:    MRSA PCR:   Thank you for allowing pharmacy to be a part of this patient's care.  Laveta Gilkey D 04/27/2022 11:33 PM

## 2022-04-27 NOTE — ED Provider Notes (Signed)
[facility] Provider Note    Event Date/Time   First MD Initiated Contact with Patient 04/27/22 [id number]     (approximate)   History   Altered Mental Status   HPI  Stephanie Skinner is a 66 y.o. female with history of substance abuse who presents to the emergency department altered mental status.  Patient is not able to provide any history.  Spoke with Dorene Sorrow her roommate by phone.  States that she has been "more spacy" over the past several days, 2 days ago didn't know where she was.  Slowly memory improved but then worsened again today.  He is not aware of any seizure-like activity, head trauma, infectious symptoms.  Still drinks daily.  States that she has at least 2 beers a day.  No known illicit drug use.  Ran out of Xanax per her roommate.  On suboxone, adderrall per the [place] controlled substance reporting system.  Spoke with daughter by phone.  She states that the patient is on an antihypertensive and antidepressant but daughter says she does not know the name of these medications.  Daughter states she is an "addict she was 77" and she has gotten in touch with social services three times recently.  She states that patient does not have any diagnosed psychiatric illness but she states she thinks that the patient is bipolar.   History provided by roommate and daughter, level V caveat due to AMS.    Past Medical History:  Diagnosis Date   Arthritis    Hepatitis C    pt states she has been cured   Hypertension    Seizures (HCC)     Past Surgical History:  Procedure Laterality Date   ABDOMINAL HYSTERECTOMY     "partial hysterectomy"   ELBOW SURGERY      MEDICATIONS:  Prior to Admission medications   Medication Sig Start Date End Date Taking? Authorizing Provider  cetirizine (ZYRTEC) 10 MG tablet Take 10 mg by mouth daily. 07/09/17   [provider]  promethazine (PHENERGAN) 25 MG tablet Take 1 tablet (25 mg total) by mouth every 6  (six) hours as needed for nausea or vomiting. 08/11/17   Houston Siren, MD  propranolol ER (INDERAL LA) 60 MG 24 hr capsule Take 1 capsule (60 mg total) by mouth daily. 07/20/17   Altamese Dilling, MD    Physical Exam   Triage Vital Signs: ED Triage Vitals  Enc Vitals Group     BP 04/26/22 2102 (!) 133/93     Pulse Rate 04/26/22 2102 85     Resp 04/26/22 2102 18     Temp 04/26/22 2102 98.6 F (37 C)     Temp Source 04/26/22 2102 Oral     SpO2 04/26/22 2102 96 %     Weight 04/26/22 2103 140 lb (63.5 kg)     Height 04/26/22 2103 5\' 6"  (1.676 m)     Head Circumference --      Peak Flow --      Pain Score 04/26/22 2103 0     Pain Loc --      Pain Edu? --      Excl. in GC? --     Most recent vital signs: Vitals:   04/27/22 0712 04/27/22 0828  BP: (!) 161/87 138/84  Pulse:  82  Resp:  18  Temp: 98.2 F (36.8 C) 98.2 F (36.8 C)  SpO2:  96%    CONSTITUTIONAL: Alert, oriented to person and  time but states she is in a [place] home.  Will not follow commands or answer all questions.  Is looking up towards the ceiling throughout the exam. HEAD: Normocephalic, atraumatic EYES: Conjunctivae clear, pupils appear equal, sclera nonicteric ENT: normal nose; moist mucous membranes NECK: Supple, normal ROM CARD: RRR; S1 and S2 appreciated; no murmurs, no clicks, no rubs, no gallops RESP: Normal chest excursion without splinting or tachypnea; breath sounds clear and equal bilaterally; no wheezes, no rhonchi, no rales, no hypoxia or respiratory distress, speaking full sentences ABD/GI: Normal bowel sounds; non-distended; soft, non-tender, no rebound, no guarding, no peritoneal signs BACK: The back appears normal EXT: Normal ROM in all joints; no deformity noted, no edema; no cyanosis SKIN: Normal color for age and race; warm; no rash on exposed skin NEURO: Moves all extremities equally, normal speech, no hyperreflexia, no clonus, no facial asymmetry appreciated, unable to  reliably test sensation or visual fields, no seizure-like activity PSYCH: The patient's mood and manner are appropriate.   ED Results / Procedures / Treatments   LABS: (all labs ordered are listed, but only abnormal results are displayed) Labs Reviewed  COMPREHENSIVE METABOLIC PANEL - Abnormal; Notable for the following components:      Result Value   Sodium 132 (*)    Potassium 3.2 (*)    Chloride 92 (*)    Glucose, Bld 130 (*)    Total Protein 8.9 (*)    AST 67 (*)    All other components within normal limits  SALICYLATE LEVEL - Abnormal; Notable for the following components:   Salicylate Lvl <7.0 (*)    All other components within normal limits  ACETAMINOPHEN LEVEL - Abnormal; Notable for the following components:   Acetaminophen (Tylenol), Serum <10 (*)    All other components within normal limits  URINE DRUG SCREEN, QUALITATIVE ([facility] ONLY) - Abnormal; Notable for the following components:   Benzodiazepine, Ur Scrn POSITIVE (*)    All other components within normal limits  URINALYSIS, ROUTINE W REFLEX MICROSCOPIC - Abnormal; Notable for the following components:   Color, Urine YELLOW (*)    APPearance HAZY (*)    Ketones, ur 20 (*)    All other components within normal limits  BLOOD GAS, ARTERIAL - Abnormal; Notable for the following components:   pO2, Arterial 66 (*)    Bicarbonate 31.3 (*)    Acid-Base Excess 6.4 (*)    All other components within normal limits  CBG MONITORING, ED - Abnormal; Notable for the following components:   Glucose-Capillary 127 (*)    All other components within normal limits  URINE CULTURE  ETHANOL  CBC  AMMONIA  TSH  T4, FREE  MAGNESIUM  TROPONIN I (HIGH SENSITIVITY)  TROPONIN I (HIGH SENSITIVITY)     EKG:  EKG Interpretation  Date/Time:  Friday April 26 2022 21:34:26 EDT Ventricular Rate:  74 PR Interval:  154 QRS Duration: 78 QT Interval:  436 QTC Calculation: 483 R Axis:   5 Text Interpretation: Normal sinus rhythm  Normal ECG When compared with ECG of 12-Feb-2022 14:57, PREVIOUS ECG IS PRESENT Confirmed by Rochele Raring [phone]) on 04/27/2022 4:43:49 AM         RADIOLOGY: My personal review and interpretation of imaging: CT head x2 unremarkable.  Chest x-ray clear.  I have personally reviewed all radiology reports.   CT Head Wo Contrast  Result Date: 04/27/2022 CLINICAL DATA:  66 year old female with altered mental status. EXAM: CT HEAD WITHOUT CONTRAST TECHNIQUE: Contiguous axial images were  obtained from the base of the skull through the vertex without intravenous contrast. RADIATION DOSE REDUCTION: This exam was performed according to the departmental dose-optimization program which includes automated exposure control, adjustment of the mA and/or kV according to patient size and/or use of iterative reconstruction technique. COMPARISON:  Head CT 04/26/2022. FINDINGS: Brain: No midline shift, ventriculomegaly, mass effect, evidence of mass lesion, intracranial hemorrhage or evidence of cortically based acute infarction. Patchy mostly anterior frontal lobe periventricular white matter hypodensity appears stable. Otherwise gray-white matter differentiation is within normal limits. Vascular: Calcified atherosclerosis at the skull base. No suspicious intracranial vascular hyperdensity. Skull: No acute osseous abnormality identified. Sinuses/Orbits: Visualized paranasal sinuses and mastoids are stable and well aerated. Other: Visualized orbits and scalp soft tissues are within normal limits. IMPRESSION: 1. No acute intracranial abnormality. 2. Stable since yesterday, with mild nonspecific white matter changes. Electronically Signed   By: Odessa Fleming M.D.   On: 04/27/2022 05:58   DG Chest 2 View  Result Date: 04/26/2022 CLINICAL DATA:  Altered mental status.  Chest pain. EXAM: CHEST - 2 VIEW COMPARISON:  Chest radiograph dated 02/12/2022. FINDINGS: Background of emphysema. No focal consolidation, pleural effusion or  pneumothorax. The cardiac silhouette is within limits. Atherosclerotic calcification of the aortic arch. No acute osseous pathology. Old healed right posterior rib fractures. IMPRESSION: No active cardiopulmonary disease. Emphysema. Electronically Signed   By: Elgie Collard M.D.   On: 04/26/2022 21:37   CT HEAD WO CONTRAST ( )  Result Date: 04/26/2022 CLINICAL DATA:  Mental status change, unknown cause EXAM: CT HEAD WITHOUT CONTRAST TECHNIQUE: Contiguous axial images were obtained from the base of the skull through the vertex without intravenous contrast. RADIATION DOSE REDUCTION: This exam was performed according to the departmental dose-optimization program which includes automated exposure control, adjustment of the mA and/or kV according to patient size and/or use of iterative reconstruction technique. COMPARISON:  CT head 02/12/2022 BRAIN: BRAIN Cerebral ventricle sizes are concordant with the degree of cerebral volume loss. Patchy and confluent areas of decreased attenuation are noted throughout the deep and periventricular white matter of the cerebral hemispheres bilaterally, compatible with chronic microvascular ischemic disease. No evidence of large-territorial acute infarction. No parenchymal hemorrhage. No mass lesion. No extra-axial collection. No mass effect or midline shift. No hydrocephalus. Basilar cisterns are patent. Vascular: No hyperdense vessel. Skull: No acute fracture or focal lesion. Sinuses/Orbits: Paranasal sinuses and mastoid air cells are clear. The orbits are unremarkable. Other: None. IMPRESSION: No acute intracranial abnormality. Electronically Signed   By: Tish Frederickson M.D.   On: 04/26/2022 21:31     PROCEDURES:  Critical Care performed: Yes, see critical care procedure note(s)   CRITICAL CARE Performed by: Baxter Hire Junette Bernat   Total critical care time: 45 minutes  Critical care time was exclusive of separately billable procedures and treating other  patients.  Critical care was necessary to treat or prevent imminent or life-threatening deterioration.  Critical care was time spent personally by me on the following activities: development of treatment plan with patient and/or surrogate as well as nursing, discussions with consultants, evaluation of patient's response to treatment, examination of patient, obtaining history from patient or surrogate, ordering and performing treatments and interventions, ordering and review of laboratory studies, ordering and review of radiographic studies, pulse oximetry and re-evaluation of patient's condition.   Marland Kitchen1-3 Lead EKG Interpretation  Performed by: Jamaal Bernasconi, Layla Maw, DO Authorized by: Denzell Colasanti, Layla Maw, DO     Interpretation: normal     ECG rate:  91  ECG rate assessment: normal     Rhythm: sinus rhythm     Ectopy: none     Conduction: normal       IMPRESSION / MDM / ASSESSMENT AND PLAN / ED COURSE  I reviewed the triage vital signs and the nursing notes.    Patient here with altered mental status.  The patient is on the cardiac monitor to evaluate for evidence of arrhythmia and/or significant heart rate changes.   DIFFERENTIAL DIAGNOSIS (includes but not limited to):   Wernicke's encephalopathy, withdrawal, polypharmacy, UTI, hepatic encephalopathy, intracranial hemorrhage, stroke thyroid dysfunction, electrolyte derangement, decompensated psychiatric illness, hypercapnia   Patient's presentation is most consistent with acute presentation with potential threat to life or bodily function.   PLAN: Patient's labs show potassium 3.2.  Getting IV replacement as she does have a prolonged QT interval.  Magnesium level normal.  Normal ammonia, TSH, glucose.  Alcohol, Tylenol and salicylate levels negative.  Urine shows no sign of infection.  Blood gas is unremarkable.  Initial head CT obtained in triage reviewed and interpreted by myself and the radiologist and shows no acute abnormality.  Chest  x-ray clear.  Nurse at bedside states that she appears to have worsened since being in triage.  Will repeat head CT and blood glucose.  We will also repeat EKG.   MEDICATIONS GIVEN IN ED: Medications  0.9 %  sodium chloride infusion ( Intravenous New Bag/Given 04/27/22 0524)  LORazepam (ATIVAN) tablet 1-4 mg (has no administration in time range)    Or  LORazepam (ATIVAN) injection 1-4 mg (has no administration in time range)  thiamine (VITAMIN B1) tablet 100 mg (has no administration in time range)    Or  thiamine (VITAMIN B1) injection 100 mg (has no administration in time range)  folic acid (FOLVITE) tablet 1 mg (has no administration in time range)  multivitamin with minerals tablet 1 tablet (has no administration in time range)  LORazepam (ATIVAN) tablet 0-4 mg (has no administration in time range)    Followed by  LORazepam (ATIVAN) tablet 0-4 mg (has no administration in time range)  thiamine (VITAMIN B1) injection 100 mg (100 mg Intravenous Given 04/27/22 0524)  potassium chloride 10 mEq in 100 mL IVPB (0 mEq Intravenous Stopped 04/27/22 0804)     ED COURSE: Repeat head CT reviewed and interpreted by myself and radiologist and shows no acute new abnormality.  CBG normal.  Again no seizure-like activity.  She has received thiamine for possible Wernicke's encephalopathy.  Will discuss with hospitalist for admission.   CONSULTS:  Consulted and discussed patient's case with hospitalist, Dr. Joylene Igo.  I have recommended admission and consulting physician agrees and will place admission orders.  Patient (and family if present) agree with this plan.   I reviewed all nursing notes, vitals, pertinent previous records.  All labs, EKGs, imaging ordered have been independently reviewed and interpreted by myself.    OUTSIDE RECORDS REVIEWED: Reviewed patient's last office visit with family medicine in November 2022.       FINAL CLINICAL IMPRESSION(S) / ED DIAGNOSES   Final diagnoses:   Altered mental status, unspecified altered mental status type     Rx / DC Orders   ED Discharge Orders     None        Note:  This document was prepared using Dragon voice recognition software and may include unintentional dictation errors.   Marsa Matteo, Layla Maw, DO 04/27/22 [phone]

## 2022-04-27 NOTE — Progress Notes (Incomplete)
Acute Hypoxic Respiratory Failure secondary to suspected aspiration in the setting of altered mental status MRI wo contrast 04/27/22: no acute intracranial abnormality. Mild chronic microvascular ischemic disease. - Ventilator settings: PRVC  8 mL/kg, 45% FiO2, 5 PEEP, continue ventilator support & lung protective strategies - Wean PEEP & FiO2 as tolerated, maintain SpO2 > 90% - Head of bed elevated 30 degrees, VAP protocol in place - Plateau pressures less than 30 cm H20  - Intermittent chest x-ray & ABG PRN - Daily WUA with SBT as tolerated  - Ensure adequate pulmonary hygiene  - F/u cultures, trend PCT - Continue Aspiration Pna coverage: unasyn - Bronchodilators PRN - PAD protocol in place: continue Fentanyl drip & Propofol drip  Updated daughter to current   Cheryll Cockayne Rust-Chester, AGACNP-BC Acute Care Nurse Practitioner [facility]   [phone] / [phone] Please see Amion for pager details.

## 2022-04-28 DIAGNOSIS — R410 Disorientation, unspecified: Secondary | ICD-10-CM | POA: Diagnosis not present

## 2022-04-28 LAB — COMPREHENSIVE METABOLIC PANEL
ALT: 19 U/L (ref 0–44)
AST: 25 U/L (ref 15–41)
Albumin: 2.8 g/dL — ABNORMAL LOW (ref 3.5–5.0)
Alkaline Phosphatase: 57 U/L (ref 38–126)
Anion gap: 9 (ref 5–15)
BUN: 6 mg/dL — ABNORMAL LOW (ref 8–23)
CO2: 26 mmol/L (ref 22–32)
Calcium: 8.3 mg/dL — ABNORMAL LOW (ref 8.9–10.3)
Chloride: 102 mmol/L (ref 98–111)
Creatinine, Ser: 0.44 mg/dL (ref 0.44–1.00)
GFR, Estimated: >60 mL/min (ref >60)
Glucose, Bld: 138 mg/dL — ABNORMAL HIGH (ref 70–99)
Potassium: 3.9 mmol/L (ref 3.5–5.1)
Sodium: 137 mmol/L (ref 135–145)
Total Bilirubin: 0.5 mg/dL (ref 0.3–1.2)
Total Protein: 6 g/dL — ABNORMAL LOW (ref 6.5–8.1)

## 2022-04-28 LAB — RESPIRATORY PANEL BY PCR

## 2022-04-28 LAB — CBC WITH DIFFERENTIAL/PLATELET
Abs Immature Granulocytes: 0.05 10*3/uL (ref 0.00–0.07)
Basophils Absolute: 0 10*3/uL (ref 0.0–0.1)
Basophils Relative: 0 %
Eosinophils Absolute: 0 10*3/uL (ref 0.0–0.5)
Eosinophils Relative: 0 %
HCT: 33.4 % — ABNORMAL LOW (ref 36.0–46.0)
Hemoglobin: 11 g/dL — ABNORMAL LOW (ref 12.0–15.0)
Immature Granulocytes: 1 %
Lymphocytes Relative: 9 %
Lymphs Abs: 0.9 10*3/uL (ref 0.7–4.0)
MCH: 31.5 pg (ref 26.0–34.0)
MCHC: 32.9 g/dL (ref 30.0–36.0)
MCV: 95.7 fL (ref 80.0–100.0)
Monocytes Absolute: 0.2 10*3/uL (ref 0.1–1.0)
Monocytes Relative: 3 %
Neutro Abs: 8.2 10*3/uL — ABNORMAL HIGH (ref 1.7–7.7)
Neutrophils Relative %: 87 %
Platelets: 188 10*3/uL (ref 150–400)
RBC: 3.49 MIL/uL — ABNORMAL LOW (ref 3.87–5.11)
RDW: 13.3 % (ref 11.5–15.5)
WBC: 9.4 10*3/uL (ref 4.0–10.5)
nRBC: 0 % (ref 0.0–0.2)

## 2022-04-28 LAB — GLUCOSE, CAPILLARY
Glucose-Capillary: 110 mg/dL — ABNORMAL HIGH (ref 70–99)
Glucose-Capillary: 111 mg/dL — ABNORMAL HIGH (ref 70–99)
Glucose-Capillary: 131 mg/dL — ABNORMAL HIGH (ref 70–99)
Glucose-Capillary: 137 mg/dL — ABNORMAL HIGH (ref 70–99)
Glucose-Capillary: 164 mg/dL — ABNORMAL HIGH (ref 70–99)
Glucose-Capillary: 168 mg/dL — ABNORMAL HIGH (ref 70–99)
Glucose-Capillary: 187 mg/dL — ABNORMAL HIGH (ref 70–99)

## 2022-04-28 LAB — PHOSPHORUS: Phosphorus: 1.9 mg/dL — ABNORMAL LOW (ref 2.5–4.6)

## 2022-04-28 LAB — URINE CULTURE: Culture: NO GROWTH

## 2022-04-28 LAB — SARS CORONAVIRUS 2 BY RT PCR: SARS Coronavirus 2 by RT PCR: POSITIVE — AB

## 2022-04-28 LAB — MAGNESIUM: Magnesium: 2.1 mg/dL (ref 1.7–2.4)

## 2022-04-28 MED ORDER — VITAMIN C 500 MG PO TABS
500.0000 mg | ORAL_TABLET | Freq: Every day | ORAL | Status: DC
  Administered 2022-04-28 – 2022-04-30 (×3): 500 mg
  Filled 2022-04-28 (×3): qty 1

## 2022-04-28 MED ORDER — PROSOURCE TF20 ENFIT COMPATIBL EN LIQD
60.0000 mL | Freq: Three times a day (TID) | ENTERAL | Status: DC
  Administered 2022-04-28 – 2022-04-29 (×3): 60 mL
  Filled 2022-04-28: qty 60

## 2022-04-28 MED ORDER — SODIUM CHLORIDE 0.9% FLUSH
10.0000 mL | Freq: Two times a day (BID) | INTRAVENOUS | Status: DC
  Administered 2022-04-28 – 2022-05-03 (×10): 10 mL

## 2022-04-28 MED ORDER — MOLNUPIRAVIR EUA 200MG CAPSULE
4.0000 | ORAL_CAPSULE | Freq: Two times a day (BID) | ORAL | Status: DC
Start: 2022-04-28 — End: 2022-04-28

## 2022-04-28 MED ORDER — MOLNUPIRAVIR EUA 200MG CAPSULE
4.0000 | ORAL_CAPSULE | Freq: Two times a day (BID) | ORAL | Status: AC
  Administered 2022-04-28 – 2022-05-02 (×8): 800 mg via ORAL
  Filled 2022-04-28 (×2): qty 4

## 2022-04-28 MED ORDER — FREE WATER
30.0000 mL | Status: DC
  Administered 2022-04-28 – 2022-04-29 (×5): 30 mL

## 2022-04-28 MED ORDER — POTASSIUM PHOSPHATES 15 MMOLE/5ML IV SOLN
30.0000 mmol | Freq: Once | INTRAVENOUS | Status: AC
  Administered 2022-04-28: 30 mmol via INTRAVENOUS
  Filled 2022-04-28: qty 10

## 2022-04-28 MED ORDER — SODIUM CHLORIDE 0.9% FLUSH: 10.0000 mL | INTRAVENOUS | Status: DC | PRN

## 2022-04-28 MED ORDER — METHYLPREDNISOLONE SODIUM SUCC 40 MG IJ SOLR
0.5000 mg/kg | Freq: Two times a day (BID) | INTRAMUSCULAR | Status: DC
  Administered 2022-04-28 – 2022-04-29 (×4): 31.6 mg via INTRAVENOUS
  Filled 2022-04-28 (×4): qty 1

## 2022-04-28 MED ORDER — VITAL HIGH PROTEIN PO LIQD
1000.0000 mL | ORAL | Status: DC
  Administered 2022-04-28: 1000 mL

## 2022-04-28 MED ORDER — INSULIN ASPART 100 UNIT/ML IJ SOLN
0.0000 [IU] | INTRAMUSCULAR | Status: DC
  Administered 2022-04-28: 3 [IU] via SUBCUTANEOUS
  Administered 2022-04-28: 2 [IU] via SUBCUTANEOUS
  Administered 2022-04-28: 3 [IU] via SUBCUTANEOUS
  Administered 2022-04-28 – 2022-04-29 (×4): 2 [IU] via SUBCUTANEOUS
  Administered 2022-04-29: 3 [IU] via SUBCUTANEOUS
  Administered 2022-04-29: 2 [IU] via SUBCUTANEOUS
  Administered 2022-04-29: 3 [IU] via SUBCUTANEOUS
  Administered 2022-04-30 – 2022-05-01 (×2): 2 [IU] via SUBCUTANEOUS
  Filled 2022-04-28 (×13): qty 1

## 2022-04-28 MED ORDER — ZINC SULFATE 220 (50 ZN) MG PO CAPS
220.0000 mg | ORAL_CAPSULE | Freq: Every day | ORAL | Status: DC
  Administered 2022-04-28 – 2022-04-30 (×3): 220 mg
  Filled 2022-04-28 (×3): qty 1

## 2022-04-28 MED ORDER — PREDNISONE 10 MG PO TABS: 50.0000 mg | ORAL_TABLET | Freq: Every day | ORAL | Status: DC

## 2022-04-28 NOTE — Progress Notes (Signed)
An USGPIV (ultrasound guided PIV) has been placed for short-term vasopressor infusion. A correctly placed ivWatch must be used when administering Vasopressors. Should this treatment be needed beyond 72 hours, central line access should be obtained.  It will be the responsibility of the bedside nurse to follow best practice to prevent extravasations.   ?

## 2022-04-28 NOTE — Progress Notes (Signed)
Peripherally Inserted Central Catheter Placement  The IV Nurse has discussed with the patient and/or persons authorized to consent for the patient, the purpose of this procedure and the potential benefits and risks involved with this procedure.  The benefits include less needle sticks, lab draws from the catheter, and the patient may be discharged home with the catheter. Risks include, but not limited to, infection, bleeding, blood clot (thrombus formation), and puncture of an artery; nerve damage and irregular heartbeat and possibility to perform a PICC exchange if needed/ordered by physician.  Alternatives to this procedure were also discussed.  Bard Power PICC patient education guide, fact sheet on infection prevention and patient information card has been provided to patient /or left at bedside.    PICC Placement Documentation  PICC Triple Lumen 04/28/22 Right Basilic 39 cm 1 cm (Active)  Indication for Insertion or Continuance of Line Administration of hyperosmolar/irritating solutions (i.e. TPN, Vancomycin, etc.);Limited venous access - need for IV therapy >5 days (PICC only) 04/28/22 1627  Exposed Catheter (cm) 1 cm 04/28/22 1627  Site Assessment Clean, Dry, Intact 04/28/22 1627  Lumen #1 Status Saline locked;Flushed;Blood return noted 04/28/22 1627  Lumen #2 Status Saline locked;Flushed;Blood return noted 04/28/22 1627  Lumen #3 Status Saline locked;Flushed;Blood return noted 04/28/22 1627  Dressing Type Transparent;Securing device 04/28/22 1627  Dressing Status Antimicrobial disc in place;Clean, Dry, Intact 04/28/22 1627  Safety Lock Not Applicable 04/28/22 1627  Line Care Connections checked and tightened 04/28/22 1627  Line Adjustment (NICU/IV Team Only) No 04/28/22 1627  Dressing Intervention New dressing 04/28/22 1627  Dressing Change Due 05/05/22 04/28/22 1627       Burnard Bunting Chenice 04/28/2022, 4:30 PM

## 2022-04-28 NOTE — Progress Notes (Signed)
NAME:  Stephanie Skinner, MRN:  [id number], DOB:  1956-02-19, LOS: 1 ADMISSION DATE:  04/27/2022, CONSULTATION DATE: 04/27/22 REFERRING MD: Dr. Joylene Igo, CHIEF COMPLAINT: Delirium    History of Present Illness:  This is a 66 yo female with a hx of ETOH Abuse who presented to [facility] ER on 09/1 via EMS with auditory and visual hallucinations, onset of symptoms several days prior to presentation.  Per ER notes EMS reported pts roommate called EMS, and upon their arrival pt alert to self and place only.  Pt also had vomiting episode, weakness, and difficulty with abulation.  Pt also endorsed chest pain during the earlier part of the day.  Pts roommate reports pt recently ran out of her xanax and drinks 2 beers daily.  Pts daughter reports the pt does not have any diagnosed psychiatric illnesses, however she is concerned the pt has undiagnosed Bipolar Disorder.    ED Course  In the ER CT Head negative.  Initial EKG revealed NSR, heart rate 74 with prolonged Qtc 483.  Lab results: Na+ 132, K+ 3.2, chloride 92, glucose 130, AST 67, tylenol level <10, salicylate level <7.0, and UA negative for UTI.  CXR negative for acute cardiopulmonary disease, but revealed emphysema.  While in the ER pts mentation continued to decline repeat CT Head on 09/2 negative and EKG revealed normal sinus rhythm with worsening prolonged Qtc 528.  Pt received 100 mg of iv thiamine due to concern of possible wernike's encephalopathy.  Pt subsequently admitted to the medsurg unit per hospitalist team for additional workup and treatment.  Hospital Course  On 09/2 pt developed worsening agitation/delirium requiring transfer to the stepdown unit for precedex gtt and closer monitoring.  PCCM team consulted to assist with management.   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  PCCM team consulted to assist with management  09/03: Overnight required intubation due to altered mental status and inability to protect airway.  There was questionable aspiration also tested positive for COVID-19  Interim History / Subjective:  Sedated, intubated and mechanically ventilated.  Cannot assess further.  She is synchronous with the ventilator.  Hemodynamically stable.  Objective   Blood pressure (!) 136/55, pulse (!) 55, temperature 97.9 F (36.6 C), resp. rate 14, height 5\' 6"  (1.676 m), weight 61.8 kg, SpO2 95 %.    Vent Mode: PRVC FiO2 (%):  [35 %-45 %] 35 % Set Rate:  [14 bmp-16 bmp] 14 bmp Vt Set:  [450 mL] 450 mL PEEP:  [5 cmH20] 5 cmH20 Plateau Pressure:  [16 cmH20] 16 cmH20   Intake/Output Summary (Last 24 hours) at 04/28/2022 0827 Last data filed at 04/28/2022 0700 Gross per 24 hour  Intake 3913.11 ml  Output 1225 ml  Net 2688.11 ml    Filed Weights   04/26/22 2103 04/28/22 0500  Weight: 63.5 kg 61.8 kg    Examination: General: Acute on chronically-ill appearing female, in mild respiratory distress  HENT: Supple, no JVD  Lungs: Rhonchi throughout, no wheezes Cardiovascular: Sinus tachycardia, s1s2, no r/g, 2+ radial/1+ distal pulses, trace to +1 bilateral lower extremity edema  Abdomen: +BS x4,  soft, non distended  Extremities: Scattered bilateral lower extremity abrasions with ecchymosis, SCDs in place Neuro: Sedated, intubated, not following commands due to sedation, PERRL GU: Foley catheter present inserted after extubation, draining clear urine, necessary  Resolved Hospital Problem list     Assessment & Plan:  Acute encephalopathy secondary to ETOH/Benzodiazepine Withdrawal  complicated by 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 following possible seizure activity  Elevated AST  QTc prolongation  - Continue phenobarbital taper and start standing dose of 1 mg iv ativan q8hrs; will avoid haldol given Qtc prolongation  - If agitation/delirium worsens will start precedex gtt  - High-dose thiamine was completed days followed by 100 mg daily  - Continue MVI infusion  - CIWA protocol  - Continuous telemetry monitoring  - Once mentation improves will need psychiatry consult and polysubstance abuse cessation counseling  - Trend hepatic function  - MRI Brain and EEG pending  - Will start depacon for possible seizure activity  - Pt HIGH RISK for mechanical intubation for airway protection   HTN - Prn hydralazine for bp management   Hyponatremia likely in the setting of hypovolemia  Hypokalemia  - Trend BMP  - Replace electrolytes as indicated  - Monitor UOP  - IV fluid resuscitation   Best Practice (right click and "Reselect all SmartList Selections" daily)   Diet/type: NPO DVT prophylaxis: LMWH GI prophylaxis: PPI Lines: N/A Foley:  N/A Code Status:  full code Last date of multidisciplinary goals of care discussion [N/A]  Labs   CBC: Recent Labs  Lab 04/26/22 2113  WBC 7.4  HGB 12.7  HCT 38.5  MCV 94.6  PLT 235     Basic Metabolic Panel: Recent Labs  Lab 04/26/22 2113 04/27/22 0525 04/27/22 1512  NA 132*  --   --   K 3.2*  --  3.4*  CL 92*  --   --   CO2 30  --   --   GLUCOSE 130*  --   --   BUN 16  --   --   CREATININE 0.63  --   --   CALCIUM 9.1  --   --   MG  --  2.4  --   PHOS  --  2.8  --     GFR: Estimated Creatinine Clearance: 63.9 mL/min (by C-G formula based on SCr of 0.63 mg/dL). Recent Labs  Lab 04/26/22 2113 04/27/22 1512  PROCALCITON  --  <0.10  WBC 7.4  --      Liver Function Tests: Recent Labs  Lab 04/26/22 2113  AST 67*  ALT 35  ALKPHOS 91  BILITOT 0.9  PROT 8.9*  ALBUMIN 4.8     No results for input(s): "LIPASE", "AMYLASE" in the last 168 hours. Recent Labs  Lab 04/27/22 0525  AMMONIA 18     ABG    Component Value Date/Time   PHART 7.45 04/27/2022 0525   PCO2ART 45 04/27/2022 0525   PO2ART 66 (L) 04/27/2022 0525   HCO3 31.3 (H) 04/27/2022 0525   O2SAT 94.4 04/27/2022 0525     Coagulation Profile: No results for input(s): "INR", "PROTIME" in the last 168 hours.  Cardiac Enzymes: No results for input(s): "CKTOTAL", "CKMB", "CKMBINDEX", "TROPONINI" in the last 168 hours.  HbA1C: No results found for: "HGBA1C"  CBG: Recent Labs  Lab 04/27/22 0520 04/27/22 1216 04/27/22 1943 04/28/22 0028 04/28/22 0318  GLUCAP 127* 137* 102* 110* 131*     Review of Systems:   Unable to assess pt sedated  and unable to participate   Past Medical History:  She,  has a past medical history of Arthritis, Hepatitis C, Hypertension, and Seizures (HCC).   Surgical History:   Past Surgical History:  Procedure Laterality Date   ABDOMINAL HYSTERECTOMY     "partial hysterectomy"   ELBOW SURGERY       Social History:   reports that she has been smoking cigarettes. She has been smoking an average of 1 pack per day. She has never used smokeless tobacco. She reports current alcohol use. She reports that she does not use drugs.   Family History:  Her family history includes Colon cancer in her sister; Diabetes in her sister.   Allergies Allergies  Allergen Reactions   Codeine Nausea And Vomiting   Morphine And Related Other (See Comments)    seizures   Sulfa Antibiotics Nausea And Vomiting     Home Medications  Prior to Admission medications   Medication Sig Start Date End Date Taking? Authorizing Provider  cetirizine (ZYRTEC) 10 MG tablet Take 10 mg by mouth daily. 07/09/17   [provider]  promethazine (PHENERGAN) 25 MG tablet Take 1 tablet (25 mg total) by mouth every 6 (six) hours as needed for nausea or vomiting. 08/11/17   Houston Siren, MD  propranolol ER (INDERAL LA) 60 MG 24 hr capsule Take 1 capsule (60 mg total) by mouth daily. 07/20/17   Altamese Dilling, MD    Scheduled Meds:  vitamin C  500 mg Per Tube Daily   Chlorhexidine Gluconate Cloth  6 each Topical Q0600   docusate  100 mg Per Tube BID   etomidate  20 mg Intravenous Once   feeding supplement (PROSource TF20)  60 mL Per Tube TID   feeding supplement (VITAL HIGH PROTEIN)  1,000 mL Per Tube Q24H   fentaNYL (SUBLIMAZE) injection  100 mcg Intravenous Once   fentaNYL (SUBLIMAZE) injection  100 mcg Intravenous Once   free water  30 mL Per Tube Q4H   insulin aspart  0-15 Units Subcutaneous Q4H   LORazepam  1 mg Intravenous Q8H   methylPREDNISolone (SOLU-MEDROL) injection  0.5 mg/kg Intravenous Q12H   Followed by   Melene Muller ON 05/01/2022] predniSONE  50 mg Stephanie Daily   midazolam  4 mg Intravenous Once   molnupiravir EUA  4 capsule Stephanie BID   mouth rinse  15 mL Mouth Rinse Q2H   pantoprazole  40 mg Per Tube Daily   polyethylene glycol  17 g Per Tube Daily   sodium chloride flush  10-40 mL Intracatheter Q12H   zinc sulfate  220 mg Per Tube Daily   Continuous Infusions:  sodium chloride Stopped (04/28/22 0629)   ampicillin-sulbactam (UNASYN) IV 3 g (04/28/22 1110)   dexmedetomidine (PRECEDEX) IV infusion Stopped (04/27/22 2214)   dextrose 5 % and 0.45% NaCl 1,000 mL with potassium chloride 40 mEq, folic acid 1 mg, M.V.I. Adult (INFUVITE ADULT) 10 mL infusion 100 mL/hr at 04/28/22 1111   fentaNYL infusion INTRAVENOUS 75 mcg/hr (04/28/22 0700)   norepinephrine (LEVOPHED) Adult infusion 2 mcg/min (04/28/22 0700)   propofol (DIPRIVAN) infusion 45 mcg/kg/min (04/28/22 1112)   thiamine (VITAMIN B1) injection 500 mg (04/28/22 1636)   valproate sodium 250 mg (04/28/22 1437)   PRN Meds:.acetaminophen **OR** acetaminophen, fentaNYL, hydrALAZINE, midazolam, midazolam, ondansetron **OR** ondansetron (ZOFRAN) IV, mouth rinse, sodium chloride flush  Critical  care time: 40 minutes      The patient is critically ill with multiple organ systems failure and requires high  complexity decision making for assessment and support, frequent evaluation and titration of therapies, application of advanced monitoring technologies and extensive interpretation of multiple databases.   Gailen Shelter, MD Advanced Bronchoscopy PCCM [facility]    *This note was dictated using voice recognition software/Dragon.  Despite best efforts to proofread, errors can occur which can change the meaning. Any transcriptional errors that result from this process are unintentional and may not be fully corrected at the time of dictation. ,

## 2022-04-28 NOTE — Care Plan (Signed)
Patient remains under ICU care.  Overnight patient was intubated due to respiratory distress. Please let us know when [facility] resumes care.

## 2022-04-28 NOTE — Progress Notes (Signed)
Pharmacy Antibiotic Note  Stephanie Skinner is a 66 y.o. female admitted on 04/27/2022 with  aspiration PNA .  Pharmacy has been consulted for Unasyn dosing.  -covid+  Plan: Continue Unasyn 3 gm IV Q6H     Height: 5\' 6"  (167.6 cm) Weight: 61.8 kg (136 lb 3.9 oz) IBW/kg (Calculated) : 59.3  Temp (24hrs), Avg:98.8 F (37.1 C), Min:95.9 F (35.5 C), Max:99.8 F (37.7 C)  Recent Labs  Lab 04/26/22 2113  WBC 7.4  CREATININE 0.63     Estimated Creatinine Clearance: 63.9 mL/min (by C-G formula based on SCr of 0.63 mg/dL).    Allergies  Allergen Reactions   Codeine Nausea And Vomiting   Morphine And Related Other (See Comments)    seizures   Sulfa Antibiotics Nausea And Vomiting    Antimicrobials this admission:   Unasyn 9/2>>    >>   Dose adjustments this admission:   Microbiology results:  BCx:   UCx: pend   Sputum:    MRSA PCR: neg 9/3 REsp panel: neg 9/2 covid+  Thank you for allowing pharmacy to be a part of this patient's care.  Shmiel Morton A 04/28/2022 8:18 AM

## 2022-04-28 NOTE — Progress Notes (Signed)
Initial Nutrition Assessment  DOCUMENTATION CODES:   Not applicable  INTERVENTION:   Initiate Vital High Protein @ 25 ml/hr via OGT  60 ml Prosource TF 20 TID  30 ml free water flush every 4 hours to maintain tube patency   Tube feeding regimen provides 840 kcal, 112 grams of protein, and 502 ml of H2O. Total free water: 682 ml daily  TF + propofol to provide 1292 kcals  NUTRITION DIAGNOSIS:   Inadequate oral intake related to inability to eat as evidenced by NPO status.  GOAL:   Patient will meet greater than or equal to 90% of their needs  MONITOR:   Vent status, TF tolerance  REASON FOR ASSESSMENT:   Ventilator, Consult Assessment of nutrition requirement/status, Enteral/tube feeding initiation and management  ASSESSMENT:   Pt with medical history significant for " psychiatric issues possible bipolar disorder", history of polysubstance abuse, hypertension and seizures who was brought in after her roommate called for evaluation of mental status changes and hallucinations.  Pt admitted with delirium (auditory and visual hallucinations).   Patient is currently intubated on ventilator support. OGT placement verified by x-ray; currently clamped. MV: 6.1 L/min Temp (24hrs), Avg:98.6 F (37 C), Min:95.9 F (35.5 C), Max:99.8 F (37.7 C)  Propofol: 17.15 ml/hr (provides 452 kcals daily)  Reviewed I/O's: +2.9 L x 24 hours  UOP: 1.2 L x 24 hours  Pt found to be COVID-19 positive during admission.   Case discussed with MD and NP; received permission to start TF today.   Per H&P, pt with declining memory over the past 6 months. She also drinks a few beers daily.  Reviewed wt hx; pt has experienced a 2.7% wt loss over the past 3 months, which is not significant for time frame.   Medications reviewed and include colace, ativan, solu-medrol, miralax, zinc sulfate, depakon, thiamine, levophed, and dextrose 5% and 0.45% NaCl with KCl @ 100 ml/hr.  Labs reviewed:  CBGS: 110-187 (inpatient orders for glycemic control are 0-15 units insulin aspart every 4 hours).    Diet Order:   Diet Order             Diet NPO time specified  Diet effective now                   EDUCATION NEEDS:   No education needs have been identified at this time  Skin:  Skin Assessment: Reviewed RN Assessment  Last BM:  04/27/22  Height:   Ht Readings from Last 1 Encounters:  04/26/22 5\' 6"  (1.676 m)    Weight:   Wt Readings from Last 1 Encounters:  04/28/22 61.8 kg    Ideal Body Weight:  59.1 kg  BMI:  Body mass index is 21.99 kg/m.  Estimated Nutritional Needs:   Kcal:  06/28/22  Protein:  105-120 grams  Fluid:  > 1.2 L    [phone], RD, LDN, CDCES Registered Dietitian II Certified Diabetes Care and Education Specialist Please refer to [facility] for RD and/or RD on-call/weekend/after hours pager

## 2022-04-29 DIAGNOSIS — R410 Disorientation, unspecified: Secondary | ICD-10-CM | POA: Diagnosis not present

## 2022-04-29 LAB — COMPREHENSIVE METABOLIC PANEL
ALT: 19 U/L (ref 0–44)
AST: 21 U/L (ref 15–41)
Albumin: 2.6 g/dL — ABNORMAL LOW (ref 3.5–5.0)
Alkaline Phosphatase: 53 U/L (ref 38–126)
Anion gap: 6 (ref 5–15)
BUN: 11 mg/dL (ref 8–23)
CO2: 23 mmol/L (ref 22–32)
Calcium: 7.9 mg/dL — ABNORMAL LOW (ref 8.9–10.3)
Chloride: 105 mmol/L (ref 98–111)
Creatinine, Ser: 0.44 mg/dL (ref 0.44–1.00)
GFR, Estimated: >60 mL/min (ref >60)
Glucose, Bld: 130 mg/dL — ABNORMAL HIGH (ref 70–99)
Potassium: 4.8 mmol/L (ref 3.5–5.1)
Sodium: 134 mmol/L — ABNORMAL LOW (ref 135–145)
Total Bilirubin: 0.5 mg/dL (ref 0.3–1.2)
Total Protein: 5.7 g/dL — ABNORMAL LOW (ref 6.5–8.1)

## 2022-04-29 LAB — GLUCOSE, CAPILLARY
Glucose-Capillary: 119 mg/dL — ABNORMAL HIGH (ref 70–99)
Glucose-Capillary: 135 mg/dL — ABNORMAL HIGH (ref 70–99)
Glucose-Capillary: 142 mg/dL — ABNORMAL HIGH (ref 70–99)
Glucose-Capillary: 146 mg/dL — ABNORMAL HIGH (ref 70–99)
Glucose-Capillary: 148 mg/dL — ABNORMAL HIGH (ref 70–99)
Glucose-Capillary: 153 mg/dL — ABNORMAL HIGH (ref 70–99)

## 2022-04-29 LAB — CBC WITH DIFFERENTIAL/PLATELET
Abs Immature Granulocytes: 0.07 10*3/uL (ref 0.00–0.07)
Basophils Absolute: 0 10*3/uL (ref 0.0–0.1)
Basophils Relative: 0 %
Eosinophils Absolute: 0 10*3/uL (ref 0.0–0.5)
Eosinophils Relative: 0 %
HCT: 31.6 % — ABNORMAL LOW (ref 36.0–46.0)
Hemoglobin: 10.3 g/dL — ABNORMAL LOW (ref 12.0–15.0)
Immature Granulocytes: 1 %
Lymphocytes Relative: 11 %
Lymphs Abs: 1 10*3/uL (ref 0.7–4.0)
MCH: 31.8 pg (ref 26.0–34.0)
MCHC: 32.6 g/dL (ref 30.0–36.0)
MCV: 97.5 fL (ref 80.0–100.0)
Monocytes Absolute: 0.4 10*3/uL (ref 0.1–1.0)
Monocytes Relative: 5 %
Neutro Abs: 7.8 10*3/uL — ABNORMAL HIGH (ref 1.7–7.7)
Neutrophils Relative %: 83 %
Platelets: 171 10*3/uL (ref 150–400)
RBC: 3.24 MIL/uL — ABNORMAL LOW (ref 3.87–5.11)
RDW: 13.5 % (ref 11.5–15.5)
WBC: 9.3 10*3/uL (ref 4.0–10.5)
nRBC: 0 % (ref 0.0–0.2)

## 2022-04-29 LAB — TRIGLYCERIDES: Triglycerides: 61 mg/dL (ref <150)

## 2022-04-29 LAB — D-DIMER, QUANTITATIVE: D-Dimer, Quant: 1.09 ug/mL-FEU — ABNORMAL HIGH (ref 0.00–0.50)

## 2022-04-29 LAB — VALPROIC ACID LEVEL: Valproic Acid Lvl: 44 ug/mL — ABNORMAL LOW (ref 50.0–100.0)

## 2022-04-29 LAB — MAGNESIUM: Magnesium: 2 mg/dL (ref 1.7–2.4)

## 2022-04-29 LAB — FERRITIN: Ferritin: 69 ng/mL (ref 11–307)

## 2022-04-29 LAB — PROCALCITONIN: Procalcitonin: <0.1 ng/mL

## 2022-04-29 LAB — C-REACTIVE PROTEIN: CRP: 8.6 mg/dL — ABNORMAL HIGH (ref <1.0)

## 2022-04-29 LAB — PHOSPHORUS: Phosphorus: 4.1 mg/dL (ref 2.5–4.6)

## 2022-04-29 MED ORDER — FREE WATER
200.0000 mL | Status: DC
  Administered 2022-04-29 – 2022-04-30 (×4): 200 mL

## 2022-04-29 MED ORDER — LORAZEPAM 2 MG/ML IJ SOLN
1.0000 mg | Freq: Four times a day (QID) | INTRAMUSCULAR | Status: DC | PRN
  Administered 2022-04-30: 1 mg via INTRAVENOUS
  Filled 2022-04-29: qty 1

## 2022-04-29 MED ORDER — CLONAZEPAM 0.5 MG PO TABS
0.5000 mg | ORAL_TABLET | Freq: Two times a day (BID) | ORAL | Status: DC
  Administered 2022-04-29 – 2022-05-02 (×5): 0.5 mg
  Filled 2022-04-29 (×5): qty 1

## 2022-04-29 MED ORDER — ENOXAPARIN SODIUM 40 MG/0.4ML IJ SOSY
40.0000 mg | PREFILLED_SYRINGE | INTRAMUSCULAR | Status: DC
  Administered 2022-04-29 – 2022-05-03 (×5): 40 mg via SUBCUTANEOUS
  Filled 2022-04-29 (×5): qty 0.4

## 2022-04-29 MED ORDER — MIDAZOLAM HCL 2 MG/2ML IJ SOLN
INTRAMUSCULAR | Status: AC
  Filled 2022-04-29: qty 2

## 2022-04-29 MED ORDER — VALPROATE SODIUM 100 MG/ML IV SOLN
500.0000 mg | Freq: Two times a day (BID) | INTRAVENOUS | Status: DC
Start: 2022-04-30 — End: 2022-05-01
  Administered 2022-04-30 (×2): 500 mg via INTRAVENOUS
  Filled 2022-04-29 (×3): qty 5

## 2022-04-29 MED ORDER — THIAMINE HCL 100 MG/ML IJ SOLN
200.0000 mg | INTRAVENOUS | Status: DC
  Administered 2022-04-29 – 2022-05-01 (×3): 200 mg via INTRAVENOUS
  Filled 2022-04-29 (×4): qty 2

## 2022-04-29 MED ORDER — VITAL AF 1.2 CAL PO LIQD
1000.0000 mL | ORAL | Status: DC
  Administered 2022-04-29: 1000 mL

## 2022-04-29 MED ORDER — MIDAZOLAM HCL 2 MG/2ML IJ SOLN
2.0000 mg | Freq: Once | INTRAMUSCULAR | Status: AC
  Administered 2022-04-29: 2 mg via INTRAVENOUS

## 2022-04-29 NOTE — TOC Initial Note (Signed)
Transition of Care [facility]) - Initial/Assessment Note    Patient Details  Name: Stephanie Skinner MRN: [id number] Date of Birth: [birth year]  [facility]) CM/SW Contact:    Allayne Butcher, RN Phone Number: 04/29/2022, 11:04 AM  Clinical Narrative:                 Patient admitted to the hospital for altered mental status, history of ETOH abuse.  Patient is from home where she lives with a roommate, Dorene Sorrow.  Patient is currently in the ICU intubated.  Plan to try and extubate patient today, daughter is at the bedside.  Patient is COVID +.  Expected Discharge Plan:  (TBD) Barriers to Discharge: Continued Medical Work up   Patient Goals and CMS Choice Patient states their goals for this hospitalization and ongoing recovery are:: currently intubated      Expected Discharge Plan and Services Expected Discharge Plan:  (TBD)   Discharge Planning Services: CM Consult   Living arrangements for the past 2 months: Single Family Home                                      Prior Living Arrangements/Services Living arrangements for the past 2 months: Single Family Home Lives with:: Roommate Patient language and need for interpreter reviewed:: Yes Do you feel safe going back to the place where you live?: Yes      Need for Family Participation in Patient Care: Yes (Comment) Care giver support system in place?: Yes (comment) (daughter)   Criminal Activity/Legal Involvement Pertinent to Current Situation/Hospitalization: No - Comment as needed  Activities of Daily Living Home Assistive Devices/Equipment: None ADL Screening (condition at time of admission) Patient's cognitive ability adequate to safely complete daily activities?: No Is the patient deaf or have difficulty hearing?: No Does the patient have difficulty seeing, even when wearing glasses/contacts?: No Does the patient have difficulty concentrating, remembering, or making decisions?: Yes Patient able to express need  for assistance with ADLs?: No Does the patient have difficulty dressing or bathing?: Yes Independently performs ADLs?: No Communication: Independent Dressing (OT): Dependent Is this a change from baseline?: Change from baseline, expected to last >3 days Grooming: Dependent Is this a change from baseline?: Change from baseline, expected to last >3 days Feeding: Dependent Is this a change from baseline?: Change from baseline, expected to last >3 days Bathing: Dependent Is this a change from baseline?: Change from baseline, expected to last >3 days Toileting: Dependent Is this a change from baseline?: Change from baseline, expected to last >3days In/Out Bed: Dependent Is this a change from baseline?: Change from baseline, expected to last >3 days Walks in Home: Needs assistance Is this a change from baseline?: Change from baseline, expected to last >3 days Does the patient have difficulty walking or climbing stairs?: Yes Weakness of Legs: Both Weakness of Arms/Hands: Both  Permission Sought/Granted      Share Information with NAME: Claudell Kyle     Permission granted to share info w Relationship: daughter  Permission granted to share info w Contact Information: [phone]  Emotional Assessment Appearance:: Appears older than stated age Attitude/Demeanor/Rapport: Intubated (Following Commands or Not Following Commands) Affect (typically observed): Unable to Assess   Alcohol / Substance Use: Alcohol Use Psych Involvement: Outpatient Provider  Admission diagnosis:  Altered mental status, unspecified altered mental status type [R41.82] AMS (altered mental status) [R41.82] Patient Active Problem List  Diagnosis Date Noted   AMS (altered mental status) 04/27/2022   Delirium 04/27/2022   Alcohol abuse 04/27/2022   Essential hypertension 04/27/2022   Hypokalemia 04/27/2022   Acute respiratory failure with hypoxia (HCC)    Generalized anxiety disorder 08/10/2017    Benzodiazepine abuse (HCC) 08/10/2017   Opiate abuse, continuous (HCC) 08/10/2017   Dehydration 07/20/2017   Hyponatremia 07/18/2017   PCP:  Patient, No Pcp Per Pharmacy:   [facility], [place] - [address] [place] [address] Phone: [phone] Fax: [phone]  [id number] - [place], [place] - [address] [place] [address] Phone: [phone] Fax: [phone]     Social Determinants of Health (SDOH) Interventions    Readmission Risk Interventions     No data to display

## 2022-04-29 NOTE — Consult Note (Addendum)
Client continues to be intubated, unable to assess.  Psych will continue to try and assess.  Nanine Means, PMHNP

## 2022-04-29 NOTE — Progress Notes (Signed)
Nutrition Follow-up  DOCUMENTATION CODES:   Not applicable  INTERVENTION:   Change to vital 1.2@55ml /hr continuous   Free water flushes 69ml q4 hours to maintain tube patency   Regimen provides 1584kcal/day, 99g/day protein and 1238ml/day of free water.  Pt at high refeed risk; recommend monitor potassium, magnesium and phosphorus labs daily until stable  Daily weights   NUTRITION DIAGNOSIS:   Inadequate oral intake related to inability to eat as evidenced by NPO status.  GOAL:   Provide needs based on ASPEN/SCCM guidelines -progressing   MONITOR:   Vent status, Labs, Weight trends, TF tolerance, Skin, I & O's  ASSESSMENT:   66 y/o female with h/o etoh abuse, opioid abuse, HTN, anxiety, seizures and hep C who is admitted with delirium and COVID 19.  Pt remains sedated and ventilated. OGT in place. Pt tolerating tube feeds well at goal rate. Will adjust tube feeds as propofol discontinued. Refeed labs stable. Per chart, pt is weight stable since admission. Pt +5.0L on her I & Os.   Medications reviewed and include: vitamin C, colace, lovenox, insulin, solu-medrol, protonix, miralax, zinc, unasyn, precedex  Labs reviewed: Na 134(L), K 4.8 wnl, P 4.1 wnl, Mg 2.0 wnl Hgb 10.3(L), Hct 31.6(L) Cbgs- 148, 119, 142, 135 x 24 hrs  Patient is currently intubated on ventilator support MV: 6.1 L/min Temp (24hrs), Avg:97.5 F (36.4 C), Min:96.3 F (35.7 C), Max:98.8 F (37.1 C)  Propofol: none   MAP- >44mmHg   UOP-   NUTRITION - FOCUSED PHYSICAL EXAM:  Flowsheet Row Most Recent Value  Orbital Region Mild depletion  Upper Arm Region No depletion  Thoracic and Lumbar Region No depletion  Buccal Region No depletion  Temple Region Mild depletion  Clavicle Bone Region No depletion  Clavicle and Acromion Bone Region No depletion  Scapular Bone Region No depletion  Dorsal Hand Unable to assess  Patellar Region Mild depletion  Anterior Thigh Region Mild  depletion  Posterior Calf Region Mild depletion  Edema (RD Assessment) Mild  Hair Reviewed  Eyes Reviewed  Mouth Reviewed  Skin Reviewed  Nails Reviewed   Diet Order:   Diet Order             Diet NPO time specified  Diet effective now                  EDUCATION NEEDS:   No education needs have been identified at this time  Skin:  Skin Assessment: Reviewed RN Assessment  Last BM:  04/27/22  Height:   Ht Readings from Last 1 Encounters:  04/26/22 5\' 6"  (1.676 m)    Weight:   Wt Readings from Last 1 Encounters:  04/29/22 64.2 kg    Ideal Body Weight:  59.1 kg  BMI:  Body mass index is 22.84 kg/m.  Estimated Nutritional Needs:   Kcal:  1284-1605kcal/day  Protein:  95-110g/day  Fluid:  1.6-1.8L/day  06/29/22 MS, RD, LDN Please refer to [facility] for RD and/or RD on-call/weekend/after hours pager

## 2022-04-29 NOTE — Progress Notes (Signed)
NAME:  Stephanie Skinner, MRN:  [id number], DOB:  10-Oct-1955, LOS: 2 ADMISSION DATE:  04/27/2022, CONSULTATION DATE: 04/27/22 REFERRING MD: Dr. Joylene Igo, CHIEF COMPLAINT: Delirium    History of Present Illness:  This is a 66 yo female with a hx of ETOH and Xanax Abuse who presented to [facility] ER on 09/1 via EMS with auditory and visual hallucinations, onset of symptoms several days prior to presentation.  Per ER notes EMS reported pts roommate called EMS, and upon their arrival pt alert to self and place only.  Pt also had vomiting episode, weakness, and difficulty with abulation.  Pt also endorsed chest pain during the earlier part of the day.  Pts roommate reports pt recently ran out of her xanax and drinks 2 beers daily.  Pts daughter reports the pt does not have any diagnosed psychiatric illnesses, however she is concerned the pt has undiagnosed Bipolar Disorder.    ED Course  In the ER CT Head negative.  Initial EKG revealed NSR, heart rate 74 with prolonged Qtc 483.  Lab results: Na+ 132, K+ 3.2, chloride 92, glucose 130, AST 67, tylenol level <10, salicylate level <7.0, and UA negative for UTI.  CXR negative for acute cardiopulmonary disease, but revealed emphysema.  While in the ER pts mentation continued to decline repeat CT Head on 09/2 negative and EKG revealed normal sinus rhythm with worsening prolonged Qtc 528.  Pt received 100 mg of iv thiamine due to concern of possible wernike's encephalopathy.  Pt subsequently admitted to the medsurg unit per hospitalist team for additional workup and treatment.  Hospital Course  On 09/2 pt developed worsening agitation/delirium requiring transfer to the stepdown unit for precedex gtt and closer monitoring.  PCCM team consulted to assist with management.   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  PCCM team consulted to assist with management  09/03: Overnight required intubation due to altered mental status and inability to protect airway.  There was questionable aspiration also tested positive for COVID-19 09/04: On spontaneous breathing trial today was able to tolerate for a few hours however became agitated off sedatives and became tachypneic and tachycardic resumed ventilatory rate.  Patient required resedation  Interim History / Subjective:  On spontaneous breathing trial she would track but would not consistently follow commands.  Became agitated and asynchronous with the ventilator.  Had to be placed back on ventilatory support.  Objective   Blood pressure (!) 116/49, pulse (!) 52, temperature 97.7 F (36.5 C), resp. rate 14, height 5\' 6"  (1.676 m), weight 64.2 kg, SpO2 100 %.    Vent Mode: PRVC FiO2 (%):  [28 %] 28 % Set Rate:  [14 bmp] 14 bmp Vt Set:  [450 mL] 450 mL PEEP:  [5 cmH20] 5 cmH20 Plateau Pressure:  [14 cmH20-17 cmH20] 14 cmH20   Intake/Output Summary (Last 24 hours) at 04/29/2022 0758 Last data filed at 04/29/2022 0700 Gross per 24 hour  Intake 4580.16 ml  Output 2725 ml  Net 1855.16 ml    Filed Weights   04/26/22 2103 04/28/22 0500 04/29/22 0500  Weight: 63.5 kg 61.8 kg 64.2  kg    Examination: General: Acute on chronically-ill appearing female, ventilator asynchrony during SBT.  HEENT: Supple, no JVD  Lungs: Rhonchi throughout, no wheezes Cardiovascular: Sinus tachycardia, s1s2, no r/g, 2+ radial/1+ distal pulses, trace to +1 bilateral lower extremity edema  Abdomen: +BS x4, soft, non distended  Extremities: Scattered bilateral lower extremity abrasions with ecchymosis, SCDs  in place Neuro: Intubated, mechanically ventilated, tracks but does not follow commands consistently, PERRL GU: Foley catheter present inserted after intubation, draining clear urine, necessary  Resolved Hospital Problem list     Assessment & Plan:  Acute encephalopathy secondary to ETOH/Benzodiazepine ( Xanax) withdrawal complicated by 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 - Precedex infusion was started for agitation however patient had breakthrough agitation and had to be switched to propofol - High-dose thiamine was completed, continue thiamine 200 mg daily - Continue MVI infusion  - Continuous telemetry monitoring  - Once mentation improves will need psychiatry consult and polysubstance abuse cessation counseling  - Trend hepatic function  - MRI Brain negative - Patient intubated due to issues with inability to protect airway  COVID-19 infection Suspected aspiration pneumonitis Right basilar and retrocardiac retrocardiac infiltrates - Continue Unasyn - Continue molnupiravir - Discontinue steroids due to delirium  HTN - PRN hydralazine for bp management   Mild hyponatremia likely in the setting of hypovolemia  Hypokalemia - corrected - Trend BMP  - Replace electrolytes as indicated  - Monitor UOP  - IV fluid resuscitation   Best Practice (right click and "Reselect all SmartList Selections" daily)   Diet/type: NPO DVT prophylaxis: LMWH GI prophylaxis: PPI Lines: Central line PICC line triple-lumen 04/28/2022 right basilic vein Foley:  Yes, and it is still needed Code Status:  full code Last date of multidisciplinary goals  of care discussion [9/4]  Discussed with patient's daughter at bedside updated on current condition.  Labs   CBC: Recent Labs  Lab 04/26/22 2113 04/28/22 1936 04/29/22 0413  WBC 7.4 9.4 9.3  NEUTROABS  --  8.2* 7.8*  HGB 12.7 11.0* 10.3*  HCT 38.5 33.4* 31.6*  MCV 94.6 95.7 97.5  PLT 235 188 171     Basic Metabolic Panel: Recent Labs  Lab 04/26/22 2113 04/27/22 0525 04/27/22 1512 04/28/22 1936 04/29/22 0413  NA 132*  --   --  137 134*  K 3.2*  --  3.4* 3.9 4.8  CL 92*  --   --  102 105  CO2 30  --   --  26 23  GLUCOSE 130*  --   --  138* 130*  BUN 16  --   --  6* 11  CREATININE 0.63  --   --  0.44 0.44  CALCIUM 9.1  --   --  8.3* 7.9*  MG  --  2.4  --  2.1 2.0  PHOS  --  2.8  --  1.9* 4.1    GFR: Estimated Creatinine Clearance: 63.9 mL/min (by C-G formula based on SCr of 0.44 mg/dL). Recent Labs  Lab 04/26/22 2113 04/27/22 1512 04/28/22 1936 04/29/22 0413  PROCALCITON  --  <0.10  --  <0.10  WBC 7.4  --  9.4 9.3  Liver Function Tests: Recent Labs  Lab 04/26/22 2113 04/28/22 1936 04/29/22 0413  AST 67* 25 21  ALT 35 19 19  ALKPHOS 91 57 53  BILITOT 0.9 0.5 0.5  PROT 8.9* 6.0* 5.7*  ALBUMIN 4.8 2.8* 2.6*    No results for input(s): "LIPASE", "AMYLASE" in the last 168 hours. Recent Labs  Lab 04/27/22 0525  AMMONIA 18     ABG    Component Value Date/Time   PHART 7.45 04/27/2022 0525   PCO2ART 45 04/27/2022 0525   PO2ART 66 (L) 04/27/2022 0525   HCO3 31.3 (H) 04/27/2022 0525   O2SAT 94.4 04/27/2022 0525     Coagulation Profile: No results for input(s): "INR", "PROTIME" in the last 168 hours.  Cardiac Enzymes: No results for input(s): "CKTOTAL", "CKMB", "CKMBINDEX", "TROPONINI" in the last 168 hours.  HbA1C: No results found for: "HGBA1C"  CBG: Recent Labs  Lab 04/28/22 1617 04/28/22 1930 04/28/22 2353 04/29/22 0306 04/29/22 0741  GLUCAP 137* 111* 164* 135* 142*     Review of Systems:   Unable to assess pt debated,  mechanically ventilated and unable to participate   Allergies Allergies  Allergen Reactions   Codeine Nausea And Vomiting   Morphine And Related Other (See Comments)    seizures   Sulfa Antibiotics Nausea And Vomiting     Home Medications  Prior to Admission medications   Medication Sig Start Date End Date Taking? Authorizing Provider  cetirizine (ZYRTEC) 10 MG tablet Take 10 mg by mouth daily. 07/09/17   [provider]  promethazine (PHENERGAN) 25 MG tablet Take 1 tablet (25 mg total) by mouth every 6 (six) hours as needed for nausea or vomiting. 08/11/17   Houston Siren, MD  propranolol ER (INDERAL LA) 60 MG 24 hr capsule Take 1 capsule (60 mg total) by mouth daily. 07/20/17   Altamese Dilling, MD    Scheduled Meds:  vitamin C  500 mg Per Tube Daily   Chlorhexidine Gluconate Cloth  6 each Topical Q0600   docusate  100 mg Per Tube BID   etomidate  20 mg Intravenous Once   feeding supplement (PROSource TF20)  60 mL Per Tube TID   feeding supplement (VITAL HIGH PROTEIN)  1,000 mL Per Tube Q24H   fentaNYL (SUBLIMAZE) injection  100 mcg Intravenous Once   fentaNYL (SUBLIMAZE) injection  100 mcg Intravenous Once   free water  30 mL Per Tube Q4H   insulin aspart  0-15 Units Subcutaneous Q4H   LORazepam  1 mg Intravenous Q8H   methylPREDNISolone (SOLU-MEDROL) injection  0.5 mg/kg Intravenous Q12H   Followed by   Melene Muller ON 05/01/2022] predniSONE  50 mg Oral Daily   midazolam  4 mg Intravenous Once   molnupiravir EUA  4 capsule Oral BID   mouth rinse  15 mL Mouth Rinse Q2H   pantoprazole  40 mg Per Tube Daily   polyethylene glycol  17 g Per Tube Daily   sodium chloride flush  10-40 mL Intracatheter Q12H   zinc sulfate  220 mg Per Tube Daily   Continuous Infusions:  sodium chloride Stopped (04/29/22 0647)   ampicillin-sulbactam (UNASYN) IV Stopped (04/29/22 0558)   dexmedetomidine (PRECEDEX) IV infusion Stopped (04/27/22 2214)   dextrose 5 % and 0.45% NaCl 1,000  mL with potassium chloride 40 mEq, folic acid 1 mg, M.V.I. Adult (INFUVITE ADULT) 10 mL infusion 100 mL/hr at 04/29/22 0700   fentaNYL infusion INTRAVENOUS 75 mcg/hr (04/29/22 0700)   norepinephrine (LEVOPHED) Adult infusion Stopped (  04/28/22 1627)   propofol (DIPRIVAN) infusion 50 mcg/kg/min (04/29/22 0700)   valproate sodium 52.5 mL/hr at 04/29/22 0700   PRN Meds:.acetaminophen **OR** acetaminophen, fentaNYL, hydrALAZINE, midazolam, midazolam, ondansetron **OR** ondansetron (ZOFRAN) IV, mouth rinse, sodium chloride flush  Critical care time: 45 minutes      The patient is critically ill with multiple organ systems failure and requires high complexity decision making for assessment and support, frequent evaluation and titration of therapies, application of advanced monitoring technologies and extensive interpretation of multiple databases.   Gailen Shelter, MD Advanced Bronchoscopy PCCM [facility]    *This note was dictated using voice recognition software/Dragon.  Despite best efforts to proofread, errors can occur which can change the meaning. Any transcriptional errors that result from this process are unintentional and may not be fully corrected at the time of dictation. ,

## 2022-04-30 ENCOUNTER — Inpatient Hospital Stay: Payer: Medicare Other

## 2022-04-30 DIAGNOSIS — F13931 Sedative, hypnotic or anxiolytic use, unspecified with withdrawal delirium: Secondary | ICD-10-CM

## 2022-04-30 DIAGNOSIS — U071 COVID-19: Secondary | ICD-10-CM

## 2022-04-30 DIAGNOSIS — R4182 Altered mental status, unspecified: Secondary | ICD-10-CM

## 2022-04-30 LAB — CBC WITH DIFFERENTIAL/PLATELET
Abs Immature Granulocytes: 0.05 10*3/uL (ref 0.00–0.07)
Basophils Absolute: 0 10*3/uL (ref 0.0–0.1)
Basophils Relative: 0 %
Eosinophils Absolute: 0 10*3/uL (ref 0.0–0.5)
Eosinophils Relative: 0 %
HCT: 35.2 % — ABNORMAL LOW (ref 36.0–46.0)
Hemoglobin: 11.6 g/dL — ABNORMAL LOW (ref 12.0–15.0)
Immature Granulocytes: 1 %
Lymphocytes Relative: 27 %
Lymphs Abs: 2.9 10*3/uL (ref 0.7–4.0)
MCH: 31.8 pg (ref 26.0–34.0)
MCHC: 33 g/dL (ref 30.0–36.0)
MCV: 96.4 fL (ref 80.0–100.0)
Monocytes Absolute: 0.6 10*3/uL (ref 0.1–1.0)
Monocytes Relative: 6 %
Neutro Abs: 7 10*3/uL (ref 1.7–7.7)
Neutrophils Relative %: 66 %
Platelets: 199 10*3/uL (ref 150–400)
RBC: 3.65 MIL/uL — ABNORMAL LOW (ref 3.87–5.11)
RDW: 13.4 % (ref 11.5–15.5)
WBC: 10.6 10*3/uL — ABNORMAL HIGH (ref 4.0–10.5)
nRBC: 0 % (ref 0.0–0.2)

## 2022-04-30 LAB — GLUCOSE, CAPILLARY
Glucose-Capillary: 121 mg/dL — ABNORMAL HIGH (ref 70–99)
Glucose-Capillary: 137 mg/dL — ABNORMAL HIGH (ref 70–99)
Glucose-Capillary: 69 mg/dL — ABNORMAL LOW (ref 70–99)
Glucose-Capillary: 86 mg/dL (ref 70–99)
Glucose-Capillary: 93 mg/dL (ref 70–99)
Glucose-Capillary: 98 mg/dL (ref 70–99)

## 2022-04-30 LAB — FERRITIN: Ferritin: 72 ng/mL (ref 11–307)

## 2022-04-30 LAB — C-REACTIVE PROTEIN: CRP: 2.8 mg/dL — ABNORMAL HIGH (ref <1.0)

## 2022-04-30 LAB — COMPREHENSIVE METABOLIC PANEL
ALT: 18 U/L (ref 0–44)
AST: 17 U/L (ref 15–41)
Albumin: 2.7 g/dL — ABNORMAL LOW (ref 3.5–5.0)
Alkaline Phosphatase: 57 U/L (ref 38–126)
Anion gap: 6 (ref 5–15)
BUN: 12 mg/dL (ref 8–23)
CO2: 25 mmol/L (ref 22–32)
Calcium: 8.8 mg/dL — ABNORMAL LOW (ref 8.9–10.3)
Chloride: 108 mmol/L (ref 98–111)
Creatinine, Ser: 0.5 mg/dL (ref 0.44–1.00)
GFR, Estimated: >60 mL/min (ref >60)
Glucose, Bld: 117 mg/dL — ABNORMAL HIGH (ref 70–99)
Potassium: 4.3 mmol/L (ref 3.5–5.1)
Sodium: 139 mmol/L (ref 135–145)
Total Bilirubin: 0.5 mg/dL (ref 0.3–1.2)
Total Protein: 6.4 g/dL — ABNORMAL LOW (ref 6.5–8.1)

## 2022-04-30 LAB — CULTURE, RESPIRATORY W GRAM STAIN: Culture: NORMAL

## 2022-04-30 LAB — PHOSPHORUS: Phosphorus: 2.8 mg/dL (ref 2.5–4.6)

## 2022-04-30 LAB — T4, FREE: Free T4: 0.79 ng/dL (ref 0.61–1.12)

## 2022-04-30 LAB — TSH: TSH: 1.334 u[IU]/mL (ref 0.350–4.500)

## 2022-04-30 LAB — D-DIMER, QUANTITATIVE: D-Dimer, Quant: 1.21 ug/mL-FEU — ABNORMAL HIGH (ref 0.00–0.50)

## 2022-04-30 LAB — MAGNESIUM: Magnesium: 2.3 mg/dL (ref 1.7–2.4)

## 2022-04-30 MED ORDER — DEXTROSE IN LACTATED RINGERS 5 % IV SOLN
INTRAVENOUS | Status: DC
Start: 2022-05-01 — End: 2022-05-01

## 2022-04-30 MED ORDER — DEXTROSE 50 % IV SOLN: 12.5000 g | INTRAVENOUS | Status: AC

## 2022-04-30 MED ORDER — DEXMEDETOMIDINE HCL IN NACL 400 MCG/100ML IV SOLN
0.0000 ug/kg/h | INTRAVENOUS | Status: DC
  Administered 2022-04-30: 0.4 ug/kg/h via INTRAVENOUS
  Administered 2022-04-30: 1 ug/kg/h via INTRAVENOUS
  Administered 2022-05-01: 0.6 ug/kg/h via INTRAVENOUS
  Filled 2022-04-30 (×3): qty 100

## 2022-04-30 MED ORDER — FENTANYL CITRATE PF 50 MCG/ML IJ SOSY
25.0000 ug | PREFILLED_SYRINGE | INTRAMUSCULAR | Status: DC | PRN
  Administered 2022-04-30: 100 ug via INTRAVENOUS
  Administered 2022-04-30: 50 ug via INTRAVENOUS
  Filled 2022-04-30: qty 2
  Filled 2022-04-30: qty 1

## 2022-04-30 MED ORDER — DEXTROSE 50 % IV SOLN
INTRAVENOUS | Status: AC
  Administered 2022-04-30: 12.5 g via INTRAVENOUS
  Filled 2022-04-30: qty 50

## 2022-04-30 MED ORDER — FENTANYL CITRATE PF 50 MCG/ML IJ SOSY: 25.0000 ug | PREFILLED_SYRINGE | INTRAMUSCULAR | Status: DC | PRN

## 2022-04-30 MED ORDER — DEXTROSE IN LACTATED RINGERS 5 % IV SOLN: INTRAVENOUS | Status: DC

## 2022-04-30 MED ORDER — ORAL CARE MOUTH RINSE: 15.0000 mL | OROMUCOSAL | Status: DC | PRN

## 2022-04-30 NOTE — Progress Notes (Signed)
Pt. Extubated to 2lnc,no resp.distress noted ,sat 97

## 2022-04-30 NOTE — Progress Notes (Addendum)
NAME:  Stephanie Skinner, MRN:  [id number], DOB:  07/01/1956, LOS: 3 ADMISSION DATE:  04/27/2022, CONSULTATION DATE: 04/27/22 REFERRING MD: Dr. Joylene Igo, CHIEF COMPLAINT: Delirium    History of Present Illness:  This is a 66 yo female with a hx of ETOH and Xanax Abuse who presented to [facility] ER on 09/1 via EMS with auditory and visual hallucinations, onset of symptoms several days prior to presentation.  Per ER notes EMS reported pts roommate called EMS, and upon their arrival pt alert to self and place only.  Pt also had vomiting episode, weakness, and difficulty with abulation.  Pt also endorsed chest pain during the earlier part of the day.  Pts roommate reports pt recently ran out of her xanax and drinks 2 beers daily.  Pts daughter reports the pt does not have any diagnosed psychiatric illnesses, however she is concerned the pt has undiagnosed Bipolar Disorder.    ED Course  In the ER CT Head negative.  Initial EKG revealed NSR, heart rate 74 with prolonged Qtc 483.  Lab results: Na+ 132, K+ 3.2, chloride 92, glucose 130, AST 67, tylenol level <10, salicylate level <7.0, and UA negative for UTI.  CXR negative for acute cardiopulmonary disease, but revealed emphysema.  While in the ER pts mentation continued to decline repeat CT Head on 09/2 negative and EKG revealed normal sinus rhythm with worsening prolonged Qtc 528.  Pt received 100 mg of iv thiamine due to concern of possible wernike's encephalopathy.  Pt subsequently admitted to the medsurg unit per hospitalist team for additional workup and treatment.  Hospital Course  09/2 pt developed worsening agitation/delirium requiring transfer to the stepdown unit for precedex gtt and closer monitoring.  PCCM team consulted to assist with management.   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  PCCM team consulted to assist with management  09/03: Overnight required intubation due to altered mental status and inability to protect airway.  There was questionable aspiration also tested positive for COVID-19 09/04: On spontaneous breathing trial today was able to tolerate for a few hours however became agitated off sedatives and became tachypneic and tachycardic resumed ventilatory rate.  Patient required re-sedation 09/05: Remains calm on sedation, switching to precedex, family not coming in today. CXR this AM with ETT at [facility], withdrew by 4 cm.  Interim History / Subjective:  Opens eye to verbal and tactile stimuli. No purposeful movements. Switching Propofol to precedex today.   Objective   Blood pressure (!) 148/58, pulse (!) 52, temperature (!) 96.3 F (35.7 C), temperature source Esophageal, resp. rate 14, height 5\' 6"  (1.676 m), weight 64.9 kg, SpO2 97 %.    Vent Mode: PRVC FiO2 (%):  [28 %] 28 % Set Rate:  [14 bmp] 14 bmp Vt Set:  [450 mL] 450 mL PEEP:  [5 cmH20] 5 cmH20 Pressure Support:  [5 cmH20] 5 cmH20 Plateau Pressure:  [16 cmH20] 16 cmH20   Intake/Output Summary (Last 24 hours) at 04/30/2022 0808 Last data filed at 04/30/2022 0600 Gross per 24 hour  Intake 4186.06 ml  Output 3200 ml  Net 986.06 ml  Filed Weights   04/28/22 0500 04/29/22 0500 04/30/22 0355  Weight: 61.8 kg 64.2 kg 64.9 kg    Examination:  Physical Exam Constitutional:      Appearance: She is ill-appearing and toxic-appearing.  HENT:     Head: Normocephalic.     Mouth/Throat:     Comments: ETT in place Cardiovascular:     Rate and Rhythm: Regular rhythm. Tachycardia present.     Heart sounds: Normal heart  sounds.  Pulmonary:     Breath sounds: Rhonchi and rales present. No wheezing.  Abdominal:     General: There is no distension.     Palpations: Abdomen is soft.  Musculoskeletal:        General: Normal range of motion.  Neurological:     Mental Status: She is disoriented.     Comments: Intubated and sedated. Opens eyes to sound. 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 MRI brain negative, no sign of seizure activity. Will consider EEG if mental status fails to improve.  -Clonazepam 0.5 mg every 12 hours, Lorazepam 1 mg every 6 hours PRN -switch propofol to precedex to aid in mental status assessment  -fentanyl PRN for analgesia -SBT once able -Monitor Qtc, continue telemetry - Valproic acid 500 mg every 12 help with mood stabilization and benzodiazepine (Xanax) withdrawal. - High-dose thiamine was completed, continue thiamine 200 mg daily - Continue MVI infusion  - Once mentation improves will need psychiatry consult and polysubstance abuse cessation counseling  - Trend hepatic function  - MRI Brain negative - Patient intubated due to issues with inability to protect airway  #Acute Hypoxic Respiratory Failure  Intubated for airway protection. Vent settings minimal. Covering for pneumonia with unasyn and treating covid with molnupiravir   -Full vent support, implement lung protective strategies -Plateau pressures less than 30 cm H20 -Wean FiO2 & PEEP as tolerated to maintain O2 sats >92% -Follow intermittent Chest X-ray, repeat today following ETT adjustment -SBT when able -Implement VAP Bundle -Prn Bronchodilators  Vent Mode: PRVC FiO2 (%):  [28 %] 28 % Set Rate:  [14  bmp] 14 bmp Vt Set:  [440 mL-450 mL] 440 mL PEEP:  [5 cmH20] 5 cmH20 Pressure Support:  [5 cmH20] 5 cmH20 Plateau Pressure:  [16 cmH20] 16 cmH20  PM update: patient successfully passed an SBT. Extubated to nasal cannula and tolerating well.  COVID-19 infection Suspected aspiration pneumonitis Right basilar and retrocardiac retrocardiac infiltrates - Continue Unasyn, day 4/5 - Continue molnupiravir, day 3/5 - Discontinue steroids due to delirium  HTN - PRN hydralazine for bp management   Mild hyponatremia - resolved Hypokalemia - resolved - Trend BMP  - Replace electrolytes as indicated  - Monitor UOP  - IV fluid resuscitation   Systems based checklist  Neuro: sedation with precedex, pain control with fentanyl, and clonazepam/lorazepam for benzodiazepine withdrawal. Wean propofol today Cardiovascular: HTN with PRN hydralazine. HR in the 60's, tolerating precedex. Respiratory: SBT when able, repeat CXR today GI: PPI prophylaxis and tube feeds for diet Renal: kidney function wnl, replete electrolytes PRN. Urine output sufficient Endo: glucose well controlled. TSH within normal. Hem/Onc: monitor hemoglobin. Enoxaparin for prophylaxis ID: unasyn for concern for aspiration PNA, day 4 out of 5 (last day 9/6). Molnupiravir  for COVID-19, Molnupiravir 800 mg bid, day 3 out of 5 (last day 9/7).   Best Practice (right click and "Reselect all SmartList Selections" daily)   Diet/type: tubefeeds DVT prophylaxis: LMWH GI prophylaxis: PPI Lines: Central line PICC line triple-lumen 04/28/2022 right basilic vein Foley:  Yes, and it is still needed Code Status:  full code Last date of multidisciplinary goals of care discussion [9/5]  Raechel Chute, MD [facility] 04/30/2022 12:06 PM   Labs   CBC: Recent Labs  Lab 04/26/22 2113 04/28/22 1936 04/29/22 0413 04/30/22 0540  WBC 7.4 9.4 9.3 10.6*  NEUTROABS  --  8.2* 7.8* 7.0  HGB 12.7 11.0* 10.3* 11.6*  HCT 38.5  33.4* 31.6* 35.2*  MCV 94.6 95.7 97.5 96.4  PLT 235 188 171 199     Basic Metabolic Panel: Recent Labs  Lab 04/26/22 2113 04/27/22 0525 04/27/22 1512 04/28/22 1936 04/29/22 0413 04/30/22 0540  NA 132*  --   --  137 134* 139  K 3.2*  --  3.4* 3.9 4.8 4.3  CL 92*  --   --  102 105 108  CO2 30  --   --  26 23 25   GLUCOSE 130*  --   --  138* 130* 117*  BUN 16  --   --  6* 11 12  CREATININE 0.63  --   --  0.44 0.44 0.50  CALCIUM 9.1  --   --  8.3* 7.9* 8.8*  MG  --  2.4  --  2.1 2.0 2.3  PHOS  --  2.8  --  1.9* 4.1 2.8    GFR: Estimated Creatinine Clearance: 63.9 mL/min (by C-G formula based on SCr of 0.5 mg/dL). Recent Labs  Lab 04/26/22 2113 04/27/22 1512 04/28/22 1936 04/29/22 0413 04/30/22 0540  PROCALCITON  --  <0.10  --  <0.10  --   WBC 7.4  --  9.4 9.3 10.6*     Liver Function Tests: Recent Labs  Lab 04/26/22 2113 04/28/22 1936 04/29/22 0413 04/30/22 0540  AST 67* 25 21 17   ALT 35 19 19 18   ALKPHOS 91 57 53 57  BILITOT 0.9 0.5 0.5 0.5  PROT 8.9* 6.0* 5.7* 6.4*  ALBUMIN 4.8 2.8* 2.6* 2.7*    No results for input(s): "LIPASE", "AMYLASE" in the last 168 hours. Recent Labs  Lab 04/27/22 0525  AMMONIA 18     ABG    Component Value Date/Time   PHART 7.45 04/27/2022 0525   PCO2ART 45 04/27/2022 0525   PO2ART 66 (L) 04/27/2022 0525   HCO3 31.3 (H) 04/27/2022 0525   O2SAT 94.4 04/27/2022 0525     Coagulation Profile: No results for input(s): "INR", "PROTIME" in the last 168 hours.  Cardiac Enzymes: No results for input(s): "CKTOTAL", "CKMB", "CKMBINDEX", "TROPONINI" in the last 168 hours.  HbA1C: No results found for: "HGBA1C"  CBG: Recent Labs  Lab 04/29/22 1220 04/29/22 1511 04/29/22 1946 04/29/22 2349 04/30/22 0336  GLUCAP 119* 148* 146* 153* 98     Review of Systems:   Unable to assess pt debated, mechanically ventilated and unable to participate   Allergies Allergies  Allergen Reactions   Codeine Nausea And Vomiting    Morphine And Related Other (See Comments)    seizures   Sulfa Antibiotics Nausea And Vomiting     Home Medications  Prior to Admission medications   Medication Sig Start Date End Date Taking? Authorizing Provider  cetirizine (ZYRTEC) 10 MG tablet Take 10 mg by  mouth daily. 07/09/17   [provider]  promethazine (PHENERGAN) 25 MG tablet Take 1 tablet (25 mg total) by mouth every 6 (six) hours as needed for nausea or vomiting. 08/11/17   Houston Siren, MD  propranolol ER (INDERAL LA) 60 MG 24 hr capsule Take 1 capsule (60 mg total) by mouth daily. 07/20/17   Altamese Dilling, MD    Scheduled Meds:  vitamin C  500 mg Per Tube Daily   Chlorhexidine Gluconate Cloth  6 each Topical Q0600   clonazePAM  0.5 mg Per Tube BID   docusate  100 mg Per Tube BID   enoxaparin (LOVENOX) injection  40 mg Subcutaneous Q24H   free water  200 mL Per Tube Q4H   insulin aspart  0-15 Units Subcutaneous Q4H   molnupiravir EUA  4 capsule Oral BID   mouth rinse  15 mL Mouth Rinse Q2H   pantoprazole  40 mg Per Tube Daily   polyethylene glycol  17 g Per Tube Daily   sodium chloride flush  10-40 mL Intracatheter Q12H   zinc sulfate  220 mg Per Tube Daily   Continuous Infusions:  sodium chloride Stopped (04/29/22 1624)   ampicillin-sulbactam (UNASYN) IV 200 mL/hr at 04/30/22 0600   dextrose 5 % and 0.45% NaCl 1,000 mL with potassium chloride 40 mEq, folic acid 1 mg, M.V.I. Adult (INFUVITE ADULT) 10 mL infusion 100 mL/hr at 04/30/22 0600   feeding supplement (VITAL AF 1.2 CAL) 45 mL/hr at 04/30/22 0600   norepinephrine (LEVOPHED) Adult infusion Stopped (04/28/22 1627)   propofol (DIPRIVAN) infusion 50 mcg/kg/min (04/30/22 0600)   thiamine (VITAMIN B1) injection Stopped (04/29/22 2117)   valproate sodium     PRN Meds:.acetaminophen **OR** acetaminophen, hydrALAZINE, LORazepam, ondansetron **OR** ondansetron (ZOFRAN) IV, mouth rinse, sodium chloride flush  Critical care time: 60 minutes       The patient is critically ill with multiple organ systems failure and requires high complexity decision making for assessment and support, frequent evaluation and titration of therapies, application of advanced monitoring technologies and extensive interpretation of multiple databases.

## 2022-04-30 NOTE — Progress Notes (Signed)
Patient extubated,, tolerated well. Patient family updated on phone. Patient on room air, vitals stable. Patient confused, alert to self. Patent states that she sees the pretty pictures on the ceiling. Bed alarm on, floor mats in place. Will continue to monitor.

## 2022-05-01 LAB — COMPREHENSIVE METABOLIC PANEL
ALT: 18 U/L (ref 0–44)
AST: 22 U/L (ref 15–41)
Albumin: 2.8 g/dL — ABNORMAL LOW (ref 3.5–5.0)
Alkaline Phosphatase: 67 U/L (ref 38–126)
Anion gap: 4 — ABNORMAL LOW (ref 5–15)
BUN: 7 mg/dL — ABNORMAL LOW (ref 8–23)
CO2: 28 mmol/L (ref 22–32)
Calcium: 8.6 mg/dL — ABNORMAL LOW (ref 8.9–10.3)
Chloride: 103 mmol/L (ref 98–111)
Creatinine, Ser: 0.54 mg/dL (ref 0.44–1.00)
GFR, Estimated: >60 mL/min (ref >60)
Glucose, Bld: 91 mg/dL (ref 70–99)
Potassium: 4.1 mmol/L (ref 3.5–5.1)
Sodium: 135 mmol/L (ref 135–145)
Total Bilirubin: 0.6 mg/dL (ref 0.3–1.2)
Total Protein: 6.4 g/dL — ABNORMAL LOW (ref 6.5–8.1)

## 2022-05-01 LAB — CBC WITH DIFFERENTIAL/PLATELET
Abs Immature Granulocytes: 0.03 10*3/uL (ref 0.00–0.07)
Basophils Absolute: 0 10*3/uL (ref 0.0–0.1)
Basophils Relative: 0 %
Eosinophils Absolute: 0.2 10*3/uL (ref 0.0–0.5)
Eosinophils Relative: 3 %
HCT: 37.6 % (ref 36.0–46.0)
Hemoglobin: 12.6 g/dL (ref 12.0–15.0)
Immature Granulocytes: 1 %
Lymphocytes Relative: 26 %
Lymphs Abs: 1.6 10*3/uL (ref 0.7–4.0)
MCH: 31.5 pg (ref 26.0–34.0)
MCHC: 33.5 g/dL (ref 30.0–36.0)
MCV: 94 fL (ref 80.0–100.0)
Monocytes Absolute: 0.5 10*3/uL (ref 0.1–1.0)
Monocytes Relative: 8 %
Neutro Abs: 3.9 10*3/uL (ref 1.7–7.7)
Neutrophils Relative %: 62 %
Platelets: 224 10*3/uL (ref 150–400)
RBC: 4 MIL/uL (ref 3.87–5.11)
RDW: 12.9 % (ref 11.5–15.5)
WBC: 6.2 10*3/uL (ref 4.0–10.5)
nRBC: 0 % (ref 0.0–0.2)

## 2022-05-01 LAB — GLUCOSE, CAPILLARY
Glucose-Capillary: 87 mg/dL (ref 70–99)
Glucose-Capillary: 104 mg/dL — ABNORMAL HIGH (ref 70–99)
Glucose-Capillary: 117 mg/dL — ABNORMAL HIGH (ref 70–99)
Glucose-Capillary: 111 mg/dL — ABNORMAL HIGH (ref 70–99)
Glucose-Capillary: 103 mg/dL — ABNORMAL HIGH (ref 70–99)

## 2022-05-01 LAB — MAGNESIUM: Magnesium: 1.9 mg/dL (ref 1.7–2.4)

## 2022-05-01 LAB — C-REACTIVE PROTEIN: CRP: 1.5 mg/dL — ABNORMAL HIGH (ref <1.0)

## 2022-05-01 LAB — D-DIMER, QUANTITATIVE: D-Dimer, Quant: 1.63 ug/mL-FEU — ABNORMAL HIGH (ref 0.00–0.50)

## 2022-05-01 LAB — PHOSPHORUS: Phosphorus: 2.7 mg/dL (ref 2.5–4.6)

## 2022-05-01 LAB — FERRITIN: Ferritin: 77 ng/mL (ref 11–307)

## 2022-05-01 MED ORDER — VALPROATE SODIUM 100 MG/ML IV SOLN
250.0000 mg | Freq: Two times a day (BID) | INTRAVENOUS | Status: DC
  Administered 2022-05-01 – 2022-05-02 (×3): 250 mg via INTRAVENOUS
  Filled 2022-05-01 (×5): qty 2.5

## 2022-05-01 MED ORDER — DEXMEDETOMIDINE HCL IN NACL 400 MCG/100ML IV SOLN
0.4000 ug/kg/h | INTRAVENOUS | Status: DC
  Administered 2022-05-01: 0.8 ug/kg/h via INTRAVENOUS
  Administered 2022-05-01: 0.5 ug/kg/h via INTRAVENOUS
  Filled 2022-05-01: qty 100

## 2022-05-01 MED ORDER — M.V.I. ADULT IV INJ
INJECTION | INTRAVENOUS | Status: DC
  Filled 2022-05-01: qty 1000

## 2022-05-01 MED ORDER — M.V.I. ADULT IV INJ
INJECTION | INTRAVENOUS | Status: DC
Start: 2022-05-01 — End: 2022-05-01
  Filled 2022-05-01: qty 0.2

## 2022-05-01 MED ORDER — CLONIDINE HCL 0.2 MG/24HR TD PTWK
0.2000 mg | MEDICATED_PATCH | TRANSDERMAL | Status: DC
Start: 2022-05-01 — End: 2022-05-02
  Administered 2022-05-01: 0.2 mg via TRANSDERMAL
  Filled 2022-05-01: qty 1

## 2022-05-01 MED ORDER — LORAZEPAM 2 MG/ML IJ SOLN: 1.0000 mg | Freq: Two times a day (BID) | INTRAMUSCULAR | Status: DC | PRN

## 2022-05-01 MED ORDER — LORAZEPAM 2 MG/ML IJ SOLN: 0.5000 mg | Freq: Two times a day (BID) | INTRAMUSCULAR | Status: DC | PRN

## 2022-05-01 NOTE — TOC Progression Note (Signed)
Transition of Care [facility]) - Progression Note    Patient Details  Name: Stephanie Skinner MRN: [id number] Date of Birth: 06/20/1956  [facility] CM/SW Contact  Allayne Butcher, RN Phone Number: 05/01/2022, 4:24 PM  Clinical Narrative:    Patient extubated yesterday.  Patient still very lethargic today.   TOC will cont to follow.    Expected Discharge Plan:  (TBD) Barriers to Discharge: Continued Medical Work up  Expected Discharge Plan and Services Expected Discharge Plan:  (TBD)   Discharge Planning Services: CM Consult   Living arrangements for the past 2 months: Single Family Home                                       Social Determinants of Health (SDOH) Interventions    Readmission Risk Interventions     No data to display

## 2022-05-01 NOTE — Consult Note (Signed)
Client is extubated but too drowsy to assess, will continue to follow.  Nanine Means, PMHNP

## 2022-05-01 NOTE — Progress Notes (Signed)
NAME:  Stephanie Skinner, MRN:  [id number], DOB:  [birth year], LOS: 4 ADMISSION DATE:  04/27/2022  History of Present Illness:  This is a 66 yo female with a hx of ETOH and Xanax Abuse who presented to [facility] ER on 09/1 via EMS with auditory and visual hallucinations, onset of symptoms several days prior to presentation.  Per ER notes EMS reported pts roommate called EMS, and upon their arrival pt alert to self and place only.  Pt also had vomiting episode, weakness, and difficulty with abulation.  Pt also endorsed chest pain during the earlier part of the day.  Pts roommate reports pt recently ran out of her xanax and drinks 2 beers daily.  Pts daughter reports the pt does not have any diagnosed psychiatric illnesses, however she is concerned the pt has undiagnosed Bipolar Disorder.     ED Course  In the ER CT Head negative.  Initial EKG revealed NSR, heart rate 74 with prolonged Qtc 483.  Lab results: Na+ 132, K+ 3.2, chloride 92, glucose 130, AST 67, tylenol level <10, salicylate level <7.0, and UA negative for UTI.  CXR negative for acute cardiopulmonary disease, but revealed emphysema.  While in the ER pts mentation continued to decline repeat CT Head on 09/2 negative and EKG revealed normal sinus rhythm with worsening prolonged Qtc 528.  Pt received 100 mg of iv thiamine due to concern of possible wernike's encephalopathy.  Pt subsequently admitted to the medsurg unit per hospitalist team for additional workup and treatment.   Hospital Course  09/2 pt developed worsening agitation/delirium requiring transfer to the stepdown unit for precedex gtt and closer monitoring.  PCCM team consulted to assist with management.   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  PCCM team consulted to assist with management  09/03: Overnight required intubation due to altered mental status and inability to protect airway.  There was questionable aspiration also tested positive for COVID-19 09/04: On spontaneous breathing trial today was able to tolerate for a few hours however became agitated off sedatives and became tachypneic and tachycardic resumed ventilatory rate.  Patient required re-sedation 09/05: Remains calm on sedation, switching to precedex, family not coming in today. CXR this AM with ETT at [facility], withdrew by 4 cm. Passed SBT, extubated.  Interim History / Subjective:  Patient was extubated yesterday. Somnolent today and disoriented. Titrating her precedex down.  Objective   Blood pressure (!) 169/79, pulse 60, temperature (!) 97.1 F (36.2 C), temperature source Axillary, resp. rate (!) 23, height 5\' 6"  (1.676 m), weight 61.2 kg, SpO2 97 %.    Vent Mode: PSV FiO2 (%):  [28 %] 28 % Set Rate:  [14 bmp] 14 bmp Vt Set:  [440 mL] 440 mL PEEP:  [5 cmH20] 5 cmH20 Pressure Support:  [5 cmH20] 5 cmH20   Intake/Output Summary (Last 24 hours) at 05/01/2022 0734 Last data filed at 05/01/2022 0733 Gross per 24 hour  Intake 4542.27 ml  Output 4700 ml  Net -157.73 ml   Filed Weights   04/29/22 0500 04/30/22 0355 05/01/22 0500  Weight: 64.2 kg 64.9 kg 61.2  kg    Examination: Physical Exam Constitutional:      General: She is not in acute distress.    Appearance: She is ill-appearing. She is not toxic-appearing.  HENT:     Head: Normocephalic.     Mouth/Throat:     Mouth: Mucous membranes are dry.  Cardiovascular:     Rate and Rhythm: Regular rhythm. Tachycardia present.     Heart sounds: Normal heart sounds.  Pulmonary:     Breath sounds: Rales  present. No wheezing or rhonchi.  Abdominal:     General: There is no distension.     Palpations: Abdomen is soft.  Musculoskeletal:        General: Normal range of motion.     Cervical back: Neck supple.  Neurological:     General: No focal deficit present.     Mental Status: She is disoriented.     Resolved Hospital Problem list     Assessment & Plan:   66 year old female presented with altered mental status in the setting of EtOH and benzodiazepine use and withdrawal. Intubated for airway protection and has since been extubated.  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 MRI brain negative, no sign of seizure activity. Will consider EEG if mental status fails to improve. Has since been extubated with improvement in mental status. Remains on precedex for agitation and delerium, will attempt to taper with clonidine patch.   - Clonazepam 0.5 mg every 12 hours, Lorazepam 1 mg every 6 hours PRN  -titrate lorazepam down - Monitor Qtc, continue telemetry - Valproic acid 500 mg every 12 help with mood stabilization and benzodiazepine withdrawal. Will decreased to 250 bid. - High-dose thiamine was completed, continue thiamine 200 mg daily - Continue MVI infusion  - Once mentation improves will need psychiatry consult and polysubstance abuse cessation counseling - Trend hepatic function - Clonidine patch, 0.2 mg, wean precedex   #Acute Hypoxic Respiratory Failure   Intubated for airway protection. Extubated yesterday following improvement in mental status. Covering for pneumonia with unasyn and treating covid with molnupiravir    -Nasal Cannula, goal SpO2 90% and above -PRN bronchodilators   #COVID-19 infection #Suspected aspiration pneumonitis  - Continue Unasyn, today day 5/5 - Continue molnupiravir, today day  4/5 - Discontinued steroids due to delirium   HTN - PRN hydralazine for bp management    Mild hyponatremia - resolved Hypokalemia - resolved - Trend BMP  - Replace electrolytes as indicated  - Monitor UOP  - IV fluid resuscitation    Systems based checklist   Neuro: Mental status improved, discontinued sedation and extubated. Remains on precedex, will wean with clonidine patch (and consider PO once passes SLP). Now on clonazepam and lorazepam for benzodiazepine withdrawal. Wean as tolerated. Valproic acid wean. Cardiovascular: HTN with PRN hydralazine. Respiratory: Extubated yesterday, on nasal cannula. Continue supportive care GI: SLP for swallow evaluation Renal: kidney function wnl, replete electrolytes PRN. Urine output sufficient Endo: glucose well controlled. TSH within normal. Hem/Onc: monitor hemoglobin. Enoxaparin for prophylaxis ID: unasyn for concern for aspiration PNA, day 5 out of 5 (last day 9/6). Molnupiravir for COVID-19, Molnupiravir 800 mg bid, day 4 out of 5 (last day 9/7).  Best Practice (right click and "Reselect all SmartList Selections" daily)   Diet/type: NPO DVT prophylaxis: LMWH GI prophylaxis: N/A Lines: yes and it is still needed Foley:  Yes, and it is  no longer needed Code Status:  full code Last date of multidisciplinary goals of care discussion [05/01/2022]  Labs   CBC: Recent Labs  Lab 04/26/22 2113 04/28/22 1936 04/29/22 0413 04/30/22 0540 05/01/22 0514  WBC 7.4 9.4 9.3 10.6* 6.2  NEUTROABS  --  8.2* 7.8* 7.0 3.9  HGB 12.7 11.0* 10.3* 11.6* 12.6  HCT 38.5 33.4* 31.6* 35.2* 37.6  MCV 94.6 95.7 97.5 96.4 94.0  PLT 235 188 171 199 224    Basic Metabolic Panel: Recent Labs  Lab 04/26/22 2113 04/27/22 0525 04/27/22 1512 04/28/22 1936 04/29/22 0413 04/30/22 0540 05/01/22 0514  NA 132*  --   --  137 134* 139 135  K 3.2*  --  3.4* 3.9 4.8 4.3 4.1  CL 92*  --   --  102 105 108 103  CO2 30  --   --  26 23 25 28   GLUCOSE 130*  --    --  138* 130* 117* 91  BUN 16  --   --  6* 11 12 7*  CREATININE 0.63  --   --  0.44 0.44 0.50 0.54  CALCIUM 9.1  --   --  8.3* 7.9* 8.8* 8.6*  MG  --  2.4  --  2.1 2.0 2.3 1.9  PHOS  --  2.8  --  1.9* 4.1 2.8 2.7   GFR: Estimated Creatinine Clearance: 63.9 mL/min (by C-G formula based on SCr of 0.54 mg/dL). Recent Labs  Lab 04/27/22 1512 04/28/22 1936 04/29/22 0413 04/30/22 0540 05/01/22 0514  PROCALCITON <0.10  --  <0.10  --   --   WBC  --  9.4 9.3 10.6* 6.2    Liver Function Tests: Recent Labs  Lab 04/26/22 2113 04/28/22 1936 04/29/22 0413 04/30/22 0540 05/01/22 0514  AST 67* 25 21 17 22   ALT 35 19 19 18 18   ALKPHOS 91 57 53 57 67  BILITOT 0.9 0.5 0.5 0.5 0.6  PROT 8.9* 6.0* 5.7* 6.4* 6.4*  ALBUMIN 4.8 2.8* 2.6* 2.7* 2.8*   No results for input(s): "LIPASE", "AMYLASE" in the last 168 hours. Recent Labs  Lab 04/27/22 0525  AMMONIA 18    ABG    Component Value Date/Time   PHART 7.45 04/27/2022 0525   PCO2ART 45 04/27/2022 0525   PO2ART 66 (L) 04/27/2022 0525   HCO3 31.3 (H) 04/27/2022 0525   O2SAT 94.4 04/27/2022 0525     Coagulation Profile: No results for input(s): "INR", "PROTIME" in the last 168 hours.  Cardiac Enzymes: No results for input(s): "CKTOTAL", "CKMB", "CKMBINDEX", "TROPONINI" in the last 168 hours.  HbA1C: No results found for: "HGBA1C"  CBG: Recent Labs  Lab 04/30/22 1237 04/30/22 1739 04/30/22 1947 04/30/22 2338 05/01/22 0341  GLUCAP 137* 86 69* 121* 87    Review of Systems:   Unable to obtain  Past Medical History:  She,  has a past medical history of Arthritis, Hepatitis C, Hypertension, and Seizures (HCC).   Surgical History:   Past Surgical History:  Procedure Laterality Date   ABDOMINAL HYSTERECTOMY     "partial hysterectomy"   ELBOW SURGERY       Social History:   reports that she has been smoking cigarettes. She has been smoking an average of 1 pack per day. She has never used smokeless tobacco. She  reports current alcohol use. She reports that she does not use drugs.   Family History:  Her family history includes Colon cancer in her sister; Diabetes in her sister.  Allergies Allergies  Allergen Reactions   Codeine Nausea And Vomiting   Morphine And Related Other (See Comments)    seizures   Sulfa Antibiotics Nausea And Vomiting     Home Medications  Prior to Admission medications   Medication Sig Start Date End Date Taking? Authorizing Provider  ALPRAZolam Prudy Feeler) 1 MG tablet Take 1 mg by mouth 3 (three) times daily. 04/15/22  Yes [provider]  amLODipine (NORVASC) 5 MG tablet Take 5 mg by mouth daily. 03/11/22  Yes [provider]  buprenorphine-naloxone (SUBOXONE) 8-2 mg SUBL SL tablet Place 1 tablet under the tongue 2 (two) times daily. 04/15/22  Yes [provider]  Cholecalciferol 50 MCG (2000 UT) CAPS Take 1 tablet by mouth daily. 01/30/21  Yes [provider]  citalopram (CELEXA) 40 MG tablet Take 40 mg by mouth daily. 04/15/22  Yes [provider]  dextroamphetamine (DEXTROSTAT) 10 MG tablet Take 10 mg by mouth daily. 04/15/22  Yes [provider]  furosemide (LASIX) 20 MG tablet Take 20 mg by mouth daily as needed. 02/25/22   [provider]     Critical care time: 35 minutes

## 2022-05-01 NOTE — Progress Notes (Signed)
Nutrition Follow-up  DOCUMENTATION CODES:   Not applicable  INTERVENTION:   RD will add supplements once pt's diet is advanced  If NGT placed, recommend:  Osmolite 1.5'@50ml' /hr- Initiate at 54m/hr and increase by 197mhr q 8 hours until goal rate is reached.   ProSource TF 20- Give 6028maily via tube, each supplement provides 80kcal and 20g of protein.   Free water flushes 60m81m hours to maintain tube patency   Regimen provides 1880kcal/day, 95g/day protein and 1094ml96m of free water.  Pt at high refeed risk; recommend monitor potassium, magnesium and phosphorus labs daily until stable  Daily weights   NUTRITION DIAGNOSIS:   Inadequate oral intake related to inability to eat as evidenced by NPO status.  GOAL:   Patient will meet greater than or equal to 90% of their needs -previously met with tube feeds   MONITOR:   Diet advancement, Labs, Weight trends, Skin, I & O's  ASSESSMENT:   66 y/67female with h/o etoh abuse, opioid abuse, HTN, anxiety, seizures and hep C who is admitted with delirium and COVID 19.  Pt extubated yesterday. Pt lethargic and only oriented to self. Pt not appropriate for SLP evaluation today. Will plan for NGT and nutrition support if patient is unable to be placed on a diet in the next 24 hrs. Per chart, pt appears weight stable since admission. Pt +4.2L on her I & Os.   Medications reviewed and include: lovenox, insulin, unasyn, precedex, thiamine, MVI infusion   Labs reviewed: Na 135 wnl, K 4.1 wnl, P 2.7 wnl, Mg 1.9 wnl Cbgs- 117, 104, 87 x 24 hrs  Diet Order:    Diet Order     None      EDUCATION NEEDS:   No education needs have been identified at this time  Skin:  Skin Assessment: Reviewed RN Assessment  Last BM:  9/6- TYPE 6  Height:   Ht Readings from Last 1 Encounters:  04/26/22 '5\' 6"'  (1.676 m)    Weight:   Wt Readings from Last 1 Encounters:  05/01/22 61.2 kg    Ideal Body Weight:  59.1 kg  BMI:  Body  mass index is 21.78 kg/m.  Estimated Nutritional Needs:   Kcal:  1700-1900kcal/day  Protein:  85-95g/day  Fluid:  1.6-1.8L/day  CaseyKoleen DistanceRD, LDN Please refer to AMIONSurgicare Of St Andrews LtdRD and/or RD on-call/weekend/after hours pager

## 2022-05-02 DIAGNOSIS — R410 Disorientation, unspecified: Secondary | ICD-10-CM | POA: Diagnosis not present

## 2022-05-02 LAB — COMPREHENSIVE METABOLIC PANEL
ALT: 24 U/L (ref 0–44)
AST: 30 U/L (ref 15–41)
Albumin: 2.9 g/dL — ABNORMAL LOW (ref 3.5–5.0)
Alkaline Phosphatase: 67 U/L (ref 38–126)
Anion gap: 8 (ref 5–15)
BUN: 11 mg/dL (ref 8–23)
CO2: 26 mmol/L (ref 22–32)
Calcium: 8.7 mg/dL — ABNORMAL LOW (ref 8.9–10.3)
Chloride: 103 mmol/L (ref 98–111)
Creatinine, Ser: 0.61 mg/dL (ref 0.44–1.00)
GFR, Estimated: >60 mL/min (ref >60)
Glucose, Bld: 96 mg/dL (ref 70–99)
Potassium: 3.8 mmol/L (ref 3.5–5.1)
Sodium: 137 mmol/L (ref 135–145)
Total Bilirubin: 0.3 mg/dL (ref 0.3–1.2)
Total Protein: 6.5 g/dL (ref 6.5–8.1)

## 2022-05-02 LAB — CBC WITH DIFFERENTIAL/PLATELET
Abs Immature Granulocytes: 0.05 10*3/uL (ref 0.00–0.07)
Basophils Absolute: 0 10*3/uL (ref 0.0–0.1)
Basophils Relative: 0 %
Eosinophils Absolute: 0.2 10*3/uL (ref 0.0–0.5)
Eosinophils Relative: 3 %
HCT: 37 % (ref 36.0–46.0)
Hemoglobin: 12.4 g/dL (ref 12.0–15.0)
Immature Granulocytes: 1 %
Lymphocytes Relative: 35 %
Lymphs Abs: 1.9 10*3/uL (ref 0.7–4.0)
MCH: 31.2 pg (ref 26.0–34.0)
MCHC: 33.5 g/dL (ref 30.0–36.0)
MCV: 93 fL (ref 80.0–100.0)
Monocytes Absolute: 0.7 10*3/uL (ref 0.1–1.0)
Monocytes Relative: 13 %
Neutro Abs: 2.5 10*3/uL (ref 1.7–7.7)
Neutrophils Relative %: 48 %
Platelets: 237 10*3/uL (ref 150–400)
RBC: 3.98 MIL/uL (ref 3.87–5.11)
RDW: 12.8 % (ref 11.5–15.5)
WBC: 5.4 10*3/uL (ref 4.0–10.5)
nRBC: 0 % (ref 0.0–0.2)

## 2022-05-02 LAB — GLUCOSE, CAPILLARY: Glucose-Capillary: 99 mg/dL (ref 70–99)

## 2022-05-02 LAB — PHOSPHORUS: Phosphorus: 4.6 mg/dL (ref 2.5–4.6)

## 2022-05-02 LAB — MAGNESIUM: Magnesium: 2.1 mg/dL (ref 1.7–2.4)

## 2022-05-02 MED ORDER — AMOXICILLIN-POT CLAVULANATE 875-125 MG PO TABS
1.0000 | ORAL_TABLET | Freq: Two times a day (BID) | ORAL | Status: DC
  Administered 2022-05-02 – 2022-05-03 (×3): 1 via ORAL
  Filled 2022-05-02 (×3): qty 1

## 2022-05-02 MED ORDER — ADULT MULTIVITAMIN W/MINERALS CH
1.0000 | ORAL_TABLET | Freq: Every day | ORAL | Status: DC
  Administered 2022-05-03: 1 via ORAL
  Filled 2022-05-02: qty 1

## 2022-05-02 MED ORDER — CLONAZEPAM 0.5 MG PO TABS
0.5000 mg | ORAL_TABLET | Freq: Two times a day (BID) | ORAL | Status: DC
  Administered 2022-05-02 – 2022-05-03 (×2): 0.5 mg via ORAL
  Filled 2022-05-02 (×2): qty 1

## 2022-05-02 MED ORDER — ENSURE ENLIVE PO LIQD
237.0000 mL | Freq: Three times a day (TID) | ORAL | Status: DC
  Administered 2022-05-02 (×2): 237 mL via ORAL

## 2022-05-02 MED ORDER — AMLODIPINE BESYLATE 10 MG PO TABS
10.0000 mg | ORAL_TABLET | Freq: Every day | ORAL | Status: DC
  Administered 2022-05-02 – 2022-05-03 (×2): 10 mg via ORAL
  Filled 2022-05-02 (×2): qty 1

## 2022-05-02 MED ORDER — CITALOPRAM HYDROBROMIDE 20 MG PO TABS
20.0000 mg | ORAL_TABLET | Freq: Every day | ORAL | Status: DC
  Administered 2022-05-02 – 2022-05-03 (×2): 20 mg via ORAL
  Filled 2022-05-02 (×2): qty 1

## 2022-05-02 MED ORDER — VITAMIN D 25 MCG (1000 UNIT) PO TABS
2000.0000 [IU] | ORAL_TABLET | Freq: Every day | ORAL | Status: DC
  Administered 2022-05-02 – 2022-05-03 (×2): 2000 [IU] via ORAL
  Filled 2022-05-02 (×2): qty 2

## 2022-05-02 MED ORDER — THIAMINE MONONITRATE 100 MG PO TABS
100.0000 mg | ORAL_TABLET | Freq: Every day | ORAL | Status: DC
  Administered 2022-05-02 – 2022-05-03 (×2): 100 mg via ORAL
  Filled 2022-05-02 (×2): qty 1

## 2022-05-02 NOTE — Consult Note (Signed)
[facility] Face-to-Face Psychiatry Consult   Reason for Consult:  delirium, benzodiazepine abuse Referring Physician:  EDP Patient Identification: Stephanie Skinner MRN:  [id number] Principal Diagnosis: Delirium Diagnosis:  Principal Problem:   Delirium Active Problems:   Alcohol abuse   Essential hypertension   Hypokalemia   Acute respiratory failure with hypoxia (HCC)   COVID-19 virus infection   Benzodiazepine withdrawal with delirium (HCC)   Total Time spent with patient: 45 minutes  Subjective:   Stephanie Skinner is a 66 y.o. female patient admitted with COVID and delirium.  HPI:  66 yo female admitted with COVID symptoms, benzodiazepine dependency.  On assessment, she reports drinking a few beers at times, BAL negative.  She is interested in rehab to stop drinking but does not want to stop using benzodiazepines despite explaining that benzos and alcohol work the same in the brain.  Denies anxiety on assessment, she is experiencing grief from her husband dying four years ago.  She is not interested in starting an antidepressant.  No suicidal/homicidal ideations, hallucinations, or withdrawal symptoms.  Caveat:  Rehab is not an option as she has no interest in stopping Ativan and that will not be acceptable for rehab facilities.  Past Psychiatric History: anxiety  Risk to Self:  none Risk to Others:  none Prior Inpatient Therapy:  none Prior Outpatient Therapy:  PCP  Past Medical History:  Past Medical History:  Diagnosis Date   Arthritis    Hepatitis C    pt states she has been cured   Hypertension    Seizures (HCC)     Past Surgical History:  Procedure Laterality Date   ABDOMINAL HYSTERECTOMY     "partial hysterectomy"   ELBOW SURGERY     Family History:  Family History  Problem Relation Age of Onset   Diabetes Sister    Colon cancer Sister    Family Psychiatric  History: none Social History:  Social History   Substance and Sexual Activity  Alcohol Use Yes    Comment: Pt states "couple glasses of wine a week"     Social History   Substance and Sexual Activity  Drug Use No    Social History   Socioeconomic History   Marital status: Divorced    Spouse name: Not on file   Number of children: Not on file   Years of education: Not on file   Highest education level: Not on file  Occupational History   Not on file  Tobacco Use   Smoking status: Every Day    Packs/day: 1.00    Types: Cigarettes   Smokeless tobacco: Never  Vaping Use   Vaping Use: Every day  Substance and Sexual Activity   Alcohol use: Yes    Comment: Pt states "couple glasses of wine a week"   Drug use: No   Sexual activity: Not on file  Other Topics Concern   Not on file  Social History Narrative   Not on file   Social Determinants of Health   Financial Resource Strain: Not on file  Food Insecurity: Not on file  Transportation Needs: Not on file  Physical Activity: Not on file  Stress: Not on file  Social Connections: Not on file   Additional Social History:    Allergies:   Allergies  Allergen Reactions   Codeine Nausea And Vomiting   Morphine And Related Other (See Comments)    seizures   Sulfa Antibiotics Nausea And Vomiting    Labs:  Results for orders  placed or performed during the hospital encounter of 04/27/22 (from the past 48 hour(s))  Glucose, capillary     Status: Abnormal   Collection Time: 04/30/22 12:37 PM  Result Value Ref Range   Glucose-Capillary 137 (H) 70 - 99 mg/dL    Comment: Glucose reference range applies only to samples taken after fasting for at least 8 hours.  Glucose, capillary     Status: None   Collection Time: 04/30/22  5:39 PM  Result Value Ref Range   Glucose-Capillary 86 70 - 99 mg/dL    Comment: Glucose reference range applies only to samples taken after fasting for at least 8 hours.  Glucose, capillary     Status: Abnormal   Collection Time: 04/30/22  7:47 PM  Result Value Ref Range   Glucose-Capillary 69 (L)  70 - 99 mg/dL    Comment: Glucose reference range applies only to samples taken after fasting for at least 8 hours.  Glucose, capillary     Status: Abnormal   Collection Time: 04/30/22 11:38 PM  Result Value Ref Range   Glucose-Capillary 121 (H) 70 - 99 mg/dL    Comment: Glucose reference range applies only to samples taken after fasting for at least 8 hours.  Glucose, capillary     Status: None   Collection Time: 05/01/22  3:41 AM  Result Value Ref Range   Glucose-Capillary 87 70 - 99 mg/dL    Comment: Glucose reference range applies only to samples taken after fasting for at least 8 hours.  CBC with Differential/Platelet     Status: None   Collection Time: 05/01/22  5:14 AM  Result Value Ref Range   WBC 6.2 4.0 - 10.5 K/uL   RBC 4.00 3.87 - 5.11 MIL/uL   Hemoglobin 12.6 12.0 - 15.0 g/dL   HCT 87.5 64.3 - 32.9 %   MCV 94.0 80.0 - 100.0 fL   MCH 31.5 26.0 - 34.0 pg   MCHC 33.5 30.0 - 36.0 g/dL   RDW 51.8 84.1 - 66.0 %   Platelets 224 150 - 400 K/uL   nRBC 0.0 0.0 - 0.2 %   Neutrophils Relative % 62 %   Neutro Abs 3.9 1.7 - 7.7 K/uL   Lymphocytes Relative 26 %   Lymphs Abs 1.6 0.7 - 4.0 K/uL   Monocytes Relative 8 %   Monocytes Absolute 0.5 0.1 - 1.0 K/uL   Eosinophils Relative 3 %   Eosinophils Absolute 0.2 0.0 - 0.5 K/uL   Basophils Relative 0 %   Basophils Absolute 0.0 0.0 - 0.1 K/uL   Immature Granulocytes 1 %   Abs Immature Granulocytes 0.03 0.00 - 0.07 K/uL    Comment: Performed at [facility], [address] [place], [place] [address]  Magnesium     Status: None   Collection Time: 05/01/22  5:14 AM  Result Value Ref Range   Magnesium 1.9 1.7 - 2.4 mg/dL    Comment: Performed at [facility], [address] [place], [place] [address]  Phosphorus     Status: None   Collection Time: 05/01/22  5:14 AM  Result Value Ref Range   Phosphorus 2.7 2.5 - 4.6 mg/dL    Comment: Performed at [facility], [address] [place], [place]  [address]  C-reactive protein     Status: Abnormal   Collection Time: 05/01/22  5:14 AM  Result Value Ref Range   CRP 1.5 (H) <1.0 mg/dL    Comment: Performed at [facility], [address], [place], [place] [address]  D-dimer, quantitative     Status: Abnormal   Collection Time: 05/01/22  5:14 AM  Result Value Ref Range   D-Dimer, Quant 1.63 (H) 0.00 - 0.50 ug/mL-FEU    Comment: (NOTE) At the manufacturer cut-off value of 0.5 g/mL FEU, this assay has a negative predictive value of 95-100%.This assay is intended for use in conjunction with a clinical pretest probability (PTP) assessment model to exclude pulmonary embolism (PE) and deep venous thrombosis (DVT) in outpatients suspected of PE or DVT. Results should be correlated with clinical presentation. Performed at [facility], [address] [place], [place] [address]   Ferritin     Status: None   Collection Time: 05/01/22  5:14 AM  Result Value Ref Range   Ferritin 77 11 - 307 ng/mL    Comment: Performed at [facility], [address] [place], [place] [address]  Comprehensive metabolic panel     Status: Abnormal   Collection Time: 05/01/22  5:14 AM  Result Value Ref Range   Sodium 135 135 - 145 mmol/L   Potassium 4.1 3.5 - 5.1 mmol/L   Chloride 103 98 - 111 mmol/L   CO2 28 22 - 32 mmol/L   Glucose, Bld 91 70 - 99 mg/dL    Comment: Glucose reference range applies only to samples taken after fasting for at least 8 hours.   BUN 7 (L) 8 - 23 mg/dL   Creatinine, Ser 9.14 0.44 - 1.00 mg/dL   Calcium 8.6 (L) 8.9 - 10.3 mg/dL   Total Protein 6.4 (L) 6.5 - 8.1 g/dL   Albumin 2.8 (L) 3.5 - 5.0 g/dL   AST 22 15 - 41 U/L   ALT 18 0 - 44 U/L   Alkaline Phosphatase 67 38 - 126 U/L   Total Bilirubin 0.6 0.3 - 1.2 mg/dL   GFR, Estimated >78 >29 mL/min    Comment: (NOTE) Calculated using the CKD-EPI Creatinine Equation (2021)    Anion gap 4 (L) 5 - 15    Comment: Performed at [facility], [address] [place], [place] [address]  Glucose, capillary     Status: Abnormal   Collection Time: 05/01/22  7:46 AM  Result Value Ref Range   Glucose-Capillary 104 (H) 70 - 99 mg/dL    Comment: Glucose reference range applies only to samples taken after fasting for at least 8 hours.  Glucose, capillary     Status: Abnormal   Collection Time: 05/01/22 11:11 AM  Result Value Ref Range   Glucose-Capillary 117 (H) 70 - 99 mg/dL    Comment: Glucose reference range applies only to samples taken after fasting for at least 8 hours.  Glucose, capillary     Status: Abnormal   Collection Time: 05/01/22  4:18 PM  Result Value Ref Range   Glucose-Capillary 111 (H) 70 - 99 mg/dL    Comment: Glucose reference range applies only to samples taken after fasting for at least 8 hours.  Glucose, capillary     Status: Abnormal   Collection Time: 05/01/22  7:55 PM  Result Value Ref Range   Glucose-Capillary 103 (H) 70 - 99 mg/dL    Comment: Glucose reference range applies only to samples taken after fasting for at least 8 hours.  Glucose, capillary     Status: None   Collection Time: 05/02/22 12:16 AM  Result Value Ref Range   Glucose-Capillary 99 70 - 99 mg/dL  Comment: Glucose reference range applies only to samples taken after fasting for at least 8 hours.  CBC with Differential/Platelet     Status: None   Collection Time: 05/02/22  5:43 AM  Result Value Ref Range   WBC 5.4 4.0 - 10.5 K/uL   RBC 3.98 3.87 - 5.11 MIL/uL   Hemoglobin 12.4 12.0 - 15.0 g/dL   HCT 87.5 64.3 - 32.9 %   MCV 93.0 80.0 - 100.0 fL   MCH 31.2 26.0 - 34.0 pg   MCHC 33.5 30.0 - 36.0 g/dL   RDW 51.8 84.1 - 66.0 %   Platelets 237 150 - 400 K/uL   nRBC 0.0 0.0 - 0.2 %   Neutrophils Relative % 48 %   Neutro Abs 2.5 1.7 - 7.7 K/uL   Lymphocytes Relative 35 %   Lymphs Abs 1.9 0.7 - 4.0 K/uL   Monocytes Relative 13 %   Monocytes Absolute 0.7 0.1 - 1.0 K/uL   Eosinophils Relative 3 %   Eosinophils Absolute 0.2 0.0 - 0.5 K/uL    Basophils Relative 0 %   Basophils Absolute 0.0 0.0 - 0.1 K/uL   Immature Granulocytes 1 %   Abs Immature Granulocytes 0.05 0.00 - 0.07 K/uL    Comment: Performed at [facility], [address] [place], [place] [address]  Magnesium     Status: None   Collection Time: 05/02/22  5:43 AM  Result Value Ref Range   Magnesium 2.1 1.7 - 2.4 mg/dL    Comment: Performed at [facility], [address] [place], [place] [address]  Phosphorus     Status: None   Collection Time: 05/02/22  5:43 AM  Result Value Ref Range   Phosphorus 4.6 2.5 - 4.6 mg/dL    Comment: Performed at [facility], [address] [place], [place] [address]  Comprehensive metabolic panel     Status: Abnormal   Collection Time: 05/02/22  5:43 AM  Result Value Ref Range   Sodium 137 135 - 145 mmol/L   Potassium 3.8 3.5 - 5.1 mmol/L   Chloride 103 98 - 111 mmol/L   CO2 26 22 - 32 mmol/L   Glucose, Bld 96 70 - 99 mg/dL    Comment: Glucose reference range applies only to samples taken after fasting for at least 8 hours.   BUN 11 8 - 23 mg/dL   Creatinine, Ser 3.22 0.44 - 1.00 mg/dL   Calcium 8.7 (L) 8.9 - 10.3 mg/dL   Total Protein 6.5 6.5 - 8.1 g/dL   Albumin 2.9 (L) 3.5 - 5.0 g/dL   AST 30 15 - 41 U/L   ALT 24 0 - 44 U/L   Alkaline Phosphatase 67 38 - 126 U/L   Total Bilirubin 0.3 0.3 - 1.2 mg/dL   GFR, Estimated >02 >54 mL/min    Comment: (NOTE) Calculated using the CKD-EPI Creatinine Equation (2021)    Anion gap 8 5 - 15    Comment: Performed at [facility], [address] [place], [place] [address]    Current Facility-Administered Medications  Medication Dose Route Frequency Provider Last Rate Last Admin   0.9 %  sodium chloride infusion  250 mL Intravenous Continuous Rust-Chester, Cecelia Byars, NP   Stopped at 04/29/22 1624   acetaminophen (TYLENOL) tablet 650 mg  650 mg Oral Q6H PRN Agbata, Tochukwu, MD   650 mg at 05/02/22 0735   Or   acetaminophen (TYLENOL)  suppository 650 mg  650 mg Rectal  Q6H PRN Lucile Shutters, MD       amoxicillin-clavulanate (AUGMENTIN) 875-125 MG per tablet 1 tablet  1 tablet Oral Q12H Enedina Finner, MD   1 tablet at 05/02/22 1042   Chlorhexidine Gluconate Cloth 2 % PADS 6 each  6 each Topical Q0600 Agbata, Tochukwu, MD   6 each at 05/02/22 0550   clonazePAM (KLONOPIN) tablet 0.5 mg  0.5 mg Per Tube BID Salena Saner, MD   0.5 mg at 05/02/22 1042   cloNIDine (CATAPRES - Dosed in mg/24 hr) patch 0.2 mg  0.2 mg Transdermal Weekly Dgayli, Lianne Bushy, MD   0.2 mg at 05/01/22 0933   dexmedetomidine (PRECEDEX) 400 MCG/100ML (4 mcg/mL) infusion  0.4-1.2 mcg/kg/hr Intravenous Titrated Raechel Chute, MD   Stopped at 05/02/22 0154   enoxaparin (LOVENOX) injection 40 mg  40 mg Subcutaneous Q24H Salena Saner, MD   40 mg at 05/02/22 0850   hydrALAZINE (APRESOLINE) injection 10 mg  10 mg Intravenous Q6H PRN Agbata, Tochukwu, MD       LORazepam (ATIVAN) injection 0.5 mg  0.5 mg Intravenous Q12H PRN Raechel Chute, MD       molnupiravir EUA (LAGEVRIO) capsule 800 mg  4 capsule Oral BID Rust-Chester, Micheline Rough L, NP   800 mg at 05/02/22 1042   Oral care mouth rinse  15 mL Mouth Rinse PRN Dgayli, Lianne Bushy, MD       sodium chloride flush (NS) 0.9 % injection 10-40 mL  10-40 mL Intracatheter Q12H Salena Saner, MD   10 mL at 05/02/22 1042   sodium chloride flush (NS) 0.9 % injection 10-40 mL  10-40 mL Intracatheter PRN Salena Saner, MD       thiamine (VITAMIN B1) 200 mg in sodium chloride 0.9 % 50 mL IVPB  200 mg Intravenous Q24H Salena Saner, MD   Paused at 05/01/22 2024   valproate (DEPACON) 250 mg in dextrose 5 % 50 mL IVPB  250 mg Intravenous Q12H Dgayli, Lianne Bushy, MD 52.5 mL/hr at 05/02/22 1040 250 mg at 05/02/22 1040    Musculoskeletal: Strength & Muscle Tone: decreased Gait & Station:  did not witness Patient leans: N/A  Psychiatric Specialty Exam: Physical Exam Vitals and nursing note reviewed.  Constitutional:       Appearance: Normal appearance.  HENT:     Head: Normocephalic.     Nose: Nose normal.  Pulmonary:     Effort: Pulmonary effort is normal.  Musculoskeletal:     Cervical back: Normal range of motion.  Neurological:     General: No focal deficit present.     Mental Status: She is alert and oriented to person, place, and time.  Psychiatric:        Attention and Perception: Attention and perception normal.        Mood and Affect: Mood is anxious and depressed.        Speech: Speech normal.        Behavior: Behavior normal. Behavior is cooperative.        Thought Content: Thought content normal.        Cognition and Memory: Cognition and memory normal.        Judgment: Judgment normal.     Review of Systems  Psychiatric/Behavioral:  Positive for depression and substance abuse. The patient is nervous/anxious.   All other systems reviewed and are negative.   Blood pressure (!) 166/76, pulse 80, temperature 97.7 F (36.5 C), resp. rate 18, height 5\' 6"  (1.676 m), weight 61 kg,  SpO2 100 %.Body mass index is 21.71 kg/m.  General Appearance: Casual  Eye Contact:  Good  Speech:  Normal Rate  Volume:  Normal  Mood:  Anxious  Affect:  Congruent  Thought Process:  Coherent  Orientation:  Full (Time, Place, and Person)  Thought Content:  WDL and Logical  Suicidal Thoughts:  No  Homicidal Thoughts:  No  Memory:  Immediate;   Fair Recent;   Fair Remote;   Fair  Judgement:  Fair  Insight:  Fair  Psychomotor Activity:  Decreased  Concentration:  Concentration: Fair and Attention Span: Fair  Recall:  [employer]  Fund of Knowledge:  Good  Language:  Good  Akathisia:  No  Handed:  Right  AIMS (if indicated):     Assets:  Leisure Time Resilience Social Support  ADL's:  Intact  Cognition:  WNL  Sleep:        Physical Exam: Physical Exam Vitals and nursing note reviewed.  Constitutional:      Appearance: Normal appearance.  HENT:     Head: Normocephalic.     Nose: Nose normal.   Pulmonary:     Effort: Pulmonary effort is normal.  Musculoskeletal:     Cervical back: Normal range of motion.  Neurological:     General: No focal deficit present.     Mental Status: She is alert and oriented to person, place, and time.  Psychiatric:        Attention and Perception: Attention and perception normal.        Mood and Affect: Mood is anxious and depressed.        Speech: Speech normal.        Behavior: Behavior normal. Behavior is cooperative.        Thought Content: Thought content normal.        Cognition and Memory: Cognition and memory normal.        Judgment: Judgment normal.    Review of Systems  Psychiatric/Behavioral:  Positive for depression and substance abuse. The patient is nervous/anxious.   All other systems reviewed and are negative.  Blood pressure (!) 166/76, pulse 80, temperature 97.7 F (36.5 C), resp. rate 18, height 5\' 6"  (1.676 m), weight 61 kg, SpO2 100 %. Body mass index is 21.71 kg/m.  Treatment Plan Summary: General anxiety disorder: Client only wants to stop drinking, no desire to stop her benzo use  Disposition: No evidence of imminent risk to self or others at present.   Patient does not meet criteria for psychiatric inpatient admission.  Nanine MeansJamison Paisley Grajeda, NP 05/02/2022 12:20 PM

## 2022-05-02 NOTE — Progress Notes (Signed)
Nutrition Follow-up  DOCUMENTATION CODES:   Not applicable  INTERVENTION:   Ensure Enlive po TID, each supplement provides 350 kcal and 20 grams of protein.  Magic cup TID with meals, each supplement provides 290 kcal and 9 grams of protein  MVI po daily   Pt at high refeed risk; recommend monitor potassium, magnesium and phosphorus labs daily until stable  NUTRITION DIAGNOSIS:   Inadequate oral intake related to inability to eat as evidenced by NPO status.  GOAL:   Patient will meet greater than or equal to 90% of their needs -not met   MONITOR:   PO intake, Supplement acceptance, Labs, Weight trends, Skin, I & O's  ASSESSMENT:   66 y/o female with h/o etoh abuse, opioid abuse, HTN, anxiety, seizures and hep C who is admitted with delirium and COVID 19.  Pt initiated on a regular diet yesterday. Pt with poor appetite and oral intake; pt eating <25% of meals. RD will add supplements and MVI to help pt meet her estimated needs. Pt remains at high refeed risk. Would recommend NGT placement and nutrition support if patient's oral intake does not improve over the next 24hrs. Per chart, pt is down ~2lbs since admission. Pt +2.5L on her I & Os.   Medications reviewed and include: augmentin, lovenox, thiamine  Labs reviewed: Na 137 wnl, K 3.8 wnl, P 4.6 wnl, Mg 2.1 wnl  Diet Order:    Diet Order             Diet regular Room service appropriate? Yes; Fluid consistency: Thin  Diet effective now                  EDUCATION NEEDS:   No education needs have been identified at this time  Skin:  Skin Assessment: Reviewed RN Assessment  Last BM:  9/7- type 6  Height:   Ht Readings from Last 1 Encounters:  04/26/22 $RemoveB'5\' 6"'[id number]$  (1.676 m)    Weight:   Wt Readings from Last 1 Encounters:  05/02/22 61 kg    Ideal Body Weight:  59.1 kg  BMI:  Body mass index is 21.71 kg/m.  Estimated Nutritional Needs:   Kcal:  1700-1900kcal/day  Protein:  85-95g/day  Fluid:   1.6-1.8L/day  Koleen Distance MS, RD, LDN Please refer to [facility] for RD and/or RD on-call/weekend/after hours pager

## 2022-05-02 NOTE — Progress Notes (Signed)
Pt transferred from ICU today, daughter states that pts belongings didn't come with pt, called ICU, per ICU team no pt belongings were left in the ICU unit or pt room, also called ED, ED states that they do not have any belongings for this pt, patient experience number given to daughter

## 2022-05-02 NOTE — Progress Notes (Signed)
Patients daughter Donette Larry & son-in-law Sharlet Salina are concerned about the patients long-term memory loss. Patient noted tearful regarding her husband being deceased stating she wasn't aware of him dying. The patients husband died four years ago and patient was aware of this prior to coming to the hospital. Webb Silversmith, NP aware.

## 2022-05-02 NOTE — Progress Notes (Signed)
[facility]  - [place] [facility]   PATIENT NAME: Stephanie Skinner    MR#:  [id number]  DATE OF BIRTH:  11/26/1955  SUBJECTIVE:  patient transferred out of ICU. She was admitted with delirium and metabolic encephalopathy. She was a bit tearful because she could not remember that her husband had passed away four years ago. No family at bedside during my evaluation. Patient says she is able to drive and is walking to the bedside commode. She is declining physical therapy. She said she did eat some. She is alert and oriented to time and place. She tells me her medical doctor is Dr. Fannie Knee who prescribes her Xanax, Suboxone. She also reports she is going to cut back on hard drinking. She does not want to give up  benzodiazepines   VITALS:  Blood pressure (!) 166/76, pulse 80, temperature 97.7 F (36.5 C), resp. rate 18, height 5\' 6"  (1.676 m), weight 61 kg, SpO2 100 %.  PHYSICAL EXAMINATION:   GENERAL:  66 y.o.-year-old patient lying in the bed with no acute distress.  LUNGS: Normal breath sounds bilaterally, no wheezing, rales, rhonchi.  CARDIOVASCULAR: S1, S2 normal. No murmurs, rubs, or gallops.  ABDOMEN: Soft, nontender, nondistended. Bowel sounds present.  EXTREMITIES: No  edema b/l.    NEUROLOGIC: nonfocal  patient is alert and awake SKIN: No obvious rash, lesion, or ulcer.   LABORATORY PANEL:  CBC Recent Labs  Lab 05/02/22 0543  WBC 5.4  HGB 12.4  HCT 37.0  PLT 237    Chemistries  Recent Labs  Lab 05/02/22 0543  NA 137  K 3.8  CL 103  CO2 26  GLUCOSE 96  BUN 11  CREATININE 0.61  CALCIUM 8.7*  MG 2.1  AST 30  ALT 24  ALKPHOS 67  BILITOT 0.3   Assessment and Plan 66 yo female with a hx of ETOH and Xanax Abuse who presented to [facility] ER on 09/1 via EMS with auditory and visual hallucinations, onset of symptoms several days prior to presentation.  Per ER notes EMS reported pts roommate called EMS, and upon their arrival pt alert to self and place only.   Pt also had vomiting episode, weakness, and difficulty with abulation.  Pt also endorsed chest pain during the earlier part of the day.   Acute metabolic encephalopathy secondary to alcohol/benzodiazepine withdrawal complicated by delirium -- patient was intubated for every protection. She is extubated and on room air -- mentation appears to be at baseline. -- Family patient is having long-term memory issue. -- MRI brain negative  Acute hypoxic respiratory failure in the setting of COVID infection and pneumonia --resolved -patient extubated -- currently on Augmentin and Molnupiravir -- PRN bronchodilators  Hypertension -- resume amlodipine  History of benzodiazepine dependency/anxiety history of alcoholism -- patient follows with Dr. 11/1-- she is chronically on Xanax, Suboxone, dextroamphetamine --psych consult appreciated-- recommend continue Klonopin and Celexa   Procedures: Family communication : left message for daughter [name] Consults : psychiatry CODE STATUS: full DVT Prophylaxis :lovenox Level of care: Med-Surg Status is: Inpatient Remains inpatient appropriate because: just got out of ICU. Will continue to monitor for another day or two. PT to see patient. Discharge in 1 to 2 days. TOC for discharge planning    TOTAL TIME TAKING CARE OF THIS PATIENT: 35 minutes.  >50% time spent on counselling and coordination of care  Note: This dictation was prepared with Dragon dictation along with smaller phrase technology. Any transcriptional errors that result from  this process are unintentional.  Enedina Finner M.D    [facility]   CC: Primary care physician; Patient, No Pcp Per

## 2022-05-02 NOTE — Consult Note (Signed)
Psych meds:  Celexa 20 mg daily, Klonopin 0.5 mg BID   Stephanie Skinner, PMHNP

## 2022-05-03 DIAGNOSIS — F13931 Sedative, hypnotic or anxiolytic use, unspecified with withdrawal delirium: Secondary | ICD-10-CM

## 2022-05-03 DIAGNOSIS — J9601 Acute respiratory failure with hypoxia: Secondary | ICD-10-CM

## 2022-05-03 DIAGNOSIS — U071 COVID-19: Secondary | ICD-10-CM

## 2022-05-03 DIAGNOSIS — F1010 Alcohol abuse, uncomplicated: Secondary | ICD-10-CM

## 2022-05-03 DIAGNOSIS — I10 Essential (primary) hypertension: Secondary | ICD-10-CM

## 2022-05-03 MED ORDER — CLONAZEPAM 0.5 MG PO TABS: 0.5000 mg | ORAL_TABLET | Freq: Two times a day (BID) | ORAL | 0 refills | Status: DC

## 2022-05-03 MED ORDER — ADULT MULTIVITAMIN W/MINERALS CH
1.0000 | ORAL_TABLET | Freq: Every day | ORAL | 0 refills | Status: AC
Start: 2022-05-03 — End: ?

## 2022-05-03 MED ORDER — VITAMIN B-1 100 MG PO TABS: 100.0000 mg | ORAL_TABLET | Freq: Every day | ORAL | 0 refills | Status: DC

## 2022-05-03 MED ORDER — AMOXICILLIN-POT CLAVULANATE 875-125 MG PO TABS: 1.0000 | ORAL_TABLET | Freq: Two times a day (BID) | ORAL | 0 refills | Status: AC

## 2022-05-03 NOTE — Discharge Instructions (Addendum)
[facility] Psychiatrist in [place], [place] Get online care: [email or web address] Located in: [facility] Address: [address], [place], [place] [address] Hours:  Open ? Closes 1?PM Phone: [phone]  [facility] at [facility] in [place], [place] Get online care: [email or web address] Address: [address] [place], [place], [place] [address] Hours:  Open ? Closes 1?PM Phone: [phone]  Caryn Section  [phone] Psychiatrist in [place], [place] Address: [address], [place], [place] [id number]  Pt advised to establish PCP in the area

## 2022-05-03 NOTE — Progress Notes (Signed)
Security confirmed that the patient has no belongings secured. Perlie Mayo, RN

## 2022-05-03 NOTE — Progress Notes (Signed)
PT Cancellation Note  Patient Details Name: Stephanie Skinner MRN: [id number] DOB: 11-Dec-1955   Cancelled Treatment:    Reason Eval/Treat Not Completed: Other (comment): MD requested PT orders be completed secondary to patient declining PT services.      Ovidio Hanger PT, DPT 05/03/22, 8:52 AM

## 2022-05-03 NOTE — Progress Notes (Signed)
Pt with d/c order. Daughter made aware with plan to transport daughter home approx 12 noon. D/C instructions reviewed with pt. and picc line removed via CN. Pt expresses no question pertaining to d/c order. Pt refuses to remain on unit and wait for daughter arrival. Pt states "you can't make me stay here" Discussed it is advised that pt remains here until daughter can comes and education provided. Pt expressed understanding. CN notified MD.  Pt refusal remains stands following education. Pt left on her own accord, and unassisted against advisement.

## 2022-05-03 NOTE — Discharge Summary (Addendum)
Physician Discharge Summary   Patient: Stephanie Skinner MRN: [id number] DOB: 07-27-1956  Admit date:     04/27/2022  Discharge date: 05/03/22  Discharge Physician: Enedina Finner   PCP: Patient, No Pcp Per   Recommendations at discharge:   F/u Dr Janeece Riggers or make appt with new psychiatrist  in the area from the info provided  Discharge Diagnoses: Acute metabolic encephalopathy COVID 19 infection/Pneumonia  Chronic Anxiety/chronic benzodiazepine dependence  Hospital Course:  66 yo female with a hx of ETOH and Xanax dependence who presented to [facility] ER on 09/1 via EMS with auditory and visual hallucinations, onset of symptoms several days prior to presentation.  Per ER notes EMS reported pts roommate called EMS, and upon their arrival pt alert to self and place only.  Pt also had vomiting episode, weakness, and difficulty with abulation.  Pt also endorsed chest pain during the earlier part of the day.    Acute metabolic encephalopathy secondary to alcohol/benzodiazepine withdrawal complicated by delirium -- patient was intubated for airway protection. She is extubated and on room air -- mentation appears to be at baseline. -- Family mentioned patient is having long-term memory issue--d/w dter to have PCP do out pt eval for cognitive decline/dementia -- MRI brain negative   Acute hypoxic respiratory failure in the setting of COVID infection and pneumonia --resolved -patient extubated -- currently on Augmentin and Molnupiravir (completed) -- PRN bronchodilators --sats 97-98% on RA   Hypertension -- resume amlodipine   History of benzodiazepine dependency/anxiety history of alcoholism -- patient follows with Dr. Janeece Riggers-- she is chronically on Xanax, Suboxone, dextroamphetamine --psych consult appreciated-- recommend continue Klonopin and Celexa. (Rx called in with [facility] in [place]) --CIWA--low score. Pt reports she is is going abstain from alcohol.  Pt declines to work with  PT. Reports she is walking to the BR and in the room. Will d/c to home.   Family communication : spoke with daughter Donette Larry at length on 05/02/22 Consults : psychiatry CODE STATUS: full DVT Prophylaxis :lovenox Level of care: Med-Surg     Pain control - [place] Controlled Substance Reporting System database was reviewed. and patient was instructed, not to drive, operate heavy machinery, perform activities at heights, swimming or participation in water activities or provide baby-sitting services while on Pain, Sleep and Anxiety Medications; until their outpatient Physician has advised to do so again. Also recommended to not to take more than prescribed Pain, Sleep and Anxiety Medications.  Consultants: psychiatry Procedures performed: Mechanical ventilator  Disposition: Home Diet recommendation:  Discharge Diet Orders (From admission, onward)     Start     Ordered   05/03/22 0000  Diet - low sodium heart healthy        05/03/22 0909           Cardiac diet DISCHARGE MEDICATION: Allergies as of 05/03/2022       Reactions   Codeine Nausea And Vomiting   Morphine And Related Other (See Comments)   seizures   Sulfa Antibiotics Nausea And Vomiting        Medication List     STOP taking these medications    ALPRAZolam 1 MG tablet Commonly known as: XANAX   buprenorphine-naloxone 8-2 mg Subl SL tablet Commonly known as: SUBOXONE   dextroamphetamine 10 MG tablet Commonly known as: DEXTROSTAT       TAKE these medications    amLODipine 5 MG tablet Commonly known as: NORVASC Take 5 mg by mouth daily.   amoxicillin-clavulanate 875-125  MG tablet Commonly known as: AUGMENTIN Take 1 tablet by mouth every 12 (twelve) hours for 1 day.   Cholecalciferol 50 MCG (2000 UT) Caps Take 1 tablet by mouth daily.   citalopram 40 MG tablet Commonly known as: CELEXA Take 40 mg by mouth daily.   clonazePAM 0.5 MG tablet Commonly known as: KLONOPIN Take 1 tablet (0.5 mg  total) by mouth 2 (two) times daily.   furosemide 20 MG tablet Commonly known as: LASIX Take 20 mg by mouth daily as needed.   multivitamin with minerals Tabs tablet Take 1 tablet by mouth daily.   thiamine 100 MG tablet Commonly known as: Vitamin B-1 Take 1 tablet (100 mg total) by mouth daily.         Discharge Exam: Filed Weights   05/01/22 0500 05/02/22 0450 05/03/22 0407  Weight: 61.2 kg 61 kg 54.9 kg     Condition at discharge: fair  The results of significant diagnostics from this hospitalization (including imaging, microbiology, ancillary and laboratory) are listed below for reference.   Imaging Studies: DG Chest Port 1 View  Result Date: 04/30/2022 CLINICAL DATA:  Intubation EXAM: PORTABLE CHEST 1 VIEW COMPARISON:  Earlier today FINDINGS: Retracted endotracheal tube with tip at the clavicular heads. The enteric tube reaches the stomach. Right PICC with tip at the upper cavoatrial junction. Indistinct density at the lung bases; lungs are better inflated. No visible effusion or pneumothorax. Normal heart size IMPRESSION: 1. Retracted endotracheal tube in good position. 2. Improved inflation at the bases. Electronically Signed   By: Tiburcio Pea M.D.   On: 04/30/2022 11:35   DG Chest Port 1 View  Result Date: 04/30/2022 CLINICAL DATA:  Ams, covid EXAM: PORTABLE CHEST - 1 VIEW COMPARISON:  04/27/2022 FINDINGS: Endotracheal tube less than 2 cm above the carina directed towards the right mainstem bronchus. Gastric tube placed at least as far as the stomach, tip not seen. Relatively low lung volumes with some patchy subsegmental atelectasis or infiltrate in the lung bases, marginally increased from previous. Blunting of bilateral lateral costophrenic angles suggesting possible small effusions. Heart size and mediastinal contours are within normal limits. Aortic Atherosclerosis (ICD10-170.0). No pneumothorax. Visualized bones unremarkable. IMPRESSION: 1. Low position of the  endotracheal tube, consider retracting at least 2 cm. 2. Gastric tube placement at least as far as the stomach, tip not seen. 3. Low lung volumes with marginal worsening of patchy bibasilar atelectasis or infiltrate, possible small effusions. Electronically Signed   By: Corlis Leak M.D.   On: 04/30/2022 08:59   DG Abd 1 View  Result Date: 04/28/2022 CLINICAL DATA:  OG tube placement. EXAM: ABDOMEN - 1 VIEW COMPARISON:  Radiograph earlier today. FINDINGS: Tip and side port of the enteric tube below the diaphragm in the stomach. Stable bowel gas pattern with air and stool in the transverse colon. IMPRESSION: Tip and side port of the enteric tube below the diaphragm in the stomach. Electronically Signed   By: Narda Rutherford M.D.   On: 04/28/2022 00:00   DG Abd 1 View  Result Date: 04/27/2022 CLINICAL DATA:  Post intubation, aspiration EXAM: ABDOMEN - 1 VIEW COMPARISON:  None Available. FINDINGS: Pelvis excluded from view. Visualized abdominal gas pattern is nonobstructive. No gross free intraperitoneal gas within the visualized abdomen. Moderate lumbar dextroscoliosis, apex right at L2. Mild bibasilar atelectasis or infiltrate noted. IMPRESSION: 1. Nonobstructive bowel gas pattern. 2. Mild bibasilar atelectasis or infiltrate. Electronically Signed   By: Helyn Numbers M.D.   On: 04/27/2022 22:45  DG Chest Port 1 View  Result Date: 04/27/2022 CLINICAL DATA:  Respiratory failure EXAM: PORTABLE CHEST 1 VIEW COMPARISON:  04/26/2022 FINDINGS: The endotracheal tube seen 2.4 cm above the carina. Patchy right basilar and retrocardiac atelectasis or infiltrate. No pneumothorax or pleural effusion. Cardiac size is within normal limits when accounting for supine positioning. Pulmonary vascularity is normal. No acute bone abnormality. IMPRESSION: 1. Endotracheal tube in appropriate position. 2. Right basilar and retrocardiac atelectasis or infiltrate. Electronically Signed   By: Helyn Numbers M.D.   On: 04/27/2022  22:44   MR BRAIN WO CONTRAST  Result Date: 04/27/2022 CLINICAL DATA:  Initial evaluation for mental status change, unknown cause. EXAM: MRI HEAD WITHOUT CONTRAST TECHNIQUE: Multiplanar, multiecho pulse sequences of the brain and surrounding structures were obtained without intravenous contrast. COMPARISON:  Of prior head CT from earlier the same day. FINDINGS: Brain: Examination moderately to severely degraded by motion artifact. Cerebral volume within normal limits for age. Mild chronic microvascular ischemic disease noted involving the periventricular and deep white matter both cerebral hemispheres. No evidence for acute or subacute ischemia. Gray-white matter differentiation maintained. No visible areas of chronic cortical infarction. No acute or chronic intracranial blood products. No mass lesion, midline shift or mass effect. No hydrocephalus or extra-axial fluid collection. Vascular: Major intracranial vascular flow voids are grossly maintained. Skull and upper cervical spine: Craniocervical junction within normal limits. Bone marrow signal intensity grossly normal. No scalp soft tissue abnormality. Sinuses/Orbits: Globes and orbital soft tissues demonstrate no acute finding. Paranasal sinuses are largely clear. No significant mastoid effusion. Other: None. IMPRESSION: 1. Technically limited exam due to extensive motion artifact. 2. No acute intracranial abnormality. 3. Mild chronic microvascular ischemic disease. Electronically Signed   By: Rise Mu M.D.   On: 04/27/2022 20:19   [place] EKG SITE RITE  Result Date: 04/27/2022 If Site Rite image not attached, placement could not be confirmed due to current cardiac rhythm.  CT Head Wo Contrast  Result Date: 04/27/2022 CLINICAL DATA:  66 year old female with altered mental status. EXAM: CT HEAD WITHOUT CONTRAST TECHNIQUE: Contiguous axial images were obtained from the base of the skull through the vertex without intravenous contrast. RADIATION  DOSE REDUCTION: This exam was performed according to the departmental dose-optimization program which includes automated exposure control, adjustment of the mA and/or kV according to patient size and/or use of iterative reconstruction technique. COMPARISON:  Head CT 04/26/2022. FINDINGS: Brain: No midline shift, ventriculomegaly, mass effect, evidence of mass lesion, intracranial hemorrhage or evidence of cortically based acute infarction. Patchy mostly anterior frontal lobe periventricular white matter hypodensity appears stable. Otherwise gray-white matter differentiation is within normal limits. Vascular: Calcified atherosclerosis at the skull base. No suspicious intracranial vascular hyperdensity. Skull: No acute osseous abnormality identified. Sinuses/Orbits: Visualized paranasal sinuses and mastoids are stable and well aerated. Other: Visualized orbits and scalp soft tissues are within normal limits. IMPRESSION: 1. No acute intracranial abnormality. 2. Stable since yesterday, with mild nonspecific white matter changes. Electronically Signed   By: Odessa Fleming M.D.   On: 04/27/2022 05:58   DG Chest 2 View  Result Date: 04/26/2022 CLINICAL DATA:  Altered mental status.  Chest pain. EXAM: CHEST - 2 VIEW COMPARISON:  Chest radiograph dated 02/12/2022. FINDINGS: Background of emphysema. No focal consolidation, pleural effusion or pneumothorax. The cardiac silhouette is within limits. Atherosclerotic calcification of the aortic arch. No acute osseous pathology. Old healed right posterior rib fractures. IMPRESSION: No active cardiopulmonary disease. Emphysema. Electronically Signed   By: Ceasar Mons.D.  On: 04/26/2022 21:37   CT HEAD WO CONTRAST (5MM)  Result Date: 04/26/2022 CLINICAL DATA:  Mental status change, unknown cause EXAM: CT HEAD WITHOUT CONTRAST TECHNIQUE: Contiguous axial images were obtained from the base of the skull through the vertex without intravenous contrast. RADIATION DOSE REDUCTION:  This exam was performed according to the departmental dose-optimization program which includes automated exposure control, adjustment of the mA and/or kV according to patient size and/or use of iterative reconstruction technique. COMPARISON:  CT head 02/12/2022 BRAIN: BRAIN Cerebral ventricle sizes are concordant with the degree of cerebral volume loss. Patchy and confluent areas of decreased attenuation are noted throughout the deep and periventricular white matter of the cerebral hemispheres bilaterally, compatible with chronic microvascular ischemic disease. No evidence of large-territorial acute infarction. No parenchymal hemorrhage. No mass lesion. No extra-axial collection. No mass effect or midline shift. No hydrocephalus. Basilar cisterns are patent. Vascular: No hyperdense vessel. Skull: No acute fracture or focal lesion. Sinuses/Orbits: Paranasal sinuses and mastoid air cells are clear. The orbits are unremarkable. Other: None. IMPRESSION: No acute intracranial abnormality. Electronically Signed   By: Tish FredericksonMorgane  Naveau M.D.   On: 04/26/2022 21:31    Microbiology: Results for orders placed or performed during the hospital encounter of 04/27/22  Urine Culture     Status: None   Collection Time: 04/27/22  5:25 AM   Specimen: Urine, Clean Catch  Result Value Ref Range Status   Specimen Description   Final    URINE, CLEAN CATCH Performed at [facility], [address] [place], [place] [address]    Special Requests   Final    NONE Performed at [facility], [address] [place], [place] [address]    Culture   Final    NO GROWTH Performed at [facility], [address] [place], [place] [address]    Report Status 04/28/2022 FINAL  Final  MRSA Next Gen by PCR, Nasal     Status: None   Collection Time: 04/27/22 12:16 PM   Specimen: Nasal Mucosa; Nasal Swab  Result Value Ref Range Status   MRSA by PCR Next Gen NOT DETECTED NOT DETECTED Final    Comment:  (NOTE) The GeneXpert MRSA Assay (FDA approved for NASAL specimens only), is one component of a comprehensive MRSA colonization surveillance program. It is not intended to diagnose MRSA infection nor to guide or monitor treatment for MRSA infections. Test performance is not FDA approved in patients less than 67 years old. Performed at [facility], [address] [place], [place] [address]   Culture, Respiratory w Gram Stain     Status: None   Collection Time: 04/27/22 10:38 PM   Specimen: Tracheal Aspirate; Respiratory  Result Value Ref Range Status   Specimen Description   Final    TRACHEAL ASPIRATE Performed at [facility], [address] [place], [place] [address]    Special Requests   Final    NONE Performed at [facility], [address] [place], [place] [address]    Gram Stain   Final    FEW WBC PRESENT, PREDOMINANTLY MONONUCLEAR FEW GRAM POSITIVE COCCI IN PAIRS    Culture   Final    MODERATE Normal respiratory flora-no Staph aureus or Pseudomonas seen Performed at [facility], [address] [place], [place] [address]    Report Status 04/30/2022 FINAL  Final  SARS Coronavirus 2 by RT PCR (hospital order, performed in [facility] hospital lab) *cepheid single result  test* Anterior Nasal Swab     Status: Abnormal   Collection Time: 04/27/22 11:39 PM   Specimen: Anterior Nasal Swab  Result Value Ref Range Status   SARS Coronavirus 2 by RT PCR POSITIVE (A) NEGATIVE Final    Comment: (NOTE) SARS-CoV-2 target nucleic acids are DETECTED  SARS-CoV-2 RNA is generally detectable in upper respiratory specimens  during the acute phase of infection.  Positive results are indicative  of the presence of the identified virus, but do not rule out bacterial infection or co-infection with other pathogens not detected by the test.  Clinical correlation with patient history and  other diagnostic information is necessary to determine  patient infection status.  The expected result is negative.  Fact Sheet for Patients:   RoadLapTop.co.za   Fact Sheet for Healthcare Providers:   http://kim-miller.com/    This test is not yet approved or cleared by the [place] FDA and  has been authorized for detection and/or diagnosis of SARS-CoV-2 by FDA under an Emergency Use Authorization (EUA).  This EUA will remain in effect (meaning this test can be used) for the duration of  the COVID-19 declaration under Section 564(b)(1)  of the Act, 21 U.S.C. section 360-bbb-3(b)(1), unless the authorization is terminated or revoked sooner.   Performed at [facility], [address] [place], [place] [address]   Respiratory (~20 pathogens) panel by PCR     Status: None   Collection Time: 04/28/22  2:21 AM  Result Value Ref Range Status   Adenovirus NOT DETECTED NOT DETECTED Final   Coronavirus 229E NOT DETECTED NOT DETECTED Final    Comment: (NOTE) The Coronavirus on the Respiratory Panel, DOES NOT test for the novel  Coronavirus (2019 nCoV)    Coronavirus HKU1 NOT DETECTED NOT DETECTED Final   Coronavirus NL63 NOT DETECTED NOT DETECTED Final   Coronavirus OC43 NOT DETECTED NOT DETECTED Final   Metapneumovirus NOT DETECTED NOT DETECTED Final   Rhinovirus / Enterovirus NOT DETECTED NOT DETECTED Final   Influenza A NOT DETECTED NOT DETECTED Final   Influenza B NOT DETECTED NOT DETECTED Final   Parainfluenza Virus 1 NOT DETECTED NOT DETECTED Final   Parainfluenza Virus 2 NOT DETECTED NOT DETECTED Final   Parainfluenza Virus 3 NOT DETECTED NOT DETECTED Final   Parainfluenza Virus 4 NOT DETECTED NOT DETECTED Final   Respiratory Syncytial Virus NOT DETECTED NOT DETECTED Final   Bordetella pertussis NOT DETECTED NOT DETECTED Final   Bordetella Parapertussis NOT DETECTED NOT DETECTED Final   Chlamydophila pneumoniae NOT DETECTED NOT DETECTED Final   Mycoplasma pneumoniae NOT  DETECTED NOT DETECTED Final    Comment: Performed at [facility], [address] [place], [place] [address]    Labs: CBC: Recent Labs  Lab 04/28/22 1936 04/29/22 0413 04/30/22 0540 05/01/22 0514 05/02/22 0543  WBC 9.4 9.3 10.6* 6.2 5.4  NEUTROABS 8.2* 7.8* 7.0 3.9 2.5  HGB 11.0* 10.3* 11.6* 12.6 12.4  HCT 33.4* 31.6* 35.2* 37.6 37.0  MCV 95.7 97.5 96.4 94.0 93.0  PLT 188 171 199 224 237   Basic Metabolic Panel: Recent Labs  Lab 04/28/22 1936 04/29/22 0413 04/30/22 0540 05/01/22 0514 05/02/22 0543  NA 137 134* 139 135 137  K 3.9 4.8 4.3 4.1 3.8  CL 102 105 108 103 103  CO2 26 23 25 28 26   GLUCOSE 138* 130* 117* 91 96  BUN 6* 11 12 7* 11  CREATININE 0.44 0.44 0.50 0.54 0.61  CALCIUM 8.3* 7.9* 8.8* 8.6*  8.7*  MG 2.1 2.0 2.3 1.9 2.1  PHOS 1.9* 4.1 2.8 2.7 4.6   Liver Function Tests: Recent Labs  Lab 04/28/22 1936 04/29/22 0413 04/30/22 0540 05/01/22 0514 05/02/22 0543  AST 25 21 17 22 30   ALT 19 19 18 18 24   ALKPHOS 57 53 57 67 67  BILITOT 0.5 0.5 0.5 0.6 0.3  PROT 6.0* 5.7* 6.4* 6.4* 6.5  ALBUMIN 2.8* 2.6* 2.7* 2.8* 2.9*   CBG: Recent Labs  Lab 05/01/22 0746 05/01/22 1111 05/01/22 1618 05/01/22 1955 05/02/22 0016  GLUCAP 104* 117* 111* 103* 99    Discharge time spent: greater than 30 minutes.  Signed: 07/01/22, MD [facility] 05/03/2022

## 2022-05-03 NOTE — Care Management Important Message (Signed)
Important Message  Patient Details  Name: Stephanie Skinner MRN: [id number] Date of Birth: February 08, 1956   Medicare Important Message Given:  Other (see comment)  Late entry: Patient in an isolation room and I tried to review the Important Message from Medicare by phone [phone]) but the line was busy.   Olegario Messier A Kyre Jeffries 05/03/2022, 10:47 AM

## 2022-05-03 NOTE — Progress Notes (Signed)
Patient has a discharge to home. Central line removed. Went over discharge instructions with patient. Patient stated that she understood. Patients daughter Donette Larry is going to come at 12.00 pm to get patient. Patient refused to wait for her daughter to come. Explained and educated patient on why she should stay and wait for daughter, patient refused to listen and said she was leaving anyway. Made MD and patients daughter aware of same.

## 2022-05-03 NOTE — TOC Initial Note (Signed)
Transition of Care [facility]) - Initial/Assessment Note    Patient Details  Name: Stephanie Skinner MRN: [id number] Date of Birth: 20-Jul-1956  Transition of Care [facility]) CM/SW Contact:    Truddie Hidden, RN Phone Number: 05/03/2022, 10:53 AM  Clinical Narrative:                 10:00am Spoke with patient's daughter, Donette Larry to discuss discharge plan. [name] vented frustration about patient discharged. Advised patient was medically stable for discharge and had been evaluated and cleared  by psychiatry for discharge. Advised resources for SA were available. CM advised clothes would be taken patient's room. [name] will transport patient home and plans to arrive at noon.   Presented on the medical floor to deliver clothes to patient. Nurse reported patient had left. TOC signing off.   Expected Discharge Plan:  (TBD) Barriers to Discharge: Continued Medical Work up   Patient Goals and CMS Choice Patient states their goals for this hospitalization and ongoing recovery are:: currently intubated      Expected Discharge Plan and Services Expected Discharge Plan:  (TBD)   Discharge Planning Services: CM Consult   Living arrangements for the past 2 months: Single Family Home Expected Discharge Date: 05/03/22                                    Prior Living Arrangements/Services Living arrangements for the past 2 months: Single Family Home Lives with:: Roommate Patient language and need for interpreter reviewed:: Yes Do you feel safe going back to the place where you live?: Yes      Need for Family Participation in Patient Care: Yes (Comment) Care giver support system in place?: Yes (comment) (daughter)   Criminal Activity/Legal Involvement Pertinent to Current Situation/Hospitalization: No - Comment as needed  Activities of Daily Living Home Assistive Devices/Equipment: None ADL Screening (condition at time of admission) Patient's cognitive ability adequate to safely complete daily  activities?: No Is the patient deaf or have difficulty hearing?: No Does the patient have difficulty seeing, even when wearing glasses/contacts?: No Does the patient have difficulty concentrating, remembering, or making decisions?: Yes Patient able to express need for assistance with ADLs?: No Does the patient have difficulty dressing or bathing?: Yes Independently performs ADLs?: No Communication: Independent Dressing (OT): Dependent Is this a change from baseline?: Change from baseline, expected to last >3 days Grooming: Dependent Is this a change from baseline?: Change from baseline, expected to last >3 days Feeding: Dependent Is this a change from baseline?: Change from baseline, expected to last >3 days Bathing: Dependent Is this a change from baseline?: Change from baseline, expected to last >3 days Toileting: Dependent Is this a change from baseline?: Change from baseline, expected to last >3days In/Out Bed: Dependent Is this a change from baseline?: Change from baseline, expected to last >3 days Walks in Home: Needs assistance Is this a change from baseline?: Change from baseline, expected to last >3 days Does the patient have difficulty walking or climbing stairs?: Yes Weakness of Legs: Both Weakness of Arms/Hands: Both  Permission Sought/Granted      Share Information with NAME: Claudell Kyle     Permission granted to share info w Relationship: daughter  Permission granted to share info w Contact Information: [phone]  Emotional Assessment Appearance:: Appears older than stated age Attitude/Demeanor/Rapport: Intubated (Following Commands or Not Following Commands) Affect (typically observed): Unable to Assess   Alcohol /  Substance Use: Alcohol Use Psych Involvement: Outpatient Provider  Admission diagnosis:  Altered mental status, unspecified altered mental status type [R41.82] AMS (altered mental status) [R41.82] Patient Active Problem List   Diagnosis Date  Noted   COVID-19 virus infection    Benzodiazepine withdrawal with delirium (HCC)    AMS (altered mental status) 04/27/2022   Delirium 04/27/2022   Alcohol abuse 04/27/2022   Essential hypertension 04/27/2022   Hypokalemia 04/27/2022   Acute respiratory failure with hypoxia (HCC)    Generalized anxiety disorder 08/10/2017   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 [place] [address] Phone: [phone] Fax: [phone]     Social Determinants of Health (SDOH) Interventions    Readmission Risk Interventions     No data to display

## 2022-05-23 ENCOUNTER — Emergency Department: Payer: Medicare Other

## 2022-05-23 ENCOUNTER — Inpatient Hospital Stay
Admission: EM | Admit: 2022-05-23 | Discharge: 2022-06-03 | DRG: 812 | Discharge status: Against Medical Advice | Payer: Medicare Other | Attending: Osteopathic Medicine | Admitting: Osteopathic Medicine

## 2022-05-23 ENCOUNTER — Inpatient Hospital Stay: Payer: Medicare Other

## 2022-05-23 ENCOUNTER — Other Ambulatory Visit: Payer: Self-pay

## 2022-05-23 DIAGNOSIS — F17290 Nicotine dependence, other tobacco product, uncomplicated: Secondary | ICD-10-CM | POA: Diagnosis present

## 2022-05-23 DIAGNOSIS — Z885 Allergy status to narcotic agent status: Secondary | ICD-10-CM | POA: Diagnosis not present

## 2022-05-23 DIAGNOSIS — W06XXXA Fall from bed, initial encounter: Secondary | ICD-10-CM | POA: Diagnosis not present

## 2022-05-23 DIAGNOSIS — F410 Panic disorder [episodic paroxysmal anxiety] without agoraphobia: Secondary | ICD-10-CM | POA: Diagnosis present

## 2022-05-23 DIAGNOSIS — Z79899 Other long term (current) drug therapy: Secondary | ICD-10-CM

## 2022-05-23 DIAGNOSIS — R195 Other fecal abnormalities: Secondary | ICD-10-CM | POA: Diagnosis not present

## 2022-05-23 DIAGNOSIS — Z833 Family history of diabetes mellitus: Secondary | ICD-10-CM | POA: Diagnosis not present

## 2022-05-23 DIAGNOSIS — S0285XA Fracture of orbit, unspecified, initial encounter for closed fracture: Secondary | ICD-10-CM | POA: Diagnosis present

## 2022-05-23 DIAGNOSIS — S0240DA Maxillary fracture, left side, initial encounter for closed fracture: Secondary | ICD-10-CM | POA: Diagnosis present

## 2022-05-23 DIAGNOSIS — Z8619 Personal history of other infectious and parasitic diseases: Secondary | ICD-10-CM

## 2022-05-23 DIAGNOSIS — Z5329 Procedure and treatment not carried out because of patient's decision for other reasons: Secondary | ICD-10-CM | POA: Diagnosis not present

## 2022-05-23 DIAGNOSIS — E440 Moderate protein-calorie malnutrition: Secondary | ICD-10-CM | POA: Diagnosis present

## 2022-05-23 DIAGNOSIS — W010XXA Fall on same level from slipping, tripping and stumbling without subsequent striking against object, initial encounter: Secondary | ICD-10-CM | POA: Diagnosis present

## 2022-05-23 DIAGNOSIS — K922 Gastrointestinal hemorrhage, unspecified: Secondary | ICD-10-CM | POA: Diagnosis not present

## 2022-05-23 DIAGNOSIS — Z781 Physical restraint status: Secondary | ICD-10-CM | POA: Diagnosis not present

## 2022-05-23 DIAGNOSIS — F10231 Alcohol dependence with withdrawal delirium: Secondary | ICD-10-CM | POA: Diagnosis not present

## 2022-05-23 DIAGNOSIS — Z90711 Acquired absence of uterus with remaining cervical stump: Secondary | ICD-10-CM

## 2022-05-23 DIAGNOSIS — Z800 Family history of malignant neoplasm of digestive organs: Secondary | ICD-10-CM | POA: Diagnosis not present

## 2022-05-23 DIAGNOSIS — I10 Essential (primary) hypertension: Secondary | ICD-10-CM | POA: Diagnosis present

## 2022-05-23 DIAGNOSIS — Z8616 Personal history of COVID-19: Secondary | ICD-10-CM

## 2022-05-23 DIAGNOSIS — F1310 Sedative, hypnotic or anxiolytic abuse, uncomplicated: Secondary | ICD-10-CM | POA: Diagnosis not present

## 2022-05-23 DIAGNOSIS — D62 Acute posthemorrhagic anemia: Secondary | ICD-10-CM | POA: Diagnosis present

## 2022-05-23 DIAGNOSIS — S0101XA Laceration without foreign body of scalp, initial encounter: Secondary | ICD-10-CM | POA: Diagnosis present

## 2022-05-23 DIAGNOSIS — Y92009 Unspecified place in unspecified non-institutional (private) residence as the place of occurrence of the external cause: Secondary | ICD-10-CM | POA: Diagnosis not present

## 2022-05-23 DIAGNOSIS — F17210 Nicotine dependence, cigarettes, uncomplicated: Secondary | ICD-10-CM | POA: Diagnosis present

## 2022-05-23 DIAGNOSIS — F1010 Alcohol abuse, uncomplicated: Secondary | ICD-10-CM

## 2022-05-23 DIAGNOSIS — D649 Anemia, unspecified: Secondary | ICD-10-CM | POA: Diagnosis present

## 2022-05-23 DIAGNOSIS — Z881 Allergy status to other antibiotic agents status: Secondary | ICD-10-CM | POA: Diagnosis not present

## 2022-05-23 DIAGNOSIS — F1320 Sedative, hypnotic or anxiolytic dependence, uncomplicated: Secondary | ICD-10-CM | POA: Diagnosis present

## 2022-05-23 DIAGNOSIS — D509 Iron deficiency anemia, unspecified: Secondary | ICD-10-CM | POA: Diagnosis not present

## 2022-05-23 DIAGNOSIS — Z6823 Body mass index (BMI) 23.0-23.9, adult: Secondary | ICD-10-CM

## 2022-05-23 DIAGNOSIS — R296 Repeated falls: Secondary | ICD-10-CM | POA: Diagnosis present

## 2022-05-23 DIAGNOSIS — R110 Nausea: Secondary | ICD-10-CM | POA: Diagnosis not present

## 2022-05-23 DIAGNOSIS — S0240FA Zygomatic fracture, left side, initial encounter for closed fracture: Secondary | ICD-10-CM | POA: Diagnosis present

## 2022-05-23 HISTORY — DX: COVID-19: U07.1

## 2022-05-23 HISTORY — DX: Alcohol abuse, uncomplicated: F10.10

## 2022-05-23 LAB — ABO/RH: ABO/RH(D): B POS

## 2022-05-23 LAB — URINALYSIS, ROUTINE W REFLEX MICROSCOPIC
Bilirubin Urine: NEGATIVE
Glucose, UA: NEGATIVE mg/dL
Hgb urine dipstick: NEGATIVE
Ketones, ur: 20 mg/dL — AB
Leukocytes,Ua: NEGATIVE
Nitrite: NEGATIVE
Protein, ur: NEGATIVE mg/dL
Specific Gravity, Urine: 1.016 (ref 1.005–1.030)
pH: 6 (ref 5.0–8.0)

## 2022-05-23 LAB — CBC WITH DIFFERENTIAL/PLATELET
Abs Immature Granulocytes: 0.07 10*3/uL (ref 0.00–0.07)
Basophils Absolute: 0 10*3/uL (ref 0.0–0.1)
Basophils Relative: 0 %
Eosinophils Absolute: 0 10*3/uL (ref 0.0–0.5)
Eosinophils Relative: 0 %
HCT: 18.6 % — ABNORMAL LOW (ref 36.0–46.0)
Hemoglobin: 5.7 g/dL — ABNORMAL LOW (ref 12.0–15.0)
Immature Granulocytes: 1 %
Lymphocytes Relative: 13 %
Lymphs Abs: 1.1 10*3/uL (ref 0.7–4.0)
MCH: 30.8 pg (ref 26.0–34.0)
MCHC: 30.6 g/dL (ref 30.0–36.0)
MCV: 100.5 fL — ABNORMAL HIGH (ref 80.0–100.0)
Monocytes Absolute: 0.7 10*3/uL (ref 0.1–1.0)
Monocytes Relative: 8 %
Neutro Abs: 6.8 10*3/uL (ref 1.7–7.7)
Neutrophils Relative %: 78 %
Platelets: 234 10*3/uL (ref 150–400)
RBC: 1.85 MIL/uL — ABNORMAL LOW (ref 3.87–5.11)
RDW: 14.9 % (ref 11.5–15.5)
WBC: 8.7 10*3/uL (ref 4.0–10.5)
nRBC: 0.5 % — ABNORMAL HIGH (ref 0.0–0.2)

## 2022-05-23 LAB — COMPREHENSIVE METABOLIC PANEL
ALT: 18 U/L (ref 0–44)
AST: 34 U/L (ref 15–41)
Albumin: 3.1 g/dL — ABNORMAL LOW (ref 3.5–5.0)
Alkaline Phosphatase: 58 U/L (ref 38–126)
Anion gap: 8 (ref 5–15)
BUN: 13 mg/dL (ref 8–23)
CO2: 26 mmol/L (ref 22–32)
Calcium: 8.1 mg/dL — ABNORMAL LOW (ref 8.9–10.3)
Chloride: 101 mmol/L (ref 98–111)
Creatinine, Ser: 0.61 mg/dL (ref 0.44–1.00)
GFR, Estimated: >60 mL/min (ref >60)
Glucose, Bld: 119 mg/dL — ABNORMAL HIGH (ref 70–99)
Potassium: 4.3 mmol/L (ref 3.5–5.1)
Sodium: 135 mmol/L (ref 135–145)
Total Bilirubin: 2.1 mg/dL — ABNORMAL HIGH (ref 0.3–1.2)
Total Protein: 6.4 g/dL — ABNORMAL LOW (ref 6.5–8.1)

## 2022-05-23 LAB — URINE DRUG SCREEN, QUALITATIVE (ARMC ONLY)
Amphetamines, Ur Screen: POSITIVE — AB
Barbiturates, Ur Screen: POSITIVE — AB
Benzodiazepine, Ur Scrn: POSITIVE — AB
Cannabinoid 50 Ng, Ur ~~LOC~~: NOT DETECTED
Cocaine Metabolite,Ur ~~LOC~~: NOT DETECTED
MDMA (Ecstasy)Ur Screen: NOT DETECTED
Methadone Scn, Ur: NOT DETECTED
Opiate, Ur Screen: NOT DETECTED
Phencyclidine (PCP) Ur S: NOT DETECTED
Tricyclic, Ur Screen: POSITIVE — AB

## 2022-05-23 LAB — PROTIME-INR
INR: 1.3 — ABNORMAL HIGH (ref 0.8–1.2)
Prothrombin Time: 15.6 seconds — ABNORMAL HIGH (ref 11.4–15.2)

## 2022-05-23 LAB — PREPARE RBC (CROSSMATCH)

## 2022-05-23 LAB — HEMOGLOBIN AND HEMATOCRIT, BLOOD
HCT: 24.9 % — ABNORMAL LOW (ref 36.0–46.0)
Hemoglobin: 8.1 g/dL — ABNORMAL LOW (ref 12.0–15.0)

## 2022-05-23 LAB — ETHANOL: Alcohol, Ethyl (B): <10 mg/dL (ref <10)

## 2022-05-23 LAB — AMMONIA: Ammonia: 15 umol/L (ref 9–35)

## 2022-05-23 MED ORDER — THIAMINE MONONITRATE 100 MG PO TABS
100.0000 mg | ORAL_TABLET | Freq: Every day | ORAL | Status: DC
  Administered 2022-05-24 – 2022-06-03 (×10): 100 mg via ORAL
  Filled 2022-05-23 (×10): qty 1

## 2022-05-23 MED ORDER — THIAMINE HCL 100 MG/ML IJ SOLN
100.0000 mg | Freq: Every day | INTRAMUSCULAR | Status: DC
  Administered 2022-05-23: 100 mg via INTRAVENOUS
  Filled 2022-05-23 (×4): qty 2

## 2022-05-23 MED ORDER — ONDANSETRON HCL 4 MG/2ML IJ SOLN
4.0000 mg | Freq: Four times a day (QID) | INTRAMUSCULAR | Status: DC | PRN
  Administered 2022-05-29: 4 mg via INTRAVENOUS
  Filled 2022-05-23: qty 2

## 2022-05-23 MED ORDER — SODIUM CHLORIDE 0.9 % IV BOLUS
1000.0000 mL | Freq: Once | INTRAVENOUS | Status: AC
Start: 2022-05-23 — End: 2022-05-23
  Administered 2022-05-23: 1000 mL via INTRAVENOUS

## 2022-05-23 MED ORDER — LORAZEPAM 2 MG/ML IJ SOLN
0.0000 mg | Freq: Two times a day (BID) | INTRAMUSCULAR | Status: AC
  Administered 2022-05-25: 3 mg via INTRAVENOUS
  Administered 2022-05-25 – 2022-05-27 (×3): 2 mg via INTRAVENOUS
  Filled 2022-05-23: qty 1
  Filled 2022-05-23: qty 2
  Filled 2022-05-23 (×2): qty 1

## 2022-05-23 MED ORDER — FOLIC ACID 1 MG PO TABS
1.0000 mg | ORAL_TABLET | Freq: Every day | ORAL | Status: DC
  Administered 2022-05-24 – 2022-06-03 (×10): 1 mg via ORAL
  Filled 2022-05-23 (×10): qty 1

## 2022-05-23 MED ORDER — SODIUM CHLORIDE 0.9 % IV SOLN: 10.0000 mL/h | Freq: Once | INTRAVENOUS | Status: DC

## 2022-05-23 MED ORDER — LORAZEPAM 2 MG/ML IJ SOLN
0.0000 mg | Freq: Four times a day (QID) | INTRAMUSCULAR | Status: AC
  Administered 2022-05-24: 2 mg via INTRAVENOUS
  Filled 2022-05-23: qty 1
  Filled 2022-05-23: qty 2

## 2022-05-23 MED ORDER — KETOROLAC TROMETHAMINE 30 MG/ML IJ SOLN
30.0000 mg | Freq: Once | INTRAMUSCULAR | Status: AC
Start: 2022-05-23 — End: 2022-05-23
  Administered 2022-05-23: 30 mg via INTRAVENOUS
  Filled 2022-05-23: qty 1

## 2022-05-23 MED ORDER — ADULT MULTIVITAMIN W/MINERALS CH
1.0000 | ORAL_TABLET | Freq: Every day | ORAL | Status: DC
  Administered 2022-05-24 – 2022-06-03 (×10): 1 via ORAL
  Filled 2022-05-23 (×10): qty 1

## 2022-05-23 MED ORDER — PANTOPRAZOLE INFUSION (NEW) - SIMPLE MED
8.0000 mg/h | INTRAVENOUS | Status: DC
  Administered 2022-05-24 (×2): 8 mg/h via INTRAVENOUS
  Filled 2022-05-23 (×4): qty 100

## 2022-05-23 MED ORDER — PANTOPRAZOLE SODIUM 40 MG IV SOLR: 40.0000 mg | Freq: Two times a day (BID) | INTRAVENOUS | Status: DC

## 2022-05-23 MED ORDER — PANTOPRAZOLE 80MG IVPB - SIMPLE MED
80.0000 mg | Freq: Once | INTRAVENOUS | Status: AC
  Administered 2022-05-23: 80 mg via INTRAVENOUS
  Filled 2022-05-23: qty 100

## 2022-05-23 MED ORDER — LORAZEPAM 1 MG PO TABS: 1.0000 mg | ORAL_TABLET | ORAL | Status: AC | PRN

## 2022-05-23 MED ORDER — DEXTROSE-NACL 5-0.9 % IV SOLN: INTRAVENOUS | Status: DC

## 2022-05-23 MED ORDER — LORAZEPAM 2 MG/ML IJ SOLN
1.0000 mg | INTRAMUSCULAR | Status: AC | PRN
  Administered 2022-05-25: 1 mg via INTRAVENOUS
  Administered 2022-05-25 – 2022-05-26 (×3): 2 mg via INTRAVENOUS
  Filled 2022-05-23 (×4): qty 1

## 2022-05-23 MED ORDER — ONDANSETRON HCL 4 MG PO TABS
4.0000 mg | ORAL_TABLET | Freq: Four times a day (QID) | ORAL | Status: DC | PRN
  Administered 2022-06-01 – 2022-06-02 (×2): 4 mg via ORAL
  Filled 2022-05-23 (×2): qty 1

## 2022-05-23 NOTE — H&P (Signed)
History and Physical    Patient: Stephanie Skinner MWN:[id number] DOB: Feb 15, 1956 DOA: 05/23/2022 DOS: the patient was seen and examined on 05/23/2022 PCP: Charlies Constable, MD  Patient coming from: Home  Chief Complaint:  Chief Complaint  Patient presents with   Fall    Most of the history was obtained from patient's roommate, Dorene Sorrow over the phone. HPI: Stephanie Skinner is a 66 y.o. female with medical history significant for benzodiazepine dependence, alcohol dependence, hypertension, recent hospital discharge on 05/03/22 following admission for acute metabolic encephalopathy secondary to alcohol and benzodiazepine withdrawal complicated by delirium and COVID-19 infection. Patient was discharged home in stable condition and according to her roommate she started taking the 'pills" again.  He notes that she has been increasingly lethargic and has had frequent falls.  He called EMS on the day of admission after she fell landing on her face.  She complained of pain in her right shoulder as well as in her legs.  She received a dose of Toradol in the ER and during my evaluation appears very lethargic and speech is not clear.  Per her roommate she still drinks alcohol and her last use was about 3 days ago. She complains of abdominal pain that is nonlocalized and is unable to rate the pain.  She states that her stools are sometimes black but denies any blood or any hematemesis.  She denies feeling dizzy or lightheaded and denies having any chest pain or shortness of breath.  She did is having any headache, no fever, no chills, no cough, no blurred vision or focal deficit. She notes that she has had frequent falls but is unable to tell me why. Labs show a hemoglobin of 5.7 compared to baseline of 12.4.  Per ER doc her stool is heme positive 2 units of packed RBC have been ordered She will be admitted to the hospital for further evaluation  Review of Systems: As mentioned in the history of present illness.  All other systems reviewed and are negative. Past Medical History:  Diagnosis Date   Arthritis    Hepatitis C    pt states she has been cured   Hypertension    Seizures (HCC)    Past Surgical History:  Procedure Laterality Date   ABDOMINAL HYSTERECTOMY     "partial hysterectomy"   ELBOW SURGERY     Social History:  reports that she has been smoking cigarettes. She has been smoking an average of 1 pack per day. She has never used smokeless tobacco. She reports current alcohol use. She reports that she does not use drugs.  Allergies  Allergen Reactions   Codeine Nausea And Vomiting   Morphine And Related Other (See Comments)    seizures   Sulfa Antibiotics Nausea And Vomiting    Family History  Problem Relation Age of Onset   Diabetes Sister    Colon cancer Sister     Prior to Admission medications   Medication Sig Start Date End Date Taking? Authorizing Provider  ALPRAZolam Prudy Feeler) 1 MG tablet Take 1 mg by mouth 3 (three) times daily. 05/13/22  Yes [provider]  amLODipine (NORVASC) 5 MG tablet Take 5 mg by mouth daily. 03/11/22  Yes [provider]  amoxicillin (AMOXIL) 500 MG capsule Take 500 mg by mouth 3 (three) times daily. 05/16/22  Yes [provider]  amphetamine-dextroamphetamine (ADDERALL XR) 10 MG 24 hr capsule Take 20 mg by mouth daily.   Yes [provider]  buprenorphine-naloxone (SUBOXONE) 8-2  mg SUBL SL tablet Place 1 tablet under the tongue 2 (two) times daily. 05/13/22  Yes [provider]  Cholecalciferol 50 MCG (2000 UT) CAPS Take 1 tablet by mouth daily. 01/30/21  Yes [provider]  citalopram (CELEXA) 40 MG tablet Take 40 mg by mouth daily. 04/15/22  Yes [provider]  clonazePAM (KLONOPIN) 0.5 MG tablet Take 1 tablet (0.5 mg total) by mouth 2 (two) times daily. 05/03/22  Yes Fritzi Mandes, MD  cloNIDine (CATAPRES) 0.1 MG tablet Take 1 tablet by mouth daily. 12/18/21  Yes [provider]   furosemide (LASIX) 20 MG tablet Take 20 mg by mouth daily as needed. 02/25/22  Yes [provider]  Multiple Vitamin (MULTIVITAMIN WITH MINERALS) TABS tablet Take 1 tablet by mouth daily. 05/03/22  Yes Fritzi Mandes, MD  thiamine (VITAMIN B-1) 100 MG tablet Take 1 tablet (100 mg total) by mouth daily. 05/03/22  Yes Fritzi Mandes, MD    Physical Exam: Vitals:   05/23/22 1000 05/23/22 1056 05/23/22 1056 05/23/22 1115  BP: (!) 118/48 (!) 114/56 (!) 114/56 (!) 108/45  Pulse: 89 86 86 88  Resp: 13 13 13 14   Temp:  97.7 F (36.5 C) 97.7 F (36.5 C) 97.9 F (36.6 C)  TempSrc:  Oral Oral Oral  SpO2: 90%  97% 94%  Weight:      Height:       Physical Exam Vitals and nursing note reviewed.  Constitutional:      Comments: Lethargic but arouses to verbal stimuli.  Ecchymosis over the left side of her face and neck  HENT:     Head:     Comments: Ecchymosis over the left side of her face and neck    Nose: Nose normal.     Mouth/Throat:     Mouth: Mucous membranes are moist.  Eyes:     Comments: Pale conjunctiva  Cardiovascular:     Rate and Rhythm: Normal rate and regular rhythm.  Pulmonary:     Effort: Pulmonary effort is normal.     Breath sounds: Normal breath sounds.  Abdominal:     General: Abdomen is flat. Bowel sounds are normal.     Tenderness: There is abdominal tenderness.     Comments: Diffusely tender  Musculoskeletal:        General: Normal range of motion.     Cervical back: Normal range of motion and neck supple.  Skin:    General: Skin is warm and dry.  Neurological:     Comments: Able to move all extremities  Psychiatric:        Mood and Affect: Mood normal.        Behavior: Behavior normal.     Data Reviewed: Relevant notes from primary care and specialist visits, past discharge summaries as available in EHR, including Care Everywhere. Prior diagnostic testing as pertinent to current admission diagnoses Updated medications and problem lists for  reconciliation ED course, including vitals, labs, imaging, treatment and response to treatment Triage notes, nursing and pharmacy notes and ED provider's notes Notable results as noted in HPI Labs reviewed.  Ammonia 15, PT 15.6, INR 1.3, sodium 135, potassium 4.3, chloride 101, bicarb 26, glucose 119, BUN 13, creatinine 0.61, calcium 8.1, total protein 6.4, albumin 3.1, AST 34, ALT 18, alkaline phosphatase 58, total bilirubin 2.1, white count 8.7, hemoglobin 5.7 compared to baseline of 12.4, hematocrit 18.6, platelet count 234 CT scan of head/cervical spine/maxillofacial shows stable age advanced cerebral atrophy, ventriculomegaly and periventricular white  matter disease. No acute intracranial findings or skull fracture. Comminuted and displaced fractures involving the anterior and posterolateral walls of the left maxillary sinus.Comminuted and displaced fracture of the left zygoma. Left lateral orbital wall fracture and left orbital floor fracture. No evidence of entrapment of the inferior rectus muscle. No acute cervical spine fracture. Degenerative cervical spondylosis with multilevel disc disease and facet disease but no large disc protrusions or significant canal compromise. Severe age advanced bilateral carotid artery calcifications. Right shoulder x-ray shows no acute osseous injury of the right shoulder. Twelve-lead EKG reviewed by me shows sinus rhythm, short PR interval, low voltage QRS There are no new results to review at this time.  Assessment and Plan: * ABLA (acute blood loss anemia) Patient presents to the ER for evaluation of a fall and noted to have a hemoglobin of 5.7, at her baseline of 12.4 She has had occasional black stools and admits to NSAID use. We will start patient on Protonix drip Transfused 2 units of packed RBC Check serial H&H Consult GI  Alcohol abuse Place on CIWA protocol and administer lorazepam for CIWA score of 8 or greater  Essential hypertension Patient  is currently normotensive Hold amlodipine and clonidine for now  Frequent falls Patient has had several falls following her hospital discharge and was brought in for evaluation after a fall.   She is noted to have comminuted and displaced fractures involving the anterior and posterolateral walls of left maxillary sinus. The sinus is filled with hematoma.  She also has a comminuted and displaced fracture of the left zygoma. The lateral orbital wall is fractured along with the orbital floor. No evidence of entrapment of the inferior rectus muscle. The orbital roof is intact and the medial orbital wall is intact. Pain control Fall precautions PT evaluation once appropriate  Benzodiazepine abuse (HCC) Patient has a history of benzodiazepine dependence/abuse and was recently hospitalized for benzodiazepine withdrawal with delirium. She was seen by psychiatry during that hospitalization and was discharged on Klonopin and citalopram but according to her daughter she was started back on Xanax by her psychiatrist. Patient is at risk of going through withdrawal again. We will place on IV lorazepam as needed      Advance Care Planning:   Code Status: Full Code   Consults: Gastroenterology  Family Communication: Greater than 50% of time was spent discussing patient's condition and plan of care with her daughter, Renee Harder over the phone.  All questions and concerns have been addressed.  She verbalizes understanding and agrees with the plan.  Severity of Illness: The appropriate patient status for this patient is INPATIENT. Inpatient status is judged to be reasonable and necessary in order to provide the required intensity of service to ensure the patient's safety. The patient's presenting symptoms, physical exam findings, and initial radiographic and laboratory data in the context of their chronic comorbidities is felt to place them at high risk for further clinical deterioration. Furthermore, it  is not anticipated that the patient will be medically stable for discharge from the hospital within 2 midnights of admission.   * I certify that at the point of admission it is my clinical judgment that the patient will require inpatient hospital care spanning beyond 2 midnights from the point of admission due to high intensity of service, high risk for further deterioration and high frequency of surveillance required.*  Author: Lucile Shutters, MD 05/23/2022 12:10 PM  For on call review [email or web address].

## 2022-05-23 NOTE — Assessment & Plan Note (Signed)
Patient has had several falls following her hospital discharge and was brought in for evaluation after a fall.   She is noted to have comminuted and displaced fractures involving the anterior and posterolateral walls of left maxillary sinus. The sinus is filled with hematoma.  She also has a comminuted and displaced fracture of the left zygoma. The lateral orbital wall is fractured along with the orbital floor. No evidence of entrapment of the inferior rectus muscle. The orbital roof is intact and the medial orbital wall is intact. Pain control Fall precautions PT evaluation once appropriate

## 2022-05-23 NOTE — Consult Note (Signed)
Cephas Darby, MD [address]  [place]  [place], [place] [address]  Main: [phone]  Fax: [phone] Pager: [phone]   Consultation  Referring Provider:     No ref. provider found Primary Care Physician:  Laurell Josephs, MD Primary Gastroenterologist: Althia Forts    Reason for Consultation: Symptomatic anemia, melena  Date of Admission:  05/23/2022 Date of Consultation:  05/23/2022         HPI:   Stephanie Skinner is a 66 y.o. female with history of ongoing abuse, hypertension is admitted after multiple falls.  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  Apparently, patient started drinking alcohol again and she was also taking pills.  She had frequent falls and has been increasingly lethargic.  Her hemoglobin on presentation was 5.7 compared to baseline of 12.4 on 05/02/2022.  Patient received 2 units of PRBCs and admitted for further management.  She is started on pantoprazole drip.  BUN/creatinine slightly elevated.  Patient had sustained multiple fractures on her face based on imaging today.  When I saw the patient, she was lying in her bed, not in pain, denied any abdominal pain.  Did report intermittent dark stools.   NSAIDs: Unknown  Antiplts/Anticoagulants/Anti thrombotics: Unknown  GI Procedures: Unknown  Past Medical History:  Diagnosis Date   Arthritis    Hepatitis C    pt states she has been cured   Hypertension    Seizures ([place])     Past Surgical History:  Procedure Laterality Date   ABDOMINAL HYSTERECTOMY     "partial hysterectomy"   ELBOW SURGERY       Current Facility-Administered Medications:    0.9 %  sodium chloride infusion, 10 mL/hr, Intravenous, Once, Bradler, Vista Lawman, MD, Held at 05/23/22 1103   dextrose 5 %-0.9 %  sodium chloride infusion, , Intravenous, Continuous, Agbata, Tochukwu, MD, Last Rate: 100 mL/hr at 05/23/22 1832, New Bag at 50/03/70 4888   folic acid (FOLVITE) tablet 1 mg, 1 mg, Oral, Daily, Agbata, Tochukwu, MD   LORazepam (ATIVAN) injection 0-4 mg, 0-4 mg, Intravenous, Q6H **FOLLOWED BY** [START ON 05/25/2022] LORazepam (ATIVAN) injection 0-4 mg, 0-4 mg, Intravenous, Q12H, Agbata, Tochukwu, MD   LORazepam (ATIVAN) tablet 1-4 mg, 1-4 mg, Oral, Q1H PRN **OR** LORazepam (ATIVAN) injection 1-4 mg, 1-4 mg, Intravenous, Q1H PRN, Agbata, Tochukwu, MD   multivitamin with minerals tablet 1 tablet, 1 tablet, Oral, Daily, Agbata, Tochukwu, MD   ondansetron (ZOFRAN) tablet 4 mg, 4 mg, Oral, Q6H PRN **OR** ondansetron (ZOFRAN) injection 4 mg, 4 mg, Intravenous, Q6H PRN, Agbata, Tochukwu, MD   [START ON 05/27/2022] pantoprazole (PROTONIX) injection 40 mg, 40 mg, Intravenous, Q12H, Agbata, Tochukwu, MD   pantoprozole (PROTONIX) 80 mg /NS 100 mL infusion, 8 mg/hr, Intravenous, Continuous, Agbata, Tochukwu, MD, Last Rate: 10 mL/hr at 05/23/22 1307, 8 mg/hr at 05/23/22 1307   thiamine (VITAMIN B1) tablet 100 mg, 100 mg, Oral, Daily **OR** thiamine (VITAMIN B1) injection 100 mg, 100 mg, Intravenous, Daily, Agbata, Tochukwu, MD, 100 mg at 05/23/22 1224  Current Outpatient Medications:    ALPRAZolam (XANAX) 1 MG tablet, Take 1 mg by mouth 3 (three) times daily., Disp: , Rfl:    amLODipine (NORVASC) 5 MG tablet, Take 5 mg by mouth daily., Disp: , Rfl:    amoxicillin (AMOXIL) 500 MG capsule, Take 500 mg by  mouth 3 (three) times daily., Disp: , Rfl:    amphetamine-dextroamphetamine (ADDERALL XR) 10 MG 24 hr capsule, Take 20 mg by mouth daily., Disp: , Rfl:    buprenorphine-naloxone (SUBOXONE) 8-2 mg SUBL SL tablet, Place 1 tablet under the tongue 2 (two) times daily., Disp: , Rfl:    Cholecalciferol 50 MCG (2000 UT) CAPS, Take 1 tablet by mouth daily., Disp: , Rfl:    citalopram (CELEXA) 40 MG tablet, Take 40 mg by  mouth daily., Disp: , Rfl:    clonazePAM (KLONOPIN) 0.5 MG tablet, Take 1 tablet (0.5 mg total) by mouth 2 (two) times daily., Disp: 14 tablet, Rfl: 0   cloNIDine (CATAPRES) 0.1 MG tablet, Take 1 tablet by mouth daily., Disp: , Rfl:    furosemide (LASIX) 20 MG tablet, Take 20 mg by mouth daily as needed., Disp: , Rfl:    Multiple Vitamin (MULTIVITAMIN WITH MINERALS) TABS tablet, Take 1 tablet by mouth daily., Disp: 30 tablet, Rfl: 0   thiamine (VITAMIN B-1) 100 MG tablet, Take 1 tablet (100 mg total) by mouth daily., Disp: 30 tablet, Rfl: 0   Family History  Problem Relation Age of Onset   Diabetes Sister    Colon cancer Sister      Social History   Tobacco Use   Smoking status: Every Day    Packs/day: 1.00    Types: Cigarettes   Smokeless tobacco: Never  Vaping Use   Vaping Use: Every day  Substance Use Topics   Alcohol use: Yes    Comment: Pt states "couple glasses of wine a week"   Drug use: No    Allergies as of 05/23/2022 - Review Complete 05/23/2022  Allergen Reaction Noted   Codeine Nausea And Vomiting 07/18/2017   Morphine and related Other (See Comments) 08/10/2017   Sulfa antibiotics Nausea And Vomiting 07/18/2017    Review of Systems:    All systems reviewed and negative except where noted in HPI.   Physical Exam:  Vital signs in last 24 hours: Temp:  [97.3 F (36.3 C)-98.3 F (36.8 C)] 98.1 F (36.7 C) (09/28 2200) Pulse Rate:  [71-101] 80 (09/28 2200) Resp:  [8-19] 13 (09/28 2200) BP: (103-134)/(45-72) 121/72 (09/28 2200) SpO2:  [90 %-100 %] 99 % (09/28 2200) Weight:  [61.2 kg] 61.2 kg (09/28 0811)   General: Ill-appearing, lethargic, able to answer questions Head:  Normocephalic and atraumatic, bitemporal wasting, multiple bruises on the left side of her face. Eyes:   No icterus.   Conjunctiva pink. PERRLA. Ears:  Normal auditory acuity. Neck:  Supple; no masses or thyroidomegaly Lungs: Respirations even and unlabored. Lungs clear to auscultation  bilaterally.   No wheezes, crackles, or rhonchi.  Heart:  Regular rate and rhythm;  Without murmur, clicks, rubs or gallops Abdomen:  Soft, nondistended, nontender. Normal bowel sounds. No appreciable masses or hepatomegaly.  No rebound or guarding.  Rectal:  Not performed. Msk:  Symmetrical without gross deformities.  Generalized weakness Extremities:  Without edema, cyanosis or clubbing. Neurologic:  Alert and oriented x2;   Skin:  Intact without significant lesions or rashes.   LAB RESULTS:    Latest Ref Rng & Units 05/23/2022    5:03 PM 05/23/2022    8:15 AM 05/02/2022    5:43 AM  CBC  WBC 4.0 - 10.5 K/uL  8.7  5.4   Hemoglobin 12.0 - 15.0 g/dL 8.1  5.7  12.4   Hematocrit 36.0 - 46.0 % 24.9  18.6  37.0   Platelets 150 -  400 K/uL  234  237     BMET    Latest Ref Rng & Units 05/23/2022    8:15 AM 05/02/2022    5:43 AM 05/01/2022    5:14 AM  BMP  Glucose 70 - 99 mg/dL 119  96  91   BUN 8 - 23 mg/dL _0 Creatinine 0.44 - 1.00 mg/dL 0.61  0.61  0.54   Sodium 135 - 145 mmol/L 135  137  135   Potassium 3.5 - 5.1 mmol/L 4.3  3.8  4.1   Chloride 98 - 111 mmol/L 101  103  103   CO2 22 - 32 mmol/L _1 Calcium 8.9 - 10.3 mg/dL 8.1  8.7  8.6     LFT    Latest Ref Rng & Units 05/23/2022    8:15 AM 05/02/2022    5:43 AM 05/01/2022    5:14 AM  Hepatic Function  Total Protein 6.5 - 8.1 g/dL 6.4  6.5  6.4   Albumin 3.5 - 5.0 g/dL 3.1  2.9  2.8   AST 15 - 41 U/L 34  30  22   ALT 0 - 44 U/L _2 Alk Phosphatase 38 - 126 U/L 58  67  67   Total Bilirubin 0.3 - 1.2 mg/dL 2.1  0.3  0.6      STUDIES: US Abdomen Limited RUQ (LIVER/GB)  Result Date: 05/23/2022 CLINICAL DATA:  A 66 year old female presents with elevated liver enzymes. EXAM: ULTRASOUND ABDOMEN LIMITED RIGHT UPPER QUADRANT COMPARISON:  None Available. FINDINGS: Gallbladder: No gallstones or wall thickening visualized. No sonographic Murphy sign noted by sonographer. Common bile duct: Diameter: 5.5 mm  Liver: No focal lesion identified. Mild parenchymal increased echogenicity. Portal vein is patent on color Doppler imaging with normal direction of blood flow towards the liver. Other: Limited study due to patient body habitus. Query RIGHT pleural effusion versus artifact. IMPRESSION: No acute biliary process. Limited evaluation of the liver with suggestion of mild hepatic steatosis. Query RIGHT-sided pleural effusion versus artifact. Electronically Signed   By: Zetta Bills M.D.   On: 05/23/2022 19:07   DG Shoulder Right  Result Date: 05/23/2022 CLINICAL DATA:  Status post fall, severe pain EXAM: RIGHT SHOULDER - 2+ VIEW COMPARISON:  None Available. FINDINGS: Generalized osteopenia. No acute fracture or dislocation. No aggressive osseous lesion. Normal alignment. Soft tissue are unremarkable. No radiopaque foreign body or soft tissue emphysema. IMPRESSION: No acute osseous injury of the right shoulder. Electronically Signed   By: Kathreen Devoid M.D.   On: 05/23/2022 09:11   CT Head Wo Contrast  Result Date: 05/23/2022 CLINICAL DATA:  [place] 6 days ago. EXAM: CT HEAD WITHOUT CONTRAST CT MAXILLOFACIAL WITHOUT CONTRAST CT CERVICAL SPINE WITHOUT CONTRAST TECHNIQUE: Multidetector CT imaging of the head, cervical spine, and maxillofacial structures were performed using the standard protocol without intravenous contrast. Multiplanar CT image reconstructions of the cervical spine and maxillofacial structures were also generated. RADIATION DOSE REDUCTION: This exam was performed according to the departmental dose-optimization program which includes automated exposure control, adjustment of the mA and/or kV according to patient size and/or use of iterative reconstruction technique. COMPARISON:  Head CT 04/27/2022. Facial CT and cervical spine CT 02/12/2022 FINDINGS: CT HEAD FINDINGS Brain: Stable age advanced cerebral atrophy, ventriculomegaly and periventricular white matter disease. No extra-axial fluid collections  are identified. No CT findings for acute hemispheric infarction or intracranial hemorrhage. No mass  lesions. The brainstem and cerebellum are normal. Vascular: Stable vascular calcifications. No aneurysm hyperdense vessels. Skull: No acute skull fracture or bone lesions. Left-sided facial bone fractures noted. Other: No scalp lesions or scalp hematoma. CT MAXILLOFACIAL FINDINGS Osseous: Comminuted and displaced fractures involving the anterior and posterolateral walls of left maxillary sinus. The sinus is filled with hematoma. Comminuted and displaced fracture of the left zygoma. The lateral orbital wall is fractured along with the orbital floor. No evidence of entrapment of the inferior rectus muscle. The orbital roof is intact and the medial orbital wall is intact. No acute nasal bone fracture. No right-sided facial bone fractures. The mandibular condyles are normally located. No mandible fracture. Orbits: Left lateral orbital wall fracture and left orbital floor fracture. The left globe is intact. No intraorbital hematoma. Sinuses: The left maxillary sinus is filled with hematoma. The other paranasal sinuses and mastoid air cells are clear. Soft tissues: No significant findings. CT CERVICAL SPINE FINDINGS Alignment: Normal Skull base and vertebrae: No acute fracture. No primary bone lesion or focal pathologic process. Stable sclerotic lesion in the C4 vertebral body. Remote T3 fracture. Soft tissues and spinal canal: No prevertebral fluid or swelling. No visible canal hematoma. Disc levels: Degenerative cervical spondylosis with multilevel disc disease and facet disease but no large disc protrusions or significant canal compromise. There is multilevel foraminal stenosis due to uncinate spurring and facet disease. Upper chest: The lung apices are grossly clear. Other: Severe age advanced bilateral carotid artery calcifications. IMPRESSION: 1. Stable age advanced cerebral atrophy, ventriculomegaly and  periventricular white matter disease. 2. No acute intracranial findings or skull fracture. 3. Comminuted and displaced fractures involving the anterior and posterolateral walls of the left maxillary sinus. 4. Comminuted and displaced fracture of the left zygoma. 5. Left lateral orbital wall fracture and left orbital floor fracture. No evidence of entrapment of the inferior rectus muscle. 6. No acute cervical spine fracture. 7. Degenerative cervical spondylosis with multilevel disc disease and facet disease but no large disc protrusions or significant canal compromise. 8. Severe age advanced bilateral carotid artery calcifications. Electronically Signed   By: Marijo Sanes M.D.   On: 05/23/2022 09:08   CT Cervical Spine Wo Contrast  Result Date: 05/23/2022 CLINICAL DATA:  [place] 6 days ago. EXAM: CT HEAD WITHOUT CONTRAST CT MAXILLOFACIAL WITHOUT CONTRAST CT CERVICAL SPINE WITHOUT CONTRAST TECHNIQUE: Multidetector CT imaging of the head, cervical spine, and maxillofacial structures were performed using the standard protocol without intravenous contrast. Multiplanar CT image reconstructions of the cervical spine and maxillofacial structures were also generated. RADIATION DOSE REDUCTION: This exam was performed according to the departmental dose-optimization program which includes automated exposure control, adjustment of the mA and/or kV according to patient size and/or use of iterative reconstruction technique. COMPARISON:  Head CT 04/27/2022. Facial CT and cervical spine CT 02/12/2022 FINDINGS: CT HEAD FINDINGS Brain: Stable age advanced cerebral atrophy, ventriculomegaly and periventricular white matter disease. No extra-axial fluid collections are identified. No CT findings for acute hemispheric infarction or intracranial hemorrhage. No mass lesions. The brainstem and cerebellum are normal. Vascular: Stable vascular calcifications. No aneurysm hyperdense vessels. Skull: No acute skull fracture or bone lesions.  Left-sided facial bone fractures noted. Other: No scalp lesions or scalp hematoma. CT MAXILLOFACIAL FINDINGS Osseous: Comminuted and displaced fractures involving the anterior and posterolateral walls of left maxillary sinus. The sinus is filled with hematoma. Comminuted and displaced fracture of the left zygoma. The lateral orbital wall is fractured along with the orbital floor. No evidence of  entrapment of the inferior rectus muscle. The orbital roof is intact and the medial orbital wall is intact. No acute nasal bone fracture. No right-sided facial bone fractures. The mandibular condyles are normally located. No mandible fracture. Orbits: Left lateral orbital wall fracture and left orbital floor fracture. The left globe is intact. No intraorbital hematoma. Sinuses: The left maxillary sinus is filled with hematoma. The other paranasal sinuses and mastoid air cells are clear. Soft tissues: No significant findings. CT CERVICAL SPINE FINDINGS Alignment: Normal Skull base and vertebrae: No acute fracture. No primary bone lesion or focal pathologic process. Stable sclerotic lesion in the C4 vertebral body. Remote T3 fracture. Soft tissues and spinal canal: No prevertebral fluid or swelling. No visible canal hematoma. Disc levels: Degenerative cervical spondylosis with multilevel disc disease and facet disease but no large disc protrusions or significant canal compromise. There is multilevel foraminal stenosis due to uncinate spurring and facet disease. Upper chest: The lung apices are grossly clear. Other: Severe age advanced bilateral carotid artery calcifications. IMPRESSION: 1. Stable age advanced cerebral atrophy, ventriculomegaly and periventricular white matter disease. 2. No acute intracranial findings or skull fracture. 3. Comminuted and displaced fractures involving the anterior and posterolateral walls of the left maxillary sinus. 4. Comminuted and displaced fracture of the left zygoma. 5. Left lateral  orbital wall fracture and left orbital floor fracture. No evidence of entrapment of the inferior rectus muscle. 6. No acute cervical spine fracture. 7. Degenerative cervical spondylosis with multilevel disc disease and facet disease but no large disc protrusions or significant canal compromise. 8. Severe age advanced bilateral carotid artery calcifications. Electronically Signed   By: Marijo Sanes M.D.   On: 05/23/2022 09:08   CT Maxillofacial WO CM  Result Date: 05/23/2022 CLINICAL DATA:  [place] 6 days ago. EXAM: CT HEAD WITHOUT CONTRAST CT MAXILLOFACIAL WITHOUT CONTRAST CT CERVICAL SPINE WITHOUT CONTRAST TECHNIQUE: Multidetector CT imaging of the head, cervical spine, and maxillofacial structures were performed using the standard protocol without intravenous contrast. Multiplanar CT image reconstructions of the cervical spine and maxillofacial structures were also generated. RADIATION DOSE REDUCTION: This exam was performed according to the departmental dose-optimization program which includes automated exposure control, adjustment of the mA and/or kV according to patient size and/or use of iterative reconstruction technique. COMPARISON:  Head CT 04/27/2022. Facial CT and cervical spine CT 02/12/2022 FINDINGS: CT HEAD FINDINGS Brain: Stable age advanced cerebral atrophy, ventriculomegaly and periventricular white matter disease. No extra-axial fluid collections are identified. No CT findings for acute hemispheric infarction or intracranial hemorrhage. No mass lesions. The brainstem and cerebellum are normal. Vascular: Stable vascular calcifications. No aneurysm hyperdense vessels. Skull: No acute skull fracture or bone lesions. Left-sided facial bone fractures noted. Other: No scalp lesions or scalp hematoma. CT MAXILLOFACIAL FINDINGS Osseous: Comminuted and displaced fractures involving the anterior and posterolateral walls of left maxillary sinus. The sinus is filled with hematoma. Comminuted and displaced  fracture of the left zygoma. The lateral orbital wall is fractured along with the orbital floor. No evidence of entrapment of the inferior rectus muscle. The orbital roof is intact and the medial orbital wall is intact. No acute nasal bone fracture. No right-sided facial bone fractures. The mandibular condyles are normally located. No mandible fracture. Orbits: Left lateral orbital wall fracture and left orbital floor fracture. The left globe is intact. No intraorbital hematoma. Sinuses: The left maxillary sinus is filled with hematoma. The other paranasal sinuses and mastoid air cells are clear. Soft tissues: No significant findings. CT CERVICAL  SPINE FINDINGS Alignment: Normal Skull base and vertebrae: No acute fracture. No primary bone lesion or focal pathologic process. Stable sclerotic lesion in the C4 vertebral body. Remote T3 fracture. Soft tissues and spinal canal: No prevertebral fluid or swelling. No visible canal hematoma. Disc levels: Degenerative cervical spondylosis with multilevel disc disease and facet disease but no large disc protrusions or significant canal compromise. There is multilevel foraminal stenosis due to uncinate spurring and facet disease. Upper chest: The lung apices are grossly clear. Other: Severe age advanced bilateral carotid artery calcifications. IMPRESSION: 1. Stable age advanced cerebral atrophy, ventriculomegaly and periventricular white matter disease. 2. No acute intracranial findings or skull fracture. 3. Comminuted and displaced fractures involving the anterior and posterolateral walls of the left maxillary sinus. 4. Comminuted and displaced fracture of the left zygoma. 5. Left lateral orbital wall fracture and left orbital floor fracture. No evidence of entrapment of the inferior rectus muscle. 6. No acute cervical spine fracture. 7. Degenerative cervical spondylosis with multilevel disc disease and facet disease but no large disc protrusions or significant canal  compromise. 8. Severe age advanced bilateral carotid artery calcifications. Electronically Signed   By: Marijo Sanes M.D.   On: 05/23/2022 09:08      Impression / Plan:   Stephanie Skinner is a 66 y.o. female with alcohol dependence, benzodiazepine dependence, hypertension is admitted with severe symptomatic anemia resulting in frequent falls, loss of balance, sustained multiple facial fractures including left maxillary sinus, comminuted and displaced fracture of the left zygoma  Acute blood loss anemia, likely multifactorial combination of malnutrition, erosive esophagitis, peptic ulcer disease, alcoholic gastritis Continue pantoprazole drip, recommend long-term Protonix 40 mg p.o. twice daily given alcohol abuse Patient is hemodynamically stable and responded appropriately to blood transfusion Monitor CBC closely, maintain 2 large-bore IVs Patient does not have cirrhosis Given multiple facial fractures, will have to check with anesthesia about feasibility of performing upper endoscopy safely There is no urgent indication for upper endoscopy at this time Start full liquid diet Multivitamin plus thiamine plus folic acid daily  Thank you for involving me in the care of this patient.      LOS: 0 days   Sherri Sear, MD  05/23/2022, 10:27 PM    Note: This dictation was prepared with Dragon dictation along with smaller phrase technology. Any transcriptional errors that result from this process are unintentional.

## 2022-05-23 NOTE — ED Notes (Signed)
Admission MD made aware until 3rd peripheral IV obtained D5NS will be held until Protonix and/or blood transfusions are finished.

## 2022-05-23 NOTE — ED Notes (Signed)
Admission MD aware family at bedside and that pt requesting to speak with MD

## 2022-05-23 NOTE — ED Provider Notes (Signed)
[facility] Provider Note   Event Date/Time   First MD Initiated Contact with Patient 05/23/22 0800     (approximate) History  Fall  HPI Stephanie Skinner is a 66 y.o. female with a past medical history of EtOH abuse who presents via EMS after multiple falls over the past week including what she states is 3 falls within the past 24 hours.  Patient has multiple complaints but mostly of facial pain to the left aspect of her face as well as generalized weakness that has caused these falls over the past week. ROS: Patient currently denies any vision changes, tinnitus, difficulty speaking, facial droop, sore throat, chest pain, shortness of breath, abdominal pain, nausea/vomiting/diarrhea, dysuria, or numbness/paresthesias in any extremity   Physical Exam  Triage Vital Signs: ED Triage Vitals  Enc Vitals Group     BP      Pulse      Resp      Temp      Temp src      SpO2      Weight      Height      Head Circumference      Peak Flow      Pain Score      Pain Loc      Pain Edu?      Excl. in GC?    Most recent vital signs: Vitals:   05/23/22 1400 05/23/22 1415  BP: (!) 105/47   Pulse: 78 82  Resp: (!) 8 (!) 9  Temp:    SpO2: 97% 98%   General: Awake, oriented x4. CV:  Good peripheral perfusion.  Resp:  Normal effort.  Abd:  No distention.  Other:  Elderly Caucasian female laying in bed in no acute distress.  Ecchymosis over the left face with tenderness to palpation ED Results / Procedures / Treatments  Labs (all labs ordered are listed, but only abnormal results are displayed) Labs Reviewed  COMPREHENSIVE METABOLIC PANEL - Abnormal; Notable for the following components:      Result Value   Glucose, Bld 119 (*)    Calcium 8.1 (*)    Total Protein 6.4 (*)    Albumin 3.1 (*)    Total Bilirubin 2.1 (*)    All other components within normal limits  CBC WITH DIFFERENTIAL/PLATELET - Abnormal; Notable for the following components:   RBC 1.85 (*)     Hemoglobin 5.7 (*)    HCT 18.6 (*)    MCV 100.5 (*)    nRBC 0.5 (*)    All other components within normal limits  URINALYSIS, ROUTINE W REFLEX MICROSCOPIC - Abnormal; Notable for the following components:   Color, Urine AMBER (*)    APPearance HAZY (*)    Ketones, ur 20 (*)    All other components within normal limits  PROTIME-INR - Abnormal; Notable for the following components:   Prothrombin Time 15.6 (*)    INR 1.3 (*)    All other components within normal limits  BLOOD GAS, ARTERIAL - Abnormal; Notable for the following components:   pCO2 arterial 49 (*)    pO2, Arterial 81 (*)    Bicarbonate 29.0 (*)    Acid-Base Excess 3.0 (*)    All other components within normal limits  ETHANOL  AMMONIA  URINE DRUG SCREEN, QUALITATIVE ([facility] ONLY)  HEMOGLOBIN AND HEMATOCRIT, BLOOD  HEMOGLOBIN AND HEMATOCRIT, BLOOD  TYPE AND SCREEN  PREPARE RBC (CROSSMATCH)  ABO/RH   EKG ED ECG REPORT  I, Merwyn KatosEvan K Aron Needles, the attending physician, personally viewed and interpreted this ECG. Date: 05/23/2022 EKG Time: 0806 Rate: 97 Rhythm: normal sinus rhythm QRS Axis: normal Intervals: normal ST/T Wave abnormalities: normal Narrative Interpretation: no evidence of acute ischemia RADIOLOGY ED MD interpretation: X-ray of the right shoulder interpreted by me and shows no acute osseous injury  CT of the head without contrast interpreted by me shows no evidence of acute abnormalities including no intracerebral hemorrhage, obvious masses, or significant edema  CT of the cervical spine interpreted by me does not show any evidence of acute abnormalities including no acute fracture, malalignment, height loss, or dislocation  CT of the maxillofacial structures interpreted by me and shows comminuted and displaced fractures involving the anterior and posterior lateral walls of the left maxillary sinus, comminuted and displaced fractures of the left eye,, left lateral orbital wall fracture without  impingement -Agree with radiology assessment Official radiology report(s): DG Shoulder Right  Result Date: 05/23/2022 CLINICAL DATA:  Status post fall, severe pain EXAM: RIGHT SHOULDER - 2+ VIEW COMPARISON:  None Available. FINDINGS: Generalized osteopenia. No acute fracture or dislocation. No aggressive osseous lesion. Normal alignment. Soft tissue are unremarkable. No radiopaque foreign body or soft tissue emphysema. IMPRESSION: No acute osseous injury of the right shoulder. Electronically Signed   By: Elige KoHetal  Patel M.D.   On: 05/23/2022 09:11   CT Head Wo Contrast  Result Date: 05/23/2022 CLINICAL DATA:  Larey SeatFell 6 days ago. EXAM: CT HEAD WITHOUT CONTRAST CT MAXILLOFACIAL WITHOUT CONTRAST CT CERVICAL SPINE WITHOUT CONTRAST TECHNIQUE: Multidetector CT imaging of the head, cervical spine, and maxillofacial structures were performed using the standard protocol without intravenous contrast. Multiplanar CT image reconstructions of the cervical spine and maxillofacial structures were also generated. RADIATION DOSE REDUCTION: This exam was performed according to the departmental dose-optimization program which includes automated exposure control, adjustment of the mA and/or kV according to patient size and/or use of iterative reconstruction technique. COMPARISON:  Head CT 04/27/2022. Facial CT and cervical spine CT 02/12/2022 FINDINGS: CT HEAD FINDINGS Brain: Stable age advanced cerebral atrophy, ventriculomegaly and periventricular white matter disease. No extra-axial fluid collections are identified. No CT findings for acute hemispheric infarction or intracranial hemorrhage. No mass lesions. The brainstem and cerebellum are normal. Vascular: Stable vascular calcifications. No aneurysm hyperdense vessels. Skull: No acute skull fracture or bone lesions. Left-sided facial bone fractures noted. Other: No scalp lesions or scalp hematoma. CT MAXILLOFACIAL FINDINGS Osseous: Comminuted and displaced fractures involving  the anterior and posterolateral walls of left maxillary sinus. The sinus is filled with hematoma. Comminuted and displaced fracture of the left zygoma. The lateral orbital wall is fractured along with the orbital floor. No evidence of entrapment of the inferior rectus muscle. The orbital roof is intact and the medial orbital wall is intact. No acute nasal bone fracture. No right-sided facial bone fractures. The mandibular condyles are normally located. No mandible fracture. Orbits: Left lateral orbital wall fracture and left orbital floor fracture. The left globe is intact. No intraorbital hematoma. Sinuses: The left maxillary sinus is filled with hematoma. The other paranasal sinuses and mastoid air cells are clear. Soft tissues: No significant findings. CT CERVICAL SPINE FINDINGS Alignment: Normal Skull base and vertebrae: No acute fracture. No primary bone lesion or focal pathologic process. Stable sclerotic lesion in the C4 vertebral body. Remote T3 fracture. Soft tissues and spinal canal: No prevertebral fluid or swelling. No visible canal hematoma. Disc levels: Degenerative cervical spondylosis with multilevel disc disease and facet disease but  no large disc protrusions or significant canal compromise. There is multilevel foraminal stenosis due to uncinate spurring and facet disease. Upper chest: The lung apices are grossly clear. Other: Severe age advanced bilateral carotid artery calcifications. IMPRESSION: 1. Stable age advanced cerebral atrophy, ventriculomegaly and periventricular white matter disease. 2. No acute intracranial findings or skull fracture. 3. Comminuted and displaced fractures involving the anterior and posterolateral walls of the left maxillary sinus. 4. Comminuted and displaced fracture of the left zygoma. 5. Left lateral orbital wall fracture and left orbital floor fracture. No evidence of entrapment of the inferior rectus muscle. 6. No acute cervical spine fracture. 7. Degenerative  cervical spondylosis with multilevel disc disease and facet disease but no large disc protrusions or significant canal compromise. 8. Severe age advanced bilateral carotid artery calcifications. Electronically Signed   By: Rudie Meyer M.D.   On: 05/23/2022 09:08   CT Cervical Spine Wo Contrast  Result Date: 05/23/2022 CLINICAL DATA:  Larey Seat 6 days ago. EXAM: CT HEAD WITHOUT CONTRAST CT MAXILLOFACIAL WITHOUT CONTRAST CT CERVICAL SPINE WITHOUT CONTRAST TECHNIQUE: Multidetector CT imaging of the head, cervical spine, and maxillofacial structures were performed using the standard protocol without intravenous contrast. Multiplanar CT image reconstructions of the cervical spine and maxillofacial structures were also generated. RADIATION DOSE REDUCTION: This exam was performed according to the departmental dose-optimization program which includes automated exposure control, adjustment of the mA and/or kV according to patient size and/or use of iterative reconstruction technique. COMPARISON:  Head CT 04/27/2022. Facial CT and cervical spine CT 02/12/2022 FINDINGS: CT HEAD FINDINGS Brain: Stable age advanced cerebral atrophy, ventriculomegaly and periventricular white matter disease. No extra-axial fluid collections are identified. No CT findings for acute hemispheric infarction or intracranial hemorrhage. No mass lesions. The brainstem and cerebellum are normal. Vascular: Stable vascular calcifications. No aneurysm hyperdense vessels. Skull: No acute skull fracture or bone lesions. Left-sided facial bone fractures noted. Other: No scalp lesions or scalp hematoma. CT MAXILLOFACIAL FINDINGS Osseous: Comminuted and displaced fractures involving the anterior and posterolateral walls of left maxillary sinus. The sinus is filled with hematoma. Comminuted and displaced fracture of the left zygoma. The lateral orbital wall is fractured along with the orbital floor. No evidence of entrapment of the inferior rectus muscle. The  orbital roof is intact and the medial orbital wall is intact. No acute nasal bone fracture. No right-sided facial bone fractures. The mandibular condyles are normally located. No mandible fracture. Orbits: Left lateral orbital wall fracture and left orbital floor fracture. The left globe is intact. No intraorbital hematoma. Sinuses: The left maxillary sinus is filled with hematoma. The other paranasal sinuses and mastoid air cells are clear. Soft tissues: No significant findings. CT CERVICAL SPINE FINDINGS Alignment: Normal Skull base and vertebrae: No acute fracture. No primary bone lesion or focal pathologic process. Stable sclerotic lesion in the C4 vertebral body. Remote T3 fracture. Soft tissues and spinal canal: No prevertebral fluid or swelling. No visible canal hematoma. Disc levels: Degenerative cervical spondylosis with multilevel disc disease and facet disease but no large disc protrusions or significant canal compromise. There is multilevel foraminal stenosis due to uncinate spurring and facet disease. Upper chest: The lung apices are grossly clear. Other: Severe age advanced bilateral carotid artery calcifications. IMPRESSION: 1. Stable age advanced cerebral atrophy, ventriculomegaly and periventricular white matter disease. 2. No acute intracranial findings or skull fracture. 3. Comminuted and displaced fractures involving the anterior and posterolateral walls of the left maxillary sinus. 4. Comminuted and displaced fracture of the left  zygoma. 5. Left lateral orbital wall fracture and left orbital floor fracture. No evidence of entrapment of the inferior rectus muscle. 6. No acute cervical spine fracture. 7. Degenerative cervical spondylosis with multilevel disc disease and facet disease but no large disc protrusions or significant canal compromise. 8. Severe age advanced bilateral carotid artery calcifications. Electronically Signed   By: Rudie Meyer M.D.   On: 05/23/2022 09:08   CT Maxillofacial  WO CM  Result Date: 05/23/2022 CLINICAL DATA:  Larey Seat 6 days ago. EXAM: CT HEAD WITHOUT CONTRAST CT MAXILLOFACIAL WITHOUT CONTRAST CT CERVICAL SPINE WITHOUT CONTRAST TECHNIQUE: Multidetector CT imaging of the head, cervical spine, and maxillofacial structures were performed using the standard protocol without intravenous contrast. Multiplanar CT image reconstructions of the cervical spine and maxillofacial structures were also generated. RADIATION DOSE REDUCTION: This exam was performed according to the departmental dose-optimization program which includes automated exposure control, adjustment of the mA and/or kV according to patient size and/or use of iterative reconstruction technique. COMPARISON:  Head CT 04/27/2022. Facial CT and cervical spine CT 02/12/2022 FINDINGS: CT HEAD FINDINGS Brain: Stable age advanced cerebral atrophy, ventriculomegaly and periventricular white matter disease. No extra-axial fluid collections are identified. No CT findings for acute hemispheric infarction or intracranial hemorrhage. No mass lesions. The brainstem and cerebellum are normal. Vascular: Stable vascular calcifications. No aneurysm hyperdense vessels. Skull: No acute skull fracture or bone lesions. Left-sided facial bone fractures noted. Other: No scalp lesions or scalp hematoma. CT MAXILLOFACIAL FINDINGS Osseous: Comminuted and displaced fractures involving the anterior and posterolateral walls of left maxillary sinus. The sinus is filled with hematoma. Comminuted and displaced fracture of the left zygoma. The lateral orbital wall is fractured along with the orbital floor. No evidence of entrapment of the inferior rectus muscle. The orbital roof is intact and the medial orbital wall is intact. No acute nasal bone fracture. No right-sided facial bone fractures. The mandibular condyles are normally located. No mandible fracture. Orbits: Left lateral orbital wall fracture and left orbital floor fracture. The left globe is  intact. No intraorbital hematoma. Sinuses: The left maxillary sinus is filled with hematoma. The other paranasal sinuses and mastoid air cells are clear. Soft tissues: No significant findings. CT CERVICAL SPINE FINDINGS Alignment: Normal Skull base and vertebrae: No acute fracture. No primary bone lesion or focal pathologic process. Stable sclerotic lesion in the C4 vertebral body. Remote T3 fracture. Soft tissues and spinal canal: No prevertebral fluid or swelling. No visible canal hematoma. Disc levels: Degenerative cervical spondylosis with multilevel disc disease and facet disease but no large disc protrusions or significant canal compromise. There is multilevel foraminal stenosis due to uncinate spurring and facet disease. Upper chest: The lung apices are grossly clear. Other: Severe age advanced bilateral carotid artery calcifications. IMPRESSION: 1. Stable age advanced cerebral atrophy, ventriculomegaly and periventricular white matter disease. 2. No acute intracranial findings or skull fracture. 3. Comminuted and displaced fractures involving the anterior and posterolateral walls of the left maxillary sinus. 4. Comminuted and displaced fracture of the left zygoma. 5. Left lateral orbital wall fracture and left orbital floor fracture. No evidence of entrapment of the inferior rectus muscle. 6. No acute cervical spine fracture. 7. Degenerative cervical spondylosis with multilevel disc disease and facet disease but no large disc protrusions or significant canal compromise. 8. Severe age advanced bilateral carotid artery calcifications. Electronically Signed   By: Rudie Meyer M.D.   On: 05/23/2022 09:08   PROCEDURES: Critical Care performed: No .1-3 Lead EKG Interpretation  Performed  by: Naaman Plummer, MD Authorized by: Naaman Plummer, MD     Interpretation: normal     ECG rate:  81   ECG rate assessment: normal     Rhythm: sinus rhythm     Ectopy: none     Conduction: normal    MEDICATIONS  ORDERED IN ED: Medications  0.9 %  sodium chloride infusion (0 mL/hr Intravenous Hold 05/23/22 1103)  pantoprozole (PROTONIX) 80 mg /NS 100 mL infusion (8 mg/hr Intravenous Restarted 05/23/22 1307)  pantoprazole (PROTONIX) injection 40 mg (has no administration in time range)  ondansetron (ZOFRAN) tablet 4 mg (has no administration in time range)    Or  ondansetron (ZOFRAN) injection 4 mg (has no administration in time range)  LORazepam (ATIVAN) tablet 1-4 mg (has no administration in time range)    Or  LORazepam (ATIVAN) injection 1-4 mg (has no administration in time range)  thiamine (VITAMIN B1) tablet 100 mg ( Oral See Alternative 05/23/22 1224)    Or  thiamine (VITAMIN B1) injection 100 mg (100 mg Intravenous Given 12/26/75 4128)  folic acid (FOLVITE) tablet 1 mg (1 mg Oral Not Given 05/23/22 1216)  multivitamin with minerals tablet 1 tablet (1 tablet Oral Not Given 05/23/22 1216)  LORazepam (ATIVAN) injection 0-4 mg ( Intravenous Not Given 05/23/22 1213)    Followed by  LORazepam (ATIVAN) injection 0-4 mg (has no administration in time range)  dextrose 5 %-0.9 % sodium chloride infusion (0 mLs Intravenous Hold 05/23/22 1223)  sodium chloride 0.9 % bolus 1,000 mL (0 mLs Intravenous Stopped 05/23/22 1103)  ketorolac (TORADOL) 30 MG/ML injection 30 mg (30 mg Intravenous Given 05/23/22 0826)  pantoprazole (PROTONIX) 80 mg /NS 100 mL IVPB (0 mg Intravenous Stopped 05/23/22 1307)   IMPRESSION / MDM / ASSESSMENT AND PLAN / ED COURSE  I reviewed the triage vital signs and the nursing notes.                             The patient is on the cardiac monitor to evaluate for evidence of arrhythmia and/or significant heart rate changes. Patient's presentation is most consistent with acute presentation with potential threat to life or bodily function. + Hemoccult stool Given history and exam patients presentation consistent with possible upper GI bleed possibly secondary to peptic ulcer disease or  variceal bleeding. I have low suspicion for aortoenteric fistula, ENT bleeding mimic, [place], Pulmonary bleeding mimic.  Workup: CBC, BMP, LFTs, Lipase, PT/INR, Type and Screen  Interventions: Analgesia and antiemetic medications PRN Protonix 40mg  IVP PRBC transfusion  Findings: Hb: 5.7  Disposition: Admit for close monitoring.   FINAL CLINICAL IMPRESSION(S) / ED DIAGNOSES   Final diagnoses:  Symptomatic anemia  Closed fracture of left zygomatic arch, initial encounter ([place])  Fracture of left orbital wall ([place])   Rx / DC Orders   ED Discharge Orders     None      Note:  This document was prepared using Dragon voice recognition software and may include unintentional dictation errors.   Naaman Plummer, MD 05/23/22 [phone]

## 2022-05-23 NOTE — ED Notes (Signed)
Request made for transport to the floor ?

## 2022-05-23 NOTE — ED Notes (Signed)
Pt oxygen decreased to 85% on RA. Pt placed on 2L [place] at this time. MD made aware.

## 2022-05-23 NOTE — ED Notes (Signed)
RT Arrives to do R radial ABG stick

## 2022-05-23 NOTE — Assessment & Plan Note (Signed)
Place on CIWA protocol and administer lorazepam for CIWA score of 8 or greater

## 2022-05-23 NOTE — ED Notes (Signed)
Lab informed RN blood is ready. RN retrieving blood from blood bank at this time.

## 2022-05-23 NOTE — ED Triage Notes (Signed)
Pt to ED via [facility] from home for fall PT has a substantial HX of fall approx 20 this year. HX of etoh and other substance. Pt states 2-3 beers use xlast 3 days. PT fell X6 days ago and has a substantial bruise to left eye and face and neck. PT pt c/o R/ shoulder pain and bilat leg pain.

## 2022-05-23 NOTE — ED Notes (Signed)
RN aware bed assigned ?

## 2022-05-23 NOTE — ED Notes (Addendum)
Pt family updated via phone. Marland Kitchen [phone]

## 2022-05-23 NOTE — Assessment & Plan Note (Signed)
Patient presents to the ER for evaluation of a fall and noted to have a hemoglobin of 5.7, at her baseline of 12.4 She has had occasional black stools and admits to NSAID use. We will start patient on Protonix drip Transfused 2 units of packed RBC Check serial H&H Consult GI

## 2022-05-23 NOTE — ED Notes (Addendum)
Pt repsonsive to verbal stimuli. Upon awakening pt's speech more incomprehensible and garbled. Admission MD made aware.

## 2022-05-23 NOTE — Assessment & Plan Note (Signed)
Patient has a history of benzodiazepine dependence/abuse and was recently hospitalized for benzodiazepine withdrawal with delirium. She was seen by psychiatry during that hospitalization and was discharged on Klonopin and citalopram but according to her daughter she was started back on Xanax by her psychiatrist. Patient is at risk of going through withdrawal again. We will place on IV lorazepam as needed

## 2022-05-23 NOTE — ED Notes (Signed)
In and out cath done for urine sample. Pt tolerated well. Pt placed in brief with purewick.

## 2022-05-23 NOTE — Assessment & Plan Note (Signed)
Patient is currently normotensive Hold amlodipine and clonidine for now

## 2022-05-23 NOTE — ED Notes (Signed)
Provider notified of low hemoglobin (5.7) Pt at Radiology for CT and Xray at this time.

## 2022-05-24 ENCOUNTER — Inpatient Hospital Stay: Payer: Medicare Other | Admitting: Certified Registered Nurse Anesthetist

## 2022-05-24 ENCOUNTER — Encounter: Payer: Self-pay | Admitting: Internal Medicine

## 2022-05-24 ENCOUNTER — Encounter
Admission: EM | Discharge status: Against Medical Advice | Payer: Self-pay | Source: Home / Self Care | Attending: Osteopathic Medicine

## 2022-05-24 DIAGNOSIS — R195 Other fecal abnormalities: Secondary | ICD-10-CM

## 2022-05-24 DIAGNOSIS — D62 Acute posthemorrhagic anemia: Secondary | ICD-10-CM | POA: Diagnosis not present

## 2022-05-24 DIAGNOSIS — D509 Iron deficiency anemia, unspecified: Secondary | ICD-10-CM

## 2022-05-24 DIAGNOSIS — D649 Anemia, unspecified: Secondary | ICD-10-CM | POA: Diagnosis not present

## 2022-05-24 DIAGNOSIS — E440 Moderate protein-calorie malnutrition: Secondary | ICD-10-CM | POA: Insufficient documentation

## 2022-05-24 HISTORY — DX: Anemia, unspecified: D64.9

## 2022-05-24 HISTORY — PX: ESOPHAGOGASTRODUODENOSCOPY (EGD) WITH PROPOFOL: SHX5813

## 2022-05-24 LAB — BASIC METABOLIC PANEL
Anion gap: 2 — ABNORMAL LOW (ref 5–15)
BUN: 10 mg/dL (ref 8–23)
CO2: 27 mmol/L (ref 22–32)
Calcium: 7.8 mg/dL — ABNORMAL LOW (ref 8.9–10.3)
Chloride: 111 mmol/L (ref 98–111)
Creatinine, Ser: 0.48 mg/dL (ref 0.44–1.00)
GFR, Estimated: >60 mL/min (ref >60)
Glucose, Bld: 118 mg/dL — ABNORMAL HIGH (ref 70–99)
Potassium: 3.8 mmol/L (ref 3.5–5.1)
Sodium: 140 mmol/L (ref 135–145)

## 2022-05-24 LAB — FOLATE: Folate: 15.4 ng/mL (ref >5.9)

## 2022-05-24 LAB — TYPE AND SCREEN
ABO/RH(D): B POS
Antibody Screen: NEGATIVE
Unit division: 0
Unit division: 0

## 2022-05-24 LAB — BPAM RBC
Blood Product Expiration Date: 202310072359
Blood Product Expiration Date: 202310252359
ISSUE DATE / TIME: 202309281046
ISSUE DATE / TIME: 202309281326
Unit Type and Rh: 1700
Unit Type and Rh: 7300

## 2022-05-24 LAB — HEMOGLOBIN AND HEMATOCRIT, BLOOD
HCT: 25.4 % — ABNORMAL LOW (ref 36.0–46.0)
HCT: 29.9 % — ABNORMAL LOW (ref 36.0–46.0)
Hemoglobin: 8.3 g/dL — ABNORMAL LOW (ref 12.0–15.0)
Hemoglobin: 9.6 g/dL — ABNORMAL LOW (ref 12.0–15.0)

## 2022-05-24 LAB — VITAMIN B12: Vitamin B-12: 1272 pg/mL — ABNORMAL HIGH (ref 180–914)

## 2022-05-24 SURGERY — ESOPHAGOGASTRODUODENOSCOPY (EGD) WITH PROPOFOL
Anesthesia: General

## 2022-05-24 MED ORDER — LORAZEPAM 2 MG/ML IJ SOLN
1.0000 mg | INTRAMUSCULAR | Status: DC | PRN
  Administered 2022-05-24 – 2022-05-27 (×6): 1 mg via INTRAMUSCULAR
  Filled 2022-05-24 (×5): qty 1

## 2022-05-24 MED ORDER — LIDOCAINE HCL (CARDIAC) PF 100 MG/5ML IV SOSY
PREFILLED_SYRINGE | INTRAVENOUS | Status: DC | PRN
  Administered 2022-05-24: 100 mg via INTRAVENOUS

## 2022-05-24 MED ORDER — SODIUM CHLORIDE 0.9 % IV SOLN: INTRAVENOUS | Status: DC

## 2022-05-24 MED ORDER — ENSURE ENLIVE PO LIQD
237.0000 mL | Freq: Three times a day (TID) | ORAL | Status: DC
  Administered 2022-05-25 – 2022-06-01 (×13): 237 mL via ORAL

## 2022-05-24 MED ORDER — PROPOFOL 10 MG/ML IV BOLUS
INTRAVENOUS | Status: DC | PRN
  Administered 2022-05-24: 50 mg via INTRAVENOUS

## 2022-05-24 MED ORDER — PROPOFOL 500 MG/50ML IV EMUL
INTRAVENOUS | Status: DC | PRN
  Administered 2022-05-24: 150 ug/kg/min via INTRAVENOUS

## 2022-05-24 NOTE — Progress Notes (Signed)
       CROSS COVER NOTE  NAME: Stephanie Skinner MRN: [id number] DOB : 02/16/1956    HPI/Events of Note   Nurse requesting soft wrist restraints as patient with acute withdrawal delirium, no IV access that she has pulled out a few lines, and no available sitters   Chart reviewed.  Assessment and  Interventions   Assessment: Benzodiazepine and alcohol withdrawal delirium  Plan: Soft wrist restraint ordered X X

## 2022-05-24 NOTE — Progress Notes (Signed)
Patient admitted to the unit and placed on telemetry. A skin assessment was completed by myself and [facility] manager. The patient has bruising on the left side of her face from the eye down to her throat. She also has large bruises all over her body including back, chest, ribs, arms, legs, buttock, stomach, and other various places.

## 2022-05-24 NOTE — Plan of Care (Signed)
  Problem: Education: Goal: Knowledge of General Education information will improve Description: Including pain rating scale, medication(s)/side effects and non-pharmacologic comfort measures Outcome: Not Progressing   Problem: Health Behavior/Discharge Planning: Goal: Ability to manage health-related needs will improve Outcome: Not Progressing   

## 2022-05-24 NOTE — Progress Notes (Signed)
Initial Nutrition Assessment  DOCUMENTATION CODES:   Non-severe (moderate) malnutrition in context of acute illness/injury  INTERVENTION:   -Once diet is advanced, add:   -Ensure Enlive po TID, each supplement provides 350 kcal and 20 grams of protein -MVI with minerals daily  NUTRITION DIAGNOSIS:   Moderate Malnutrition related to acute illness (COVID-19) as evidenced by mild fat depletion, mild muscle depletion, moderate muscle depletion.  GOAL:   Patient will meet greater than or equal to 90% of their needs  MONITOR:   PO intake, Supplement acceptance, Diet advancement  REASON FOR ASSESSMENT:   Malnutrition Screening Tool    ASSESSMENT:   Pt with medical history significant for benzodiazepine dependence, alcohol dependence, hypertension, recent hospital discharge on 05/03/22 following admission for acute metabolic encephalopathy secondary to alcohol and benzodiazepine withdrawal complicated by delirium and COVID-19 infection.  Pt admitted with ABLA and alcohol abuse.   Reviewed I/O's: +1.5 L x 24 hours  Pt NPO for possible endo per GI.   Per H&P, pt has become increasingly lethargic and has had frequent falls. She still drinks alcohol and her last drink was 3 days PTA.   Pt lying in bed at time of visit. Pt very drowsy and had garbled speech. Pt reports wanting to eat; informed her of reasoning for NPO status. Per pt, she had poor appetite PTA, but was unable to elaborate further.    Pt unsure if she has lost weight. Reviewed wt hx; pt has experienced a 3.6% wt loss over the past 3 months, which is not significant for time frame. Exam has worsened compared to exam earlier this month.   Pt would greatly benefit from addition of oral nutrition supplements once diet is advanced. Pt's nutritional status likely worsened seincdary to COVID-19 diagnosis.   Medications reviewed and include folic acid, ativan, thiamine, dextrose 5%-0.9% sodium chloride infusion @ 100 ml/hr,  and protonix.   Labs reviewed: CBGS: 99 (inpatient orders for glycemic control are none).    NUTRITION - FOCUSED PHYSICAL EXAM:  Flowsheet Row Most Recent Value  Orbital Region Mild depletion  Upper Arm Region Mild depletion  Thoracic and Lumbar Region Mild depletion  Buccal Region Mild depletion  Temple Region Moderate depletion  Clavicle Bone Region Mild depletion  Clavicle and Acromion Bone Region Mild depletion  Scapular Bone Region Mild depletion  Dorsal Hand Mild depletion  Patellar Region Moderate depletion  Anterior Thigh Region Moderate depletion  Posterior Calf Region Moderate depletion  Edema (RD Assessment) None  Hair Reviewed  Eyes Reviewed  Mouth Reviewed  Skin Reviewed  Nails Reviewed       Diet Order:   Diet Order             Diet NPO time specified Except for: Ice Chips, Sips with Meds  Diet effective 0500 tomorrow                   EDUCATION NEEDS:   No education needs have been identified at this time  Skin:  Skin Assessment: Reviewed RN Assessment  Last BM:  Unknown  Height:   Ht Readings from Last 1 Encounters:  05/23/22 5\' 6"  (1.676 m)    Weight:   Wt Readings from Last 1 Encounters:  05/23/22 61.2 kg    Ideal Body Weight:  59.1 kg  BMI:  Body mass index is 21.79 kg/m.  Estimated Nutritional Needs:   Kcal:  9702-6378  Protein:  90-105 grams  Fluid:  > 1.7 L    Litzi Binning W,  RD, LDN, [place] Registered Dietitian II Certified Diabetes Care and Education Specialist Please refer to [facility] for RD and/or RD on-call/weekend/after hours pager

## 2022-05-24 NOTE — Anesthesia Preprocedure Evaluation (Signed)
Anesthesia Evaluation  Patient identified by MRN, date of birth, ID band Patient awake    Reviewed: Allergy & Precautions, H&P , NPO status , Patient's Chart, lab work & pertinent test results, reviewed documented beta blocker date and time   Airway Mallampati: II   Neck ROM: full    Dental  (+) Poor Dentition   Pulmonary neg pulmonary ROS, Current Smoker,    Pulmonary exam normal        Cardiovascular Exercise Tolerance: Poor hypertension, On Medications negative cardio ROS Normal cardiovascular exam Rhythm:regular Rate:Normal     Neuro/Psych Seizures -, Well Controlled,  Anxiety negative psych ROS   GI/Hepatic negative GI ROS, (+) Hepatitis -  Endo/Other  negative endocrine ROS  Renal/GU negative Renal ROS  negative genitourinary   Musculoskeletal   Abdominal   Peds  Hematology  (+) Blood dyscrasia, anemia ,   Anesthesia Other Findings Past Medical History: 05/24/2022: Anemia No date: Arthritis No date: COVID-19 No date: ETOH abuse No date: Hepatitis C     Comment:  pt states she has been cured No date: Hypertension No date: Seizures ([place]) Past Surgical History: No date: ABDOMINAL HYSTERECTOMY     Comment:  "partial hysterectomy" No date: ELBOW SURGERY BMI    Body Mass Index: 21.79 kg/m     Reproductive/Obstetrics negative OB ROS                             Anesthesia Physical Anesthesia Plan  ASA: 3 and emergent  Anesthesia Plan: General   Post-op Pain Management:    Induction:   PONV Risk Score and Plan:   Airway Management Planned:   Additional Equipment:   Intra-op Plan:   Post-operative Plan:   Informed Consent: I have reviewed the patients History and Physical, chart, labs and discussed the procedure including the risks, benefits and alternatives for the proposed anesthesia with the patient or authorized representative who has indicated his/her  understanding and acceptance.     Dental Advisory Given  Plan Discussed with: CRNA  Anesthesia Plan Comments:         Anesthesia Quick Evaluation

## 2022-05-24 NOTE — Progress Notes (Signed)
Nursing staff making several trip to patient's room in response of bed alarm or centralized telemetry because she will not keep the monitor on. She has also pulled out both of her ivs. Attending has been notified via secure chat. There are no sitter resources available at this time.

## 2022-05-24 NOTE — Transfer of Care (Signed)
Immediate Anesthesia Transfer of Care Note  Patient: Stephanie Skinner  Procedure(s) Performed: ESOPHAGOGASTRODUODENOSCOPY (EGD) WITH PROPOFOL  Patient Location: PACU and Endoscopy Unit  Anesthesia Type:General  Level of Consciousness: awake  Airway & Oxygen Therapy: Patient Spontanous Breathing and Patient connected to nasal cannula oxygen  Post-op Assessment: Report given to RN and Post -op Vital signs reviewed and stable  Post vital signs: Reviewed and stable  Last Vitals:  Vitals Value Taken Time  BP 133/67 05/24/22 1454  Temp    Pulse 89 05/24/22 1455  Resp 17 05/24/22 1455  SpO2 92 % 05/24/22 1455  Vitals shown include unvalidated device data.  Last Pain:  Vitals:   05/24/22 1339  TempSrc:   PainSc: 2          Complications: No notable events documented.

## 2022-05-24 NOTE — Op Note (Signed)
[facility] Patient Name: Stephanie Skinner Procedure Date: 05/24/2022 2:34 PM MRN: [id number] Account #: [id number] Date of Birth: [birth year] Admit Type: Ambulatory Age: 66 Room: [facility] ENDO ROOM 2 Gender: Female Note Status: Finalized Instrument Name: Patton Salles Endoscope [id number] Procedure:             Upper GI endoscopy Indications:           Unexplained iron deficiency anemia, Occult blood in                         stool Providers:             Toney Reil MD, MD Referring MD:          Dow Adolph. Karleen Hampshire, MD (Referring MD) Medicines:             General Anesthesia Complications:         No immediate complications. Estimated blood loss: None. Procedure:             Pre-Anesthesia Assessment:                        - Prior to the procedure, a History and Physical was                         performed, and patient medications and allergies were                         reviewed. The patient is competent. The risks and                         benefits of the procedure and the sedation options and                         risks were discussed with the patient. All questions                         were answered and informed consent was obtained.                         Patient identification and proposed procedure were                         verified by the physician, the nurse, the                         anesthesiologist, the anesthetist and the technician                         in the pre-procedure area in the procedure room in the                         endoscopy suite. Mental Status Examination: alert and                         oriented. Airway Examination: normal oropharyngeal                         airway and neck mobility. Respiratory Examination:  clear to auscultation. CV Examination: normal.                         Prophylactic Antibiotics: The patient does not require                         prophylactic antibiotics.  Prior Anticoagulants: The                         patient has taken no previous anticoagulant or                         antiplatelet agents. ASA Grade Assessment: III - A                         patient with severe systemic disease. After reviewing                         the risks and benefits, the patient was deemed in                         satisfactory condition to undergo the procedure. The                         anesthesia plan was to use general anesthesia.                         Immediately prior to administration of medications,                         the patient was re-assessed for adequacy to receive                         sedatives. The heart rate, respiratory rate, oxygen                         saturations, blood pressure, adequacy of pulmonary                         ventilation, and response to care were monitored                         throughout the procedure. The physical status of the                         patient was re-assessed after the procedure.                        After obtaining informed consent, the endoscope was                         passed under direct vision. Throughout the procedure,                         the patient's blood pressure, pulse, and oxygen                         saturations were monitored continuously. The Endoscope  was introduced through the mouth, and advanced to the                         second part of duodenum. The upper GI endoscopy was                         accomplished without difficulty. The patient tolerated                         the procedure well. Findings:      The esophagus was normal.      The stomach was normal.      The examined duodenum was normal. Impression:            - Normal esophagus.                        - Normal stomach.                        - Normal examined duodenum.                        - No specimens collected. Recommendation:        - Return patient to hospital  ward for ongoing care.                        - Resume regular diet today.                        - Continue present medications. Procedure Code(s):     --- Professional ---                        [phone], Esophagogastroduodenoscopy, flexible,                         transoral; diagnostic, including collection of                         specimen(s) by brushing or washing, when performed                         (separate procedure) Diagnosis Code(s):     --- Professional ---                        D50.9, Iron deficiency anemia, unspecified                        R19.5, Other fecal abnormalities CPT copyright 2019 [employer]. All rights reserved. The codes documented in this report are preliminary and upon coder review may  be revised to meet current compliance requirements. Dr. Ulyess Mort Lin Landsman MD, MD 05/24/2022 2:49:03 PM This report has been signed electronically. Number of Addenda: 0 Note Initiated On: 05/24/2022 2:34 PM Estimated Blood Loss:  Estimated blood loss: none.      [facility]

## 2022-05-24 NOTE — Anesthesia Procedure Notes (Addendum)
Procedure Name: MAC Date/Time: 05/24/2022 2:39 PM  Performed by: Lily Peer, Summer, CRNAPre-anesthesia Checklist: Patient identified, Emergency Drugs available, Suction available, Patient being monitored and Timeout performed Patient Re-evaluated:Patient Re-evaluated prior to induction Oxygen Delivery Method: Nasal cannula Induction Type: IV induction Placement Confirmation: positive ETCO2 and CO2 detector

## 2022-05-24 NOTE — Progress Notes (Signed)
PROGRESS NOTE    Stephanie Skinner  [id number] DOB: [birth year] DOA: 05/23/2022 PCP: Laurell Josephs, MD    Brief Narrative:   Stephanie Skinner is a 66 y.o. female with medical history significant for benzodiazepine dependence, alcohol dependence, hypertension, recent hospital discharge on 05/03/22 following admission for acute metabolic encephalopathy secondary to alcohol and benzodiazepine withdrawal complicated by delirium and COVID-19 infection. Patient was discharged home in stable condition and according to her roommate she started taking the 'pills" again.  He notes that she has been increasingly lethargic and has had frequent falls.  He called EMS on the day of admission after she fell landing on her face.  She complained of pain in her right shoulder as well as in her legs.  She received a dose of Toradol in the ER and during my evaluation appears very lethargic and speech is not clear.  Per her roommate she still drinks alcohol and her last use was about 3 days ago. She complains of abdominal pain that is nonlocalized and is unable to rate the pain.  She states that her stools are sometimes black but denies any blood or any hematemesis.  She denies feeling dizzy or lightheaded and denies having any chest pain or shortness of breath.  She did is having any headache, no fever, no chills, no cough, no blurred vision or focal deficit. She notes that she has had frequent falls but is unable to tell me why. Labs show a hemoglobin of 5.7 compared to baseline of 12.4.  Per ER doc her stool is heme positive 2 units of packed RBC have been ordered She will be admitted to the hospital for further evaluation  9/29 s/p EGD, no acute findings. No further w/u per GI.    Consultants:  GI  Procedures: EGD  Antimicrobials:      Subjective: Pt groggy. Denies sob or abd pain. Although difficult to understand her as she is mumbling  Objective: Vitals:   05/24/22 1339 05/24/22 1454 05/24/22 1514  05/24/22 1524  BP: (!) 114/100 133/67 126/69 118/82  Pulse: 88     Resp: 16 15    Temp: (!) 96.6 F (35.9 C) 97.8 F (36.6 C)    TempSrc:  Temporal    SpO2: 96%     Weight:      Height:        Intake/Output Summary (Last 24 hours) at 05/24/2022 1708 Last data filed at 05/24/2022 1447 Gross per 24 hour  Intake 1779.81 ml  Output --  Net 1779.81 ml   Filed Weights   05/23/22 0811  Weight: 61.2 kg    Examination: Calm, NAD Cta no w/r Reg s1/s2 no gallop Soft benign +bs No edema Groggly/lethargic.  Mood and affect appropriate in current setting    Data Reviewed: I have personally reviewed following labs and imaging studies  CBC: Recent Labs  Lab 05/23/22 0815 05/23/22 1703 05/24/22 0139 05/24/22 1006  WBC 8.7  --   --   --   NEUTROABS 6.8  --   --   --   HGB 5.7* 8.1* 8.3* 9.6*  HCT 18.6* 24.9* 25.4* 29.9*  MCV 100.5*  --   --   --   PLT 234  --   --   --    Basic Metabolic Panel: Recent Labs  Lab 05/23/22 0815 05/24/22 0542  NA 135 140  K 4.3 3.8  CL 101 111  CO2 26 27  GLUCOSE 119* 118*  BUN 13 10  CREATININE 0.61 0.48  CALCIUM 8.1* 7.8*   GFR: Estimated Creatinine Clearance: 63.9 mL/min (by C-G formula based on SCr of 0.48 mg/dL). Liver Function Tests: Recent Labs  Lab 05/23/22 0815  AST 34  ALT 18  ALKPHOS 58  BILITOT 2.1*  PROT 6.4*  ALBUMIN 3.1*   No results for input(s): "LIPASE", "AMYLASE" in the last 168 hours. Recent Labs  Lab 05/23/22 1103  AMMONIA 15   Coagulation Profile: Recent Labs  Lab 05/23/22 1103  INR 1.3*   Cardiac Enzymes: No results for input(s): "CKTOTAL", "CKMB", "CKMBINDEX", "TROPONINI" in the last 168 hours. BNP (last 3 results) No results for input(s): "PROBNP" in the last 8760 hours. HbA1C: No results for input(s): "HGBA1C" in the last 72 hours. CBG: No results for input(s): "GLUCAP" in the last 168 hours. Lipid Profile: No results for input(s): "CHOL", "HDL", "LDLCALC", "TRIG", "CHOLHDL",  "LDLDIRECT" in the last 72 hours. Thyroid Function Tests: No results for input(s): "TSH", "T4TOTAL", "FREET4", "T3FREE", "THYROIDAB" in the last 72 hours. Anemia Panel: Recent Labs    05/24/22 1453  FOLATE 15.4   Sepsis Labs: No results for input(s): "PROCALCITON", "LATICACIDVEN" in the last 168 hours.  No results found for this or any previous visit (from the past 240 hour(s)).       Radiology Studies: US Abdomen Limited RUQ (LIVER/GB)  Result Date: 05/23/2022 CLINICAL DATA:  A 66 year old female presents with elevated liver enzymes. EXAM: ULTRASOUND ABDOMEN LIMITED RIGHT UPPER QUADRANT COMPARISON:  None Available. FINDINGS: Gallbladder: No gallstones or wall thickening visualized. No sonographic Murphy sign noted by sonographer. Common bile duct: Diameter: 5.5 mm Liver: No focal lesion identified. Mild parenchymal increased echogenicity. Portal vein is patent on color Doppler imaging with normal direction of blood flow towards the liver. Other: Limited study due to patient body habitus. Query RIGHT pleural effusion versus artifact. IMPRESSION: No acute biliary process. Limited evaluation of the liver with suggestion of mild hepatic steatosis. Query RIGHT-sided pleural effusion versus artifact. Electronically Signed   By: Zetta Bills M.D.   On: 05/23/2022 19:07   DG Shoulder Right  Result Date: 05/23/2022 CLINICAL DATA:  Status post fall, severe pain EXAM: RIGHT SHOULDER - 2+ VIEW COMPARISON:  None Available. FINDINGS: Generalized osteopenia. No acute fracture or dislocation. No aggressive osseous lesion. Normal alignment. Soft tissue are unremarkable. No radiopaque foreign body or soft tissue emphysema. IMPRESSION: No acute osseous injury of the right shoulder. Electronically Signed   By: Kathreen Devoid M.D.   On: 05/23/2022 09:11   CT Head Wo Contrast  Result Date: 05/23/2022 CLINICAL DATA:  [place] 6 days ago. EXAM: CT HEAD WITHOUT CONTRAST CT MAXILLOFACIAL WITHOUT CONTRAST CT CERVICAL  SPINE WITHOUT CONTRAST TECHNIQUE: Multidetector CT imaging of the head, cervical spine, and maxillofacial structures were performed using the standard protocol without intravenous contrast. Multiplanar CT image reconstructions of the cervical spine and maxillofacial structures were also generated. RADIATION DOSE REDUCTION: This exam was performed according to the departmental dose-optimization program which includes automated exposure control, adjustment of the mA and/or kV according to patient size and/or use of iterative reconstruction technique. COMPARISON:  Head CT 04/27/2022. Facial CT and cervical spine CT 02/12/2022 FINDINGS: CT HEAD FINDINGS Brain: Stable age advanced cerebral atrophy, ventriculomegaly and periventricular white matter disease. No extra-axial fluid collections are identified. No CT findings for acute hemispheric infarction or intracranial hemorrhage. No mass lesions. The brainstem and cerebellum are normal. Vascular: Stable vascular calcifications. No aneurysm hyperdense vessels. Skull: No acute skull fracture or bone lesions. Left-sided facial  bone fractures noted. Other: No scalp lesions or scalp hematoma. CT MAXILLOFACIAL FINDINGS Osseous: Comminuted and displaced fractures involving the anterior and posterolateral walls of left maxillary sinus. The sinus is filled with hematoma. Comminuted and displaced fracture of the left zygoma. The lateral orbital wall is fractured along with the orbital floor. No evidence of entrapment of the inferior rectus muscle. The orbital roof is intact and the medial orbital wall is intact. No acute nasal bone fracture. No right-sided facial bone fractures. The mandibular condyles are normally located. No mandible fracture. Orbits: Left lateral orbital wall fracture and left orbital floor fracture. The left globe is intact. No intraorbital hematoma. Sinuses: The left maxillary sinus is filled with hematoma. The other paranasal sinuses and mastoid air cells are  clear. Soft tissues: No significant findings. CT CERVICAL SPINE FINDINGS Alignment: Normal Skull base and vertebrae: No acute fracture. No primary bone lesion or focal pathologic process. Stable sclerotic lesion in the C4 vertebral body. Remote T3 fracture. Soft tissues and spinal canal: No prevertebral fluid or swelling. No visible canal hematoma. Disc levels: Degenerative cervical spondylosis with multilevel disc disease and facet disease but no large disc protrusions or significant canal compromise. There is multilevel foraminal stenosis due to uncinate spurring and facet disease. Upper chest: The lung apices are grossly clear. Other: Severe age advanced bilateral carotid artery calcifications. IMPRESSION: 1. Stable age advanced cerebral atrophy, ventriculomegaly and periventricular white matter disease. 2. No acute intracranial findings or skull fracture. 3. Comminuted and displaced fractures involving the anterior and posterolateral walls of the left maxillary sinus. 4. Comminuted and displaced fracture of the left zygoma. 5. Left lateral orbital wall fracture and left orbital floor fracture. No evidence of entrapment of the inferior rectus muscle. 6. No acute cervical spine fracture. 7. Degenerative cervical spondylosis with multilevel disc disease and facet disease but no large disc protrusions or significant canal compromise. 8. Severe age advanced bilateral carotid artery calcifications. Electronically Signed   By: Marijo Sanes M.D.   On: 05/23/2022 09:08   CT Cervical Spine Wo Contrast  Result Date: 05/23/2022 CLINICAL DATA:  [place] 6 days ago. EXAM: CT HEAD WITHOUT CONTRAST CT MAXILLOFACIAL WITHOUT CONTRAST CT CERVICAL SPINE WITHOUT CONTRAST TECHNIQUE: Multidetector CT imaging of the head, cervical spine, and maxillofacial structures were performed using the standard protocol without intravenous contrast. Multiplanar CT image reconstructions of the cervical spine and maxillofacial structures were also  generated. RADIATION DOSE REDUCTION: This exam was performed according to the departmental dose-optimization program which includes automated exposure control, adjustment of the mA and/or kV according to patient size and/or use of iterative reconstruction technique. COMPARISON:  Head CT 04/27/2022. Facial CT and cervical spine CT 02/12/2022 FINDINGS: CT HEAD FINDINGS Brain: Stable age advanced cerebral atrophy, ventriculomegaly and periventricular white matter disease. No extra-axial fluid collections are identified. No CT findings for acute hemispheric infarction or intracranial hemorrhage. No mass lesions. The brainstem and cerebellum are normal. Vascular: Stable vascular calcifications. No aneurysm hyperdense vessels. Skull: No acute skull fracture or bone lesions. Left-sided facial bone fractures noted. Other: No scalp lesions or scalp hematoma. CT MAXILLOFACIAL FINDINGS Osseous: Comminuted and displaced fractures involving the anterior and posterolateral walls of left maxillary sinus. The sinus is filled with hematoma. Comminuted and displaced fracture of the left zygoma. The lateral orbital wall is fractured along with the orbital floor. No evidence of entrapment of the inferior rectus muscle. The orbital roof is intact and the medial orbital wall is intact. No acute nasal bone fracture. No right-sided  facial bone fractures. The mandibular condyles are normally located. No mandible fracture. Orbits: Left lateral orbital wall fracture and left orbital floor fracture. The left globe is intact. No intraorbital hematoma. Sinuses: The left maxillary sinus is filled with hematoma. The other paranasal sinuses and mastoid air cells are clear. Soft tissues: No significant findings. CT CERVICAL SPINE FINDINGS Alignment: Normal Skull base and vertebrae: No acute fracture. No primary bone lesion or focal pathologic process. Stable sclerotic lesion in the C4 vertebral body. Remote T3 fracture. Soft tissues and spinal  canal: No prevertebral fluid or swelling. No visible canal hematoma. Disc levels: Degenerative cervical spondylosis with multilevel disc disease and facet disease but no large disc protrusions or significant canal compromise. There is multilevel foraminal stenosis due to uncinate spurring and facet disease. Upper chest: The lung apices are grossly clear. Other: Severe age advanced bilateral carotid artery calcifications. IMPRESSION: 1. Stable age advanced cerebral atrophy, ventriculomegaly and periventricular white matter disease. 2. No acute intracranial findings or skull fracture. 3. Comminuted and displaced fractures involving the anterior and posterolateral walls of the left maxillary sinus. 4. Comminuted and displaced fracture of the left zygoma. 5. Left lateral orbital wall fracture and left orbital floor fracture. No evidence of entrapment of the inferior rectus muscle. 6. No acute cervical spine fracture. 7. Degenerative cervical spondylosis with multilevel disc disease and facet disease but no large disc protrusions or significant canal compromise. 8. Severe age advanced bilateral carotid artery calcifications. Electronically Signed   By: Marijo Sanes M.D.   On: 05/23/2022 09:08   CT Maxillofacial WO CM  Result Date: 05/23/2022 CLINICAL DATA:  [place] 6 days ago. EXAM: CT HEAD WITHOUT CONTRAST CT MAXILLOFACIAL WITHOUT CONTRAST CT CERVICAL SPINE WITHOUT CONTRAST TECHNIQUE: Multidetector CT imaging of the head, cervical spine, and maxillofacial structures were performed using the standard protocol without intravenous contrast. Multiplanar CT image reconstructions of the cervical spine and maxillofacial structures were also generated. RADIATION DOSE REDUCTION: This exam was performed according to the departmental dose-optimization program which includes automated exposure control, adjustment of the mA and/or kV according to patient size and/or use of iterative reconstruction technique. COMPARISON:  Head CT  04/27/2022. Facial CT and cervical spine CT 02/12/2022 FINDINGS: CT HEAD FINDINGS Brain: Stable age advanced cerebral atrophy, ventriculomegaly and periventricular white matter disease. No extra-axial fluid collections are identified. No CT findings for acute hemispheric infarction or intracranial hemorrhage. No mass lesions. The brainstem and cerebellum are normal. Vascular: Stable vascular calcifications. No aneurysm hyperdense vessels. Skull: No acute skull fracture or bone lesions. Left-sided facial bone fractures noted. Other: No scalp lesions or scalp hematoma. CT MAXILLOFACIAL FINDINGS Osseous: Comminuted and displaced fractures involving the anterior and posterolateral walls of left maxillary sinus. The sinus is filled with hematoma. Comminuted and displaced fracture of the left zygoma. The lateral orbital wall is fractured along with the orbital floor. No evidence of entrapment of the inferior rectus muscle. The orbital roof is intact and the medial orbital wall is intact. No acute nasal bone fracture. No right-sided facial bone fractures. The mandibular condyles are normally located. No mandible fracture. Orbits: Left lateral orbital wall fracture and left orbital floor fracture. The left globe is intact. No intraorbital hematoma. Sinuses: The left maxillary sinus is filled with hematoma. The other paranasal sinuses and mastoid air cells are clear. Soft tissues: No significant findings. CT CERVICAL SPINE FINDINGS Alignment: Normal Skull base and vertebrae: No acute fracture. No primary bone lesion or focal pathologic process. Stable sclerotic lesion in the C4  vertebral body. Remote T3 fracture. Soft tissues and spinal canal: No prevertebral fluid or swelling. No visible canal hematoma. Disc levels: Degenerative cervical spondylosis with multilevel disc disease and facet disease but no large disc protrusions or significant canal compromise. There is multilevel foraminal stenosis due to uncinate spurring and  facet disease. Upper chest: The lung apices are grossly clear. Other: Severe age advanced bilateral carotid artery calcifications. IMPRESSION: 1. Stable age advanced cerebral atrophy, ventriculomegaly and periventricular white matter disease. 2. No acute intracranial findings or skull fracture. 3. Comminuted and displaced fractures involving the anterior and posterolateral walls of the left maxillary sinus. 4. Comminuted and displaced fracture of the left zygoma. 5. Left lateral orbital wall fracture and left orbital floor fracture. No evidence of entrapment of the inferior rectus muscle. 6. No acute cervical spine fracture. 7. Degenerative cervical spondylosis with multilevel disc disease and facet disease but no large disc protrusions or significant canal compromise. 8. Severe age advanced bilateral carotid artery calcifications. Electronically Signed   By: Marijo Sanes M.D.   On: 05/23/2022 09:08        Scheduled Meds:  [START ON 05/25/2022] feeding supplement  237 mL Oral TID BM   folic acid  1 mg Oral Daily   LORazepam  0-4 mg Intravenous Q6H   Followed by   Derrill Memo ON 05/25/2022] LORazepam  0-4 mg Intravenous Q12H   multivitamin with minerals  1 tablet Oral Daily   [START ON 05/27/2022] pantoprazole  40 mg Intravenous Q12H   thiamine  100 mg Oral Daily   Or   thiamine  100 mg Intravenous Daily   Continuous Infusions:  dextrose 5 % and 0.9% NaCl 100 mL/hr at 05/24/22 0829    Assessment & Plan:   Principal Problem:   ABLA (acute blood loss anemia) Active Problems:   Alcohol abuse   Essential hypertension   Benzodiazepine abuse (HCC)   Frequent falls   Malnutrition of moderate degree   ABLA (acute blood loss anemia) Patient presents to the ER for evaluation of a fall and noted to have a hemoglobin of 5.7, at her baseline of 12.4 She has had occasional black stools and admits to NSAID use. We will start patient on Protonix drip Transfused 2 units of packed RBC 9/29 s/p EGD  -unremarkable, Gi has no further w/u plans.  Can do cscope as outpt.  H/h remains stable.   Alcohol abuse Lethargic this am.  Continue on ciwa protocal. Folate and thiamine   Essential hypertension Currently stable. Hold bp meds   Frequent falls Patient has had several falls following her hospital discharge and was brought in for evaluation after a fall.   She is noted to have comminuted and displaced fractures involving the anterior and posterolateral walls of left maxillary sinus. The sinus is filled with hematoma.  She also has a comminuted and displaced fracture of the left zygoma. The lateral orbital wall is fractured along with the orbital floor. No evidence of entrapment of the inferior rectus muscle. The orbital roof is intact and the medial orbital wall is intact. Pain control Fall precautions 9/29 PT OT consult   Benzodiazepine abuse [facility]) Patient has a history of benzodiazepine dependence/abuse and was recently hospitalized for benzodiazepine withdrawal with delirium. She was seen by psychiatry during that hospitalization and was discharged on Klonopin and citalopram but according to her daughter she was started back on Xanax by her psychiatrist. Patient is at risk of going through withdrawal again. 9/29 IV lorazepam as needed  DVT prophylaxis: scd Code Status:full Family Communication: none at bedside Disposition Plan:  Status is: Inpatient Remains inpatient appropriate because: iv treatment        LOS: 1 day   Time spent: 53 min    Nolberto Hanlon, MD [facility] Pager [phone]  If 7PM-7AM, please contact night-coverage 05/24/2022, 5:08 PM

## 2022-05-25 DIAGNOSIS — D62 Acute posthemorrhagic anemia: Secondary | ICD-10-CM | POA: Diagnosis not present

## 2022-05-25 LAB — CBC
HCT: 27.1 % — ABNORMAL LOW (ref 36.0–46.0)
Hemoglobin: 8.7 g/dL — ABNORMAL LOW (ref 12.0–15.0)
MCH: 30.2 pg (ref 26.0–34.0)
MCHC: 32.1 g/dL (ref 30.0–36.0)
MCV: 94.1 fL (ref 80.0–100.0)
Platelets: 236 10*3/uL (ref 150–400)
RBC: 2.88 MIL/uL — ABNORMAL LOW (ref 3.87–5.11)
RDW: 18.8 % — ABNORMAL HIGH (ref 11.5–15.5)
WBC: 7.9 10*3/uL (ref 4.0–10.5)
nRBC: 0.4 % — ABNORMAL HIGH (ref 0.0–0.2)

## 2022-05-25 MED ORDER — PANTOPRAZOLE SODIUM 40 MG PO TBEC
40.0000 mg | DELAYED_RELEASE_TABLET | Freq: Two times a day (BID) | ORAL | Status: DC
  Administered 2022-05-25 – 2022-06-03 (×18): 40 mg via ORAL
  Filled 2022-05-25 (×17): qty 1

## 2022-05-25 NOTE — TOC Progression Note (Addendum)
Transition of Care [facility]) - Progression Note    Patient Details  Name: Stephanie Skinner MRN: [id number] Date of Birth: 05/06/1956  [facility] CM/SW Contact  Izola Price, RN Phone Number: 05/25/2022, 12:54 PM  Clinical Narrative:  9/30: [facility] consult for SA/ETOH resources/education. Patient is currently in active withdrawal and so resources/education placed in AVS summary for now. Will reach out to daughter. Simmie Davies RN CM           Expected Discharge Plan and Services                                                 Social Determinants of Health (SDOH) Interventions    Readmission Risk Interventions     No data to display

## 2022-05-25 NOTE — Progress Notes (Signed)
PROGRESS NOTE    Stephanie Skinner  UDJ:[id number] DOB: 1955-09-03 DOA: 05/23/2022 PCP: Laurell Josephs, MD    Brief Narrative:   Stephanie Skinner is a 66 y.o. female with medical history significant for benzodiazepine dependence, alcohol dependence, hypertension, recent hospital discharge on 05/03/22 following admission for acute metabolic encephalopathy secondary to alcohol and benzodiazepine withdrawal complicated by delirium and COVID-19 infection. Patient was discharged home in stable condition and according to her roommate she started taking the 'pills" again.  He notes that she has been increasingly lethargic and has had frequent falls.  He called EMS on the day of admission after she fell landing on her face.  She complained of pain in her right shoulder as well as in her legs.  She received a dose of Toradol in the ER and during my evaluation appears very lethargic and speech is not clear.  Per her roommate she still drinks alcohol and her last use was about 3 days ago. She complains of abdominal pain that is nonlocalized and is unable to rate the pain.  She states that her stools are sometimes black but denies any blood or any hematemesis.  She denies feeling dizzy or lightheaded and denies having any chest pain or shortness of breath.  She did is having any headache, no fever, no chills, no cough, no blurred vision or focal deficit. She notes that she has had frequent falls but is unable to tell me why. Labs show a hemoglobin of 5.7 compared to baseline of 12.4.  Per ER doc her stool is heme positive 2 units of packed RBC have been ordered She will be admitted to the hospital for further evaluation  9/29 s/p EGD, no acute findings. No further w/u per GI. 9/30 scoring on ciwa protocal.    Consultants:  GI  Procedures: EGD  Antimicrobials:      Subjective: Less groggy today, no complaints.need wrist restrained last night due to acute withdrawal delirium  Objective: Vitals:    05/25/22 0110 05/25/22 0410 05/25/22 0820 05/25/22 1217  BP: (!) 136/53 (!) 148/64 135/61 (!) 151/65  Pulse: (!) 106 95 95 87  Resp: 20 20 18 18   Temp: 98.7 F (37.1 C) 99 F (37.2 C) 97.9 F (36.6 C) 97.8 F (36.6 C)  TempSrc:  Oral    SpO2: 90% 99% 94% 93%  Weight:      Height:        Intake/Output Summary (Last 24 hours) at 05/25/2022 1426 Last data filed at 05/24/2022 1900 Gross per 24 hour  Intake 400 ml  Output --  Net 400 ml   Filed Weights   05/23/22 0811  Weight: 61.2 kg    Examination: Left eye mild ecchymosis, NAD Cta no w/r Reg s1/s2 no gallop Soft benign +bs No edema Less groggy this am. Awakens, communicates. Mood and affect appropriate in current setting    Data Reviewed: I have personally reviewed following labs and imaging studies  CBC: Recent Labs  Lab 05/23/22 0815 05/23/22 1703 05/24/22 0139 05/24/22 1006 05/25/22 0534  WBC 8.7  --   --   --  7.9  NEUTROABS 6.8  --   --   --   --   HGB 5.7* 8.1* 8.3* 9.6* 8.7*  HCT 18.6* 24.9* 25.4* 29.9* 27.1*  MCV 100.5*  --   --   --  94.1  PLT 234  --   --   --  588   Basic Metabolic Panel: Recent Labs  Lab 05/23/22  0815 05/24/22 0542  NA 135 140  K 4.3 3.8  CL 101 111  CO2 26 27  GLUCOSE 119* 118*  BUN 13 10  CREATININE 0.61 0.48  CALCIUM 8.1* 7.8*   GFR: Estimated Creatinine Clearance: 63.9 mL/min (by C-G formula based on SCr of 0.48 mg/dL). Liver Function Tests: Recent Labs  Lab 05/23/22 0815  AST 34  ALT 18  ALKPHOS 58  BILITOT 2.1*  PROT 6.4*  ALBUMIN 3.1*   No results for input(s): "LIPASE", "AMYLASE" in the last 168 hours. Recent Labs  Lab 05/23/22 1103  AMMONIA 15   Coagulation Profile: Recent Labs  Lab 05/23/22 1103  INR 1.3*   Cardiac Enzymes: No results for input(s): "CKTOTAL", "CKMB", "CKMBINDEX", "TROPONINI" in the last 168 hours. BNP (last 3 results) No results for input(s): "PROBNP" in the last 8760 hours. HbA1C: No results for input(s): "HGBA1C" in  the last 72 hours. CBG: No results for input(s): "GLUCAP" in the last 168 hours. Lipid Profile: No results for input(s): "CHOL", "HDL", "LDLCALC", "TRIG", "CHOLHDL", "LDLDIRECT" in the last 72 hours. Thyroid Function Tests: No results for input(s): "TSH", "T4TOTAL", "FREET4", "T3FREE", "THYROIDAB" in the last 72 hours. Anemia Panel: Recent Labs    05/24/22 1453 05/24/22 1555  VITAMINB12  --  1,272*  FOLATE 15.4  --    Sepsis Labs: No results for input(s): "PROCALCITON", "LATICACIDVEN" in the last 168 hours.  No results found for this or any previous visit (from the past 240 hour(s)).       Radiology Studies: US Abdomen Limited RUQ (LIVER/GB)  Result Date: 05/23/2022 CLINICAL DATA:  A 66 year old female presents with elevated liver enzymes. EXAM: ULTRASOUND ABDOMEN LIMITED RIGHT UPPER QUADRANT COMPARISON:  None Available. FINDINGS: Gallbladder: No gallstones or wall thickening visualized. No sonographic Murphy sign noted by sonographer. Common bile duct: Diameter: 5.5 mm Liver: No focal lesion identified. Mild parenchymal increased echogenicity. Portal vein is patent on color Doppler imaging with normal direction of blood flow towards the liver. Other: Limited study due to patient body habitus. Query RIGHT pleural effusion versus artifact. IMPRESSION: No acute biliary process. Limited evaluation of the liver with suggestion of mild hepatic steatosis. Query RIGHT-sided pleural effusion versus artifact. Electronically Signed   By: Donzetta Kohut M.D.   On: 05/23/2022 19:07        Scheduled Meds:  feeding supplement  237 mL Oral TID BM   folic acid  1 mg Oral Daily   LORazepam  0-4 mg Intravenous Q12H   multivitamin with minerals  1 tablet Oral Daily   [START ON 05/27/2022] pantoprazole  40 mg Intravenous Q12H   thiamine  100 mg Oral Daily   Or   thiamine  100 mg Intravenous Daily   Continuous Infusions:  dextrose 5 % and 0.9% NaCl 100 mL/hr at 05/24/22 2002    Assessment &  Plan:   Principal Problem:   ABLA (acute blood loss anemia) Active Problems:   Alcohol abuse   Essential hypertension   Benzodiazepine abuse (HCC)   Frequent falls   Malnutrition of moderate degree   ABLA (acute blood loss anemia) Patient presents to the ER for evaluation of a fall and noted to have a hemoglobin of 5.7, at her baseline of 12.4 She has had occasional black stools and admits to NSAID use. We will start patient on Protonix drip Transfused 2 units of packed RBC 9/29 s/p EGD -unremarkable, Gi has no further w/u plans.  8/30 h/h down this am. Reached out to GI about  cscope as initially plan was as outpt. But with acute drop in h/h again, may need to do further evaluation. Will f/u   Alcohol abuse On folate and thiamine Currently on CIWA protocol we will continue    Essential hypertension Continue holding BP meds  BP stable     Frequent falls Patient has had several falls following her hospital discharge and was brought in for evaluation after a fall.   She is noted to have comminuted and displaced fractures involving the anterior and posterolateral walls of left maxillary sinus. The sinus is filled with hematoma.  She also has a comminuted and displaced fracture of the left zygoma. The lateral orbital wall is fractured along with the orbital floor. No evidence of entrapment of the inferior rectus muscle. The orbital roof is intact and the medial orbital wall is intact. Pain control Fall precautions 9/30 OT recommends SNF.  PT pending   Benzodiazepine abuse (HCC) Patient has a history of benzodiazepine dependence/abuse and was recently hospitalized for benzodiazepine withdrawal with delirium. She was seen by psychiatry during that hospitalization and was discharged on Klonopin and citalopram but according to her daughter she was started back on Xanax by her psychiatrist. Patient is at risk of going through withdrawal again. 9/29 IV lorazepam as needed            DVT prophylaxis: scd Code Status:full Family Communication: none at bedside Disposition Plan:  Status is: Inpatient Remains inpatient appropriate because: iv treatment. Snf pending        LOS: 2 days   Time spent: 35 min    Lynn Ito, MD [facility] Pager [phone]  If 7PM-7AM, please contact night-coverage 05/25/2022, 2:26 PM

## 2022-05-25 NOTE — Progress Notes (Signed)
PT Cancellation Note  Patient Details Name: Stephanie Skinner MRN: [id number] DOB: [birth year]   Cancelled Treatment:    Reason Eval/Treat Not Completed: Other (comment).  Chart reviewed and attempted to see pt.  Pt stating that she would not be participating in anymore therapy today.  Pt notes that she is tired and just does not feel like moving at this point in time.  Re-attempted at later time and pt still not willing to participate.  To be evaluation at later date/time as medically appropriate.   Gwenlyn Saran, PT, DPT 05/25/22, 4:51 PM

## 2022-05-25 NOTE — TOC Progression Note (Signed)
Transition of Care [facility]) - Progression Note    Patient Details  Name: Stephanie Skinner MRN: [id number] Date of Birth: 12-24-1955  Transition of [facility] CM/SW Contact  Izola Price, RN Phone Number: 05/25/2022, 3:43 PM  Clinical Narrative: 9/30: RN CM spoke with daughter per provider request and according to her statements, patient had taken 62 xanax between the 18th and this admission and when discharge to goes to PCP who precribes her 90 xanax (blue), Adderall and Subaxone. Daughter is unsure if PCP is aware. She is unsure what patient gets or trades with others for drugs, but believes that goes on.  Daughter states that patient is falling, she feels, bc she is under influence of misusing/abusing Xanax with ETOH.  She states patient will get her Rx's filled and take them all in a few days then withdrawal and use other things till refills due. RN CM told her some inpatient resources and other materials would be in patient's AVS summary and this information would be passed along to provider. Daughter would like to request a psych eval after withdrawal to evaluate competency to make decisions. Simmie Davies RN CM          Expected Discharge Plan and Services                                                 Social Determinants of Health (SDOH) Interventions    Readmission Risk Interventions     No data to display

## 2022-05-25 NOTE — Evaluation (Addendum)
Occupational Therapy Evaluation Patient Details Name: Stephanie Skinner MRN: [id number] DOB: [birth year] Today's Date: 05/25/2022   History of Present Illness Stephanie Skinner is a 66 y.o. female with medical history significant for benzodiazepine dependence, alcohol dependence, hypertension, recent hospital discharge on 05/03/22 following admission for acute metabolic encephalopathy secondary to alcohol and benzodiazepine withdrawal complicated by delirium and COVID-19 infection.   Clinical Impression   Patient seen for OT evaluation. Patient presenting with decreased cognition, safety awareness, endurance, and balance impacting safety and independence in ADLs. At baseline, pt was independent in ADLs, IADLs, and functional mobility without an AD. Patient currently functioning at [facility] for functional transfers, Mod A for LB dressing, and supervision for peri care in sitting. Patient will benefit from acute OT to increase overall independence in the areas of ADLs and functional mobility in order to safely discharge to next venue of care. At this time OT recommends STR at discharge, however, could improve to home health OT pending progress while in this hospital and caregiver support at home.     Recommendations for follow up therapy are one component of a multi-disciplinary discharge planning process, led by the attending physician.  Recommendations may be updated based on patient status, additional functional criteria and insurance authorization.   Follow Up Recommendations  Skilled nursing-short term rehab (<3 hours/day)    Assistance Recommended at Discharge Frequent or constant Supervision/Assistance  Patient can return home with the following A little help with walking and/or transfers;A little help with bathing/dressing/bathroom;Assistance with cooking/housework;Direct supervision/assist for financial management;Direct supervision/assist for medications management;Help with stairs or ramp for  entrance;Assist for transportation    Functional Status Assessment  Patient has had a recent decline in their functional status and demonstrates the ability to make significant improvements in function in a reasonable and predictable amount of time.  Equipment Recommendations  Other (comment) (defer to next venue of care)    Recommendations for Other Services       Precautions / Restrictions Precautions Precautions: Fall Restrictions Weight Bearing Restrictions: No      Mobility Bed Mobility Overal bed mobility: Needs Assistance Bed Mobility: Supine to Sit, Sit to Supine     Supine to sit: Supervision Sit to supine: Min assist (for LEs)        Transfers Overall transfer level: Needs assistance Equipment used: 1 person hand held assist Transfers: Bed to chair/wheelchair/BSC, Sit to/from Stand Sit to Stand: Min assist     Step pivot transfers: Min assist            Balance Overall balance assessment: Needs assistance Sitting-balance support: Feet supported, Bilateral upper extremity supported Sitting balance-Leahy Scale: Fair     Standing balance support: Single extremity supported Standing balance-Leahy Scale: Poor Standing balance comment: Pt deferring use of RW, handheld assist provided                           ADL either performed or assessed with clinical judgement   ADL Overall ADL's : Needs assistance/impaired     Grooming: Wash/dry face;Set up;Sitting               Lower Body Dressing: Sitting/lateral leans;Sit to/from stand;Moderate assistance;Min guard Lower Body Dressing Details (indicate cue type and reason): Mod A to thread BLEs through brief, able to hike in standing with Min guard Toilet Transfer: BSC/3in1;Cueing for sequencing;Cueing for safety;Minimal assistance Toilet Transfer Details (indicate cue type and reason): via handheld assist Toileting- Clothing Manipulation and  Hygiene: Supervision/safety;Sitting/lateral  lean Toileting - Clothing Manipulation Details (indicate cue type and reason): pericare in sitting     Functional mobility during ADLs: Cueing for sequencing;Cueing for safety;Minimal assistance       Vision Baseline Vision/History: 1 Wears glasses (reading only) Patient Visual Report: No change from baseline       Perception     Praxis      Pertinent Vitals/Pain Pain Assessment Pain Assessment: No/denies pain     Hand Dominance     Extremity/Trunk Assessment Upper Extremity Assessment Upper Extremity Assessment: Generalized weakness   Lower Extremity Assessment Lower Extremity Assessment: Defer to PT evaluation       Communication Communication Communication: No difficulties   Cognition Arousal/Alertness: Lethargic, Suspect due to medications Behavior During Therapy: Restless Overall Cognitive Status: No family/caregiver present to determine baseline cognitive functioning Area of Impairment: Orientation, Attention, Memory, Following commands, Safety/judgement, Awareness, Problem solving                 Orientation Level: Disoriented to, Time (pt able to state name, DOB, [place], and that she fell) Current Attention Level: Focused Memory: Decreased recall of precautions Following Commands: Follows one step commands inconsistently Safety/Judgement: Decreased awareness of safety, Decreased awareness of deficits Awareness: Intellectual Problem Solving: Requires verbal cues, Requires tactile cues General Comments: VC throughout for safety awareness. Attempting to throw legs over side of bed stating "I'm going to the store". Efforts to reorient pt failed and caused more frustration, pt perseverating on going to the store to get cigarettes. Pt grabbing onto furniture instead of using RW.     General Comments  Brusing on face, arms, legs    Exercises Other Exercises Other Exercises: OT provided education re: role of OT, OT POC, post acute recs, sitting up for  all meals, EOB/OOB mobility with assistance, home/fall safety.     Shoulder Instructions      Home Living Family/patient expects to be discharged to:: Private residence Living Arrangements: Non-relatives/Friends Available Help at Discharge: Friend(s);Available PRN/intermittently Type of Home: House Home Access: Stairs to enter Entrance Stairs-Number of Steps: 1 Entrance Stairs-Rails: None Home Layout: One level     Bathroom Shower/Tub: Tub/shower unit     Additional comments: PLOF/home set up could use clarification due to pt being lethargic during eval and no family present to confirm.               Prior Functioning/Environment Prior Level of Function : Independent/Modified Independent;Driving;History of Falls (last six months)             Mobility Comments: Pt reports being independent with functional mobility, no AD. Multiple falls recently. ADLs Comments: Pt reports being independent in ADLs/IADLs        OT Problem List: Decreased strength;Decreased activity tolerance;Impaired balance (sitting and/or standing);Decreased cognition;Decreased safety awareness;Decreased knowledge of use of DME or AE      OT Treatment/Interventions: Self-care/ADL training;Therapeutic exercise;Patient/family education;Balance training;Energy conservation;Therapeutic activities;DME and/or AE instruction;Cognitive remediation/compensation    OT Goals(Current goals can be found in the care plan section) Acute Rehab OT Goals Patient Stated Goal: go home OT Goal Formulation: Patient unable to participate in goal setting Time For Goal Achievement: 06/08/22 Potential to Achieve Goals: Good ADL Goals Pt Will Perform Grooming: Independently;standing Pt Will Perform Upper Body Dressing: Independently;sitting Pt Will Perform Lower Body Dressing: Independently;sitting/lateral leans;sit to/from stand Pt Will Transfer to Toilet: Independently;ambulating;regular height toilet Pt Will Perform  Toileting - Clothing Manipulation and hygiene: Independently;sit to/from stand  OT Frequency: Min 2X/week  Co-evaluation              AM-PAC OT "6 Clicks" Daily Activity     Outcome Measure Help from another person eating meals?: None Help from another person taking care of personal grooming?: A Little Help from another person toileting, which includes using toliet, bedpan, or urinal?: A Little Help from another person bathing (including washing, rinsing, drying)?: A Lot Help from another person to put on and taking off regular upper body clothing?: A Lot Help from another person to put on and taking off regular lower body clothing?: A Little 6 Click Score: 17   End of Session Equipment Utilized During Treatment: Gait belt Nurse Communication: Mobility status  Activity Tolerance: Patient limited by lethargy Patient left: in bed;with call bell/phone within reach;with bed alarm set  OT Visit Diagnosis: Unsteadiness on feet (R26.81);Repeated falls (R29.6);History of falling (Z91.81);Muscle weakness (generalized) (M62.81)                Time: 0737-1062 OT Time Calculation (min): 18 min Charges:  OT General Charges $OT Visit: 1 Visit OT Evaluation $OT Eval Low Complexity: 1 Low  [facility] MS, OTR/L ascom [phone]  05/25/22, 1:26 PM

## 2022-05-26 DIAGNOSIS — D62 Acute posthemorrhagic anemia: Secondary | ICD-10-CM | POA: Diagnosis not present

## 2022-05-26 LAB — HEMOGLOBIN AND HEMATOCRIT, BLOOD
HCT: 27.6 % — ABNORMAL LOW (ref 36.0–46.0)
Hemoglobin: 8.7 g/dL — ABNORMAL LOW (ref 12.0–15.0)

## 2022-05-26 NOTE — Progress Notes (Signed)
Bilateral mitts placed on patient at 0100 as she is agitated, combative, and noncompliant with lines, telemetry, and continues to attempt to get out of bed. Patient not redirectable. Ativan to be given PRN based off of CIWA score. Fall mat in place, fall bracelet on, and bed alarmed. Will continue to monitor and round on patient frequently.

## 2022-05-26 NOTE — Evaluation (Signed)
Physical Therapy Evaluation Patient Details Name: Stephanie Skinner MRN: [id number] DOB: [birth year] Today's Date: 05/26/2022  History of Present Illness  Pt is a 66 y.o. female presenting to hospital 9/28 after multiple falls over past week including 3 falls within past 24 hours; c/o facial pain to L aspect of her face, R shoulder and LE pain; also generalized weakness.  Imaging showing comminuted and displaced fractures involving the anterior and posterolateral walls of the left maxillary sinus; comminuted and displaced fracture of the left zygoma; Left lateral orbital wall fracture and left orbital floor fracture.  Pt admitted with acute blood loss anemia (Hgb 5.7), alcohol abuse, benzodiazepine abuse, and frequent falls.  PMH includes benzodiazepine dependence, alcohol dependence, htn, recent hospital discharge 9/8 (acute metabolic encephalopathy secondary to alcohol and benzodiazepine withdrawal complicated by delirium and COVID-19 infection), Hepatitis C, seizures, and elbow surgery.  Clinical Impression  Prior to hospital admission, pt reports being independent with ambulation; lives with a roommate.  Pt reporting no pain during session.  Currently pt is SBA semi-supine to sitting edge of bed; min assist with transfers (using RW); and min assist to ambulate 10 feet x2 with RW use.  Pt unsteady, impulsive, and inconsistent with following 1 step cues during session; impaired short term memory also noted.  Pt would benefit from skilled PT to address noted impairments and functional limitations (see below for any additional details).  Upon hospital discharge, pt would benefit from SNF.    Recommendations for follow up therapy are one component of a multi-disciplinary discharge planning process, led by the attending physician.  Recommendations may be updated based on patient status, additional functional criteria and insurance authorization.  Follow Up Recommendations Skilled nursing-short term rehab (<3  hours/day) Can patient physically be transported by private vehicle: Yes    Assistance Recommended at Discharge Frequent or constant Supervision/Assistance  Patient can return home with the following  A little help with walking and/or transfers;A little help with bathing/dressing/bathroom;Assistance with cooking/housework;Direct supervision/assist for medications management;Direct supervision/assist for financial management;Assist for transportation;Help with stairs or ramp for entrance    Equipment Recommendations Rolling walker (2 wheels);BSC/3in1  Recommendations for Other Services       Functional Status Assessment Patient has had a recent decline in their functional status and demonstrates the ability to make significant improvements in function in a reasonable and predictable amount of time.     Precautions / Restrictions Precautions Precautions: Fall Restrictions Weight Bearing Restrictions: No      Mobility  Bed Mobility Overal bed mobility: Needs Assistance Bed Mobility: Supine to Sit     Supine to sit: Supervision, HOB elevated     General bed mobility comments: SBA for safety and lines    Transfers Overall transfer level: Needs assistance Equipment used: Rolling walker (2 wheels) Transfers: Sit to/from Stand, Bed to chair/wheelchair/BSC Sit to Stand: Min assist   Step pivot transfers: Min assist       General transfer comment: assist to steady standing x1 trial from bed, x1 trial from [facility], and x1 trial from recliner (vc's for UE/LE placement); stand step turn with RW min assist to steady; pt had difficulty following cues and sequencing to scoot back in recliner end of session (extra time required to perform)    Ambulation/Gait Ambulation/Gait assistance: Min assist Gait Distance (Feet):  (10 feet x2) Assistive device: Rolling walker (2 wheels)   Gait velocity: decreased     General Gait Details: mildly antalgic (decreased stance time R LE); short B  LE  step length and decreased B LE foot clearance; assist to steady  Careers information officer    Modified Rankin (Stroke Patients Only)       Balance Overall balance assessment: Needs assistance Sitting-balance support: No upper extremity supported, Feet supported Sitting balance-Leahy Scale: Fair Sitting balance - Comments: steady static sitting   Standing balance support: Bilateral upper extremity supported, During functional activity, Reliant on assistive device for balance Standing balance-Leahy Scale: Poor Standing balance comment: assist required for standing balance with dynamic activities                             Pertinent Vitals/Pain Pain Assessment Pain Assessment: No/denies pain Vitals (HR and O2 on 2 L via nasal cannula) stable and WFL throughout treatment session.    Home Living Family/patient expects to be discharged to:: Private residence Living Arrangements: Non-relatives/Friends (Pt's roommate Stephanie Skinner) Available Help at Discharge: Friend(s);Available PRN/intermittently Type of Home: House Home Access: Stairs to enter Entrance Stairs-Rails:  (pt reports having a railing but unable to state which side it is on) Entrance Stairs-Number of Steps: 1   Home Layout: One level        Prior Function Prior Level of Function : Independent/Modified Independent;Driving;History of Falls (last six months)             Mobility Comments: Pt initially stating no recent falls but then reporting recent falls.  Independent with ambulation (no AD use). ADLs Comments: Pt reports being independent in ADLs/IADLs.     Hand Dominance        Extremity/Trunk Assessment   Upper Extremity Assessment Upper Extremity Assessment: Generalized weakness    Lower Extremity Assessment Lower Extremity Assessment: Generalized weakness    Cervical / Trunk Assessment Cervical / Trunk Assessment: Normal  Communication   Communication: No  difficulties  Cognition Arousal/Alertness: Awake/alert Behavior During Therapy: Impulsive Overall Cognitive Status: No family/caregiver present to determine baseline cognitive functioning Area of Impairment: Orientation, Attention, Memory, Following commands, Safety/judgement, Awareness, Problem solving                 Orientation Level: Disoriented to, Time, Place, Situation (pt reported it was October 1920 and was at [facility] in [place]; general situation confusion noted) Current Attention Level: Focused Memory: Decreased recall of precautions Following Commands: Follows one step commands inconsistently Safety/Judgement: Decreased awareness of safety, Decreased awareness of deficits Awareness: Intellectual Problem Solving: Requires verbal cues, Requires tactile cues, Slow processing, Difficulty sequencing, Decreased initiation          General Comments General comments (skin integrity, edema, etc.): bruising noted on face, arms, and LE's.  Nursing cleared pt for participation in physical therapy.  Pt agreeable to PT session.    Exercises  Transfer and gait training   Assessment/Plan    PT Assessment Patient needs continued PT services  PT Problem List Decreased strength;Decreased activity tolerance;Decreased balance;Decreased mobility;Decreased cognition;Decreased knowledge of use of DME;Decreased safety awareness;Decreased knowledge of precautions       PT Treatment Interventions DME instruction;Gait training;Stair training;Functional mobility training;Therapeutic activities;Therapeutic exercise;Balance training;Patient/family education    PT Goals (Current goals can be found in the Care Plan section)  Acute Rehab PT Goals Patient Stated Goal: to improve mobility and balance PT Goal Formulation: With patient Time For Goal Achievement: 06/09/22 Potential to Achieve Goals: Good    Frequency Min 2X/week     Co-evaluation  AM-PAC PT  "6 Clicks" Mobility  Outcome Measure Help needed turning from your back to your side while in a flat bed without using bedrails?: None Help needed moving from lying on your back to sitting on the side of a flat bed without using bedrails?: A Little Help needed moving to and from a bed to a chair (including a wheelchair)?: A Little Help needed standing up from a chair using your arms (e.g., wheelchair or bedside chair)?: A Little Help needed to walk in hospital room?: A Little Help needed climbing 3-5 steps with a railing? : A Lot 6 Click Score: 18    End of Session Equipment Utilized During Treatment: Gait belt;Oxygen (2 L O2 via nasal cannula) Activity Tolerance: Patient limited by fatigue Patient left: in chair;with call bell/phone within reach;with chair alarm set;with nursing/sitter in room;Other (comment) (fall mat in place) Nurse Communication: Mobility status;Precautions;Other (comment) (pt sitting in recliner with chair alarm on) PT Visit Diagnosis: Unsteadiness on feet (R26.81);Muscle weakness (generalized) (M62.81);History of falling (Z91.81);Other abnormalities of gait and mobility (R26.89)    Time: 4166-0630 PT Time Calculation (min) (ACUTE ONLY): 41 min   Charges:   PT Evaluation $PT Eval Low Complexity: 1 Low PT Treatments $Therapeutic Activity: 8-22 mins       Leitha Bleak, PT 05/26/22, 3:57 PM

## 2022-05-26 NOTE — Progress Notes (Signed)
PROGRESS NOTE    Stephanie Skinner  NLG:[id number] DOB: [birth year] DOA: 05/23/2022 PCP: Charlies Constable, MD    Brief Narrative:   Stephanie Skinner is a 66 y.o. female with medical history significant for benzodiazepine dependence, alcohol dependence, hypertension, recent hospital discharge on 05/03/22 following admission for acute metabolic encephalopathy secondary to alcohol and benzodiazepine withdrawal complicated by delirium and COVID-19 infection. Patient was discharged home in stable condition and according to her roommate she started taking the 'pills" again.  He notes that she has been increasingly lethargic and has had frequent falls.  He called EMS on the day of admission after she fell landing on her face.  She complained of pain in her right shoulder as well as in her legs.  She received a dose of Toradol in the ER and during my evaluation appears very lethargic and speech is not clear.  Per her roommate she still drinks alcohol and her last use was about 3 days ago. She complains of abdominal pain that is nonlocalized and is unable to rate the pain.  She states that her stools are sometimes black but denies any blood or any hematemesis.  She denies feeling dizzy or lightheaded and denies having any chest pain or shortness of breath.  She did is having any headache, no fever, no chills, no cough, no blurred vision or focal deficit. She notes that she has had frequent falls but is unable to tell me why. Labs show a hemoglobin of 5.7 compared to baseline of 12.4.  Per ER doc her stool is heme positive 2 units of packed RBC have been ordered She will be admitted to the hospital for further evaluation  9/29 s/p EGD, no acute findings. No further w/u per GI. 9/30 scoring on ciwa protocal.  10/1 scoring 21 on CIWA protocol.  This happened this AM.  In mittens. Now on my exam awakens and responds appropriately, but falls back a sleep. Told her breakfast is here and she wants to  eat.   Consultants:  GI  Procedures: EGD  Antimicrobials:      Subjective: Denies sob, cp  Objective: Vitals:   05/26/22 0115 05/26/22 0349 05/26/22 0751 05/26/22 1206  BP:  131/86 (!) 158/76 (!) 162/76  Pulse:  92 95 92  Resp:  18 18 20   Temp:  98.3 F (36.8 C) 98.2 F (36.8 C) 98.2 F (36.8 C)  TempSrc:  Oral  Oral  SpO2: 95% 99% 100% 98%  Weight:      Height:        Intake/Output Summary (Last 24 hours) at 05/26/2022 1236 Last data filed at 05/25/2022 2045 Gross per 24 hour  Intake 240 ml  Output --  Net 240 ml   Filed Weights   05/23/22 0811  Weight: 61.2 kg    Examination: Calm, NAD Cta no w/r Reg s1/s2 no gallop Soft benign +bs No edema, in mittens Awakens, oriented x3 Mood and affect appropriate in current setting    Data Reviewed: I have personally reviewed following labs and imaging studies  CBC: Recent Labs  Lab 05/23/22 0815 05/23/22 1703 05/24/22 0139 05/24/22 1006 05/25/22 0534 05/26/22 0500  WBC 8.7  --   --   --  7.9  --   NEUTROABS 6.8  --   --   --   --   --   HGB 5.7* 8.1* 8.3* 9.6* 8.7* 8.7*  HCT 18.6* 24.9* 25.4* 29.9* 27.1* 27.6*  MCV 100.5*  --   --   --  94.1  --   PLT 234  --   --   --  236  --    Basic Metabolic Panel: Recent Labs  Lab 05/23/22 0815 05/24/22 0542  NA 135 140  K 4.3 3.8  CL 101 111  CO2 26 27  GLUCOSE 119* 118*  BUN 13 10  CREATININE 0.61 0.48  CALCIUM 8.1* 7.8*   GFR: Estimated Creatinine Clearance: 63.9 mL/min (by C-G formula based on SCr of 0.48 mg/dL). Liver Function Tests: Recent Labs  Lab 05/23/22 0815  AST 34  ALT 18  ALKPHOS 58  BILITOT 2.1*  PROT 6.4*  ALBUMIN 3.1*   No results for input(s): "LIPASE", "AMYLASE" in the last 168 hours. Recent Labs  Lab 05/23/22 1103  AMMONIA 15   Coagulation Profile: Recent Labs  Lab 05/23/22 1103  INR 1.3*   Cardiac Enzymes: No results for input(s): "CKTOTAL", "CKMB", "CKMBINDEX", "TROPONINI" in the last 168 hours. BNP (last 3  results) No results for input(s): "PROBNP" in the last 8760 hours. HbA1C: No results for input(s): "HGBA1C" in the last 72 hours. CBG: No results for input(s): "GLUCAP" in the last 168 hours. Lipid Profile: No results for input(s): "CHOL", "HDL", "LDLCALC", "TRIG", "CHOLHDL", "LDLDIRECT" in the last 72 hours. Thyroid Function Tests: No results for input(s): "TSH", "T4TOTAL", "FREET4", "T3FREE", "THYROIDAB" in the last 72 hours. Anemia Panel: Recent Labs    05/24/22 1453 05/24/22 1555  VITAMINB12  --  1,272*  FOLATE 15.4  --    Sepsis Labs: No results for input(s): "PROCALCITON", "LATICACIDVEN" in the last 168 hours.  No results found for this or any previous visit (from the past 240 hour(s)).       Radiology Studies: No results found.      Scheduled Meds:  feeding supplement  237 mL Oral TID BM   folic acid  1 mg Oral Daily   LORazepam  0-4 mg Intravenous Q12H   multivitamin with minerals  1 tablet Oral Daily   pantoprazole  40 mg Oral BID   thiamine  100 mg Oral Daily   Or   thiamine  100 mg Intravenous Daily   Continuous Infusions:  dextrose 5 % and 0.9% NaCl 75 mL/hr at 05/26/22 0125    Assessment & Plan:   Principal Problem:   ABLA (acute blood loss anemia) Active Problems:   Alcohol abuse   Essential hypertension   Benzodiazepine abuse (HCC)   Frequent falls   Malnutrition of moderate degree   ABLA (acute blood loss anemia) Patient presents to the ER for evaluation of a fall and noted to have a hemoglobin of 5.7, at her baseline of 12.4 She has had occasional black stools and admits to NSAID use. We will start patient on Protonix drip Transfused 2 units of packed RBC 9/29 s/p EGD -unremarkable,  10/1 h/h stable. Will monitor.  Alcohol abuse On folate and thiamine 10/1still scoring on ciwa protocal. 21 early this am. Will continue       Essential hypertension Continue holding BP meds  10/1 stable     Frequent falls Patient has  had several falls following her hospital discharge and was brought in for evaluation after a fall.   She is noted to have comminuted and displaced fractures involving the anterior and posterolateral walls of left maxillary sinus. The sinus is filled with hematoma.  She also has a comminuted and displaced fracture of the left zygoma. The lateral orbital wall is fractured along with the orbital floor. No evidence of  entrapment of the inferior rectus muscle. The orbital roof is intact and the medial orbital wall is intact. Pain control Fall precautions 9/30 OT recommends SNF. 10/1 PT evalve pending as pt declined today  Benzodiazepine abuse (HCC) Patient has a history of benzodiazepine dependence/abuse and was recently hospitalized for benzodiazepine withdrawal with delirium. She was seen by psychiatry during that hospitalization and was discharged on Klonopin and citalopram but according to her daughter she was started back on Xanax by her psychiatrist. Patient is at risk of going through withdrawal again. 9/29 IV lorazepam as needed  Per family request will have psych evaluate pt's competency           DVT prophylaxis: scd Code Status:full Family Communication: none at bedside Disposition Plan:  Status is: Inpatient Remains inpatient appropriate because: iv treatment. Still scoring on ciwa protocal        LOS: 3 days   Time spent: 35 min    Lynn Ito, MD [facility] Pager [phone]  If 7PM-7AM, please contact night-coverage 05/26/2022, 12:36 PM

## 2022-05-27 ENCOUNTER — Encounter: Payer: Self-pay | Admitting: Gastroenterology

## 2022-05-27 ENCOUNTER — Inpatient Hospital Stay: Payer: Medicare Other

## 2022-05-27 DIAGNOSIS — D62 Acute posthemorrhagic anemia: Secondary | ICD-10-CM | POA: Diagnosis not present

## 2022-05-27 LAB — BLOOD GAS, ARTERIAL
Acid-Base Excess: 3 mmol/L — ABNORMAL HIGH (ref 0.0–2.0)
Bicarbonate: 29 mmol/L — ABNORMAL HIGH (ref 20.0–28.0)
O2 Content: 2 L/min
O2 Saturation: 96.9 %
Patient temperature: 37
pCO2 arterial: 49 mmHg — ABNORMAL HIGH (ref 32–48)
pH, Arterial: 7.38 (ref 7.35–7.45)
pO2, Arterial: 81 mmHg — ABNORMAL LOW (ref 83–108)

## 2022-05-27 MED ORDER — SODIUM CHLORIDE 0.9% FLUSH
10.0000 mL | Freq: Two times a day (BID) | INTRAVENOUS | Status: DC
  Administered 2022-05-27 – 2022-06-02 (×6): 10 mL

## 2022-05-27 MED ORDER — RISPERIDONE 1 MG PO TBDP
0.5000 mg | ORAL_TABLET | Freq: Four times a day (QID) | ORAL | Status: DC | PRN
  Administered 2022-05-28 – 2022-06-03 (×11): 0.5 mg via ORAL
  Filled 2022-05-27 (×14): qty 0.5

## 2022-05-27 MED ORDER — SODIUM CHLORIDE 0.9% FLUSH: 10.0000 mL | INTRAVENOUS | Status: DC | PRN

## 2022-05-27 MED ORDER — ALPRAZOLAM 0.5 MG PO TABS
0.5000 mg | ORAL_TABLET | Freq: Three times a day (TID) | ORAL | Status: DC
  Administered 2022-05-27 – 2022-06-03 (×18): 0.5 mg via ORAL
  Filled 2022-05-27 (×20): qty 1

## 2022-05-27 NOTE — Progress Notes (Signed)
Patient climbed over side rail. Found on floor confused and combative. Patient denies hitting head. Notified MD. Vital signs obtained. Ativan given.

## 2022-05-27 NOTE — Progress Notes (Signed)
PROGRESS NOTE    Stephanie Skinner  CXK:[id number] DOB: May 03, 1956 DOA: 05/23/2022 PCP: Laurell Josephs, MD    Brief Narrative:   Stephanie Skinner is a 66 y.o. female with medical history significant for benzodiazepine dependence, alcohol dependence, hypertension, recent hospital discharge on 05/03/22 following admission for acute metabolic encephalopathy secondary to alcohol and benzodiazepine withdrawal complicated by delirium and COVID-19 infection. Patient was discharged home in stable condition and according to her roommate she started taking the 'pills" again.  He notes that she has been increasingly lethargic and has had frequent falls.  He called EMS on the day of admission after she fell landing on her face.  She complained of pain in her right shoulder as well as in her legs.  She received a dose of Toradol in the ER and during my evaluation appears very lethargic and speech is not clear.  Per her roommate she still drinks alcohol and her last use was about 3 days ago. She complains of abdominal pain that is nonlocalized and is unable to rate the pain.  She states that her stools are sometimes black but denies any blood or any hematemesis.  She denies feeling dizzy or lightheaded and denies having any chest pain or shortness of breath.  She did is having any headache, no fever, no chills, no cough, no blurred vision or focal deficit. She notes that she has had frequent falls but is unable to tell me why. Labs show a hemoglobin of 5.7 compared to baseline of 12.4.  Per ER doc her stool is heme positive 2 units of packed RBC have been ordered She will be admitted to the hospital for further evaluation  9/29 s/p EGD, no acute findings. No further w/u per GI. 9/30 scoring on ciwa protocal.  10/1 scoring 21 on CIWA protocol.  This happened this AM.  In mittens. Now on my exam awakens and responds appropriately, but falls back a sleep. Told her breakfast is here and she wants to eat. 10/2 patient  was awake and alert this a.m. for me.  Answered my questions.  This is probably the most awake and I am seeing her this week.  After I had seen her she was scoring 14 on the CIWA protocol.  Later patient was found on the floor confused and combative.  Patient apparently denied hitting her head but were not sure.  CT of the head ordered.  Daughter updated. Psychiatry consulted per daughters request  Consultants:  GI  Procedures: EGD  Antimicrobials:      Subjective: Denies sob, cp, gi sx  Objective: Vitals:   05/27/22 0405 05/27/22 0803 05/27/22 1125 05/27/22 1320  BP: (!) 156/70 (!) 155/80 (!) 150/80 129/62  Pulse: 94 95 94 90  Resp: 19 16 16 18   Temp: 98.3 F (36.8 C) 99.4 F (37.4 C) 98.8 F (37.1 C) 97.7 F (36.5 C)  TempSrc: Oral Oral Oral Oral  SpO2: 94% 94% 97% 95%  Weight:      Height:        Intake/Output Summary (Last 24 hours) at 05/27/2022 1405 Last data filed at 05/27/2022 1021 Gross per 24 hour  Intake 120 ml  Output 900 ml  Net -780 ml   Filed Weights   05/23/22 0811 05/26/22 2145  Weight: 61.2 kg 65.5 kg    Examination: Calm, NAD Cta no w/r Reg s1/s2 no gallop Soft benign +bs No edema Awake and alert, oriented.  Mood and affect appropriate in current setting  Data Reviewed: I have personally reviewed following labs and imaging studies  CBC: Recent Labs  Lab 05/23/22 0815 05/23/22 1703 05/24/22 0139 05/24/22 1006 05/25/22 0534 05/26/22 0500  WBC 8.7  --   --   --  7.9  --   NEUTROABS 6.8  --   --   --   --   --   HGB 5.7* 8.1* 8.3* 9.6* 8.7* 8.7*  HCT 18.6* 24.9* 25.4* 29.9* 27.1* 27.6*  MCV 100.5*  --   --   --  94.1  --   PLT 234  --   --   --  236  --    Basic Metabolic Panel: Recent Labs  Lab 05/23/22 0815 05/24/22 0542  NA 135 140  K 4.3 3.8  CL 101 111  CO2 26 27  GLUCOSE 119* 118*  BUN 13 10  CREATININE 0.61 0.48  CALCIUM 8.1* 7.8*   GFR: Estimated Creatinine Clearance: 63.9 mL/min (by C-G formula based on SCr  of 0.48 mg/dL). Liver Function Tests: Recent Labs  Lab 05/23/22 0815  AST 34  ALT 18  ALKPHOS 58  BILITOT 2.1*  PROT 6.4*  ALBUMIN 3.1*   No results for input(s): "LIPASE", "AMYLASE" in the last 168 hours. Recent Labs  Lab 05/23/22 1103  AMMONIA 15   Coagulation Profile: Recent Labs  Lab 05/23/22 1103  INR 1.3*   Cardiac Enzymes: No results for input(s): "CKTOTAL", "CKMB", "CKMBINDEX", "TROPONINI" in the last 168 hours. BNP (last 3 results) No results for input(s): "PROBNP" in the last 8760 hours. HbA1C: No results for input(s): "HGBA1C" in the last 72 hours. CBG: No results for input(s): "GLUCAP" in the last 168 hours. Lipid Profile: No results for input(s): "CHOL", "HDL", "LDLCALC", "TRIG", "CHOLHDL", "LDLDIRECT" in the last 72 hours. Thyroid Function Tests: No results for input(s): "TSH", "T4TOTAL", "FREET4", "T3FREE", "THYROIDAB" in the last 72 hours. Anemia Panel: Recent Labs    05/24/22 1453 05/24/22 1555  VITAMINB12  --  1,272*  FOLATE 15.4  --    Sepsis Labs: No results for input(s): "PROCALCITON", "LATICACIDVEN" in the last 168 hours.  No results found for this or any previous visit (from the past 240 hour(s)).       Radiology Studies: No results found.      Scheduled Meds:  feeding supplement  237 mL Oral TID BM   folic acid  1 mg Oral Daily   multivitamin with minerals  1 tablet Oral Daily   pantoprazole  40 mg Oral BID   thiamine  100 mg Oral Daily   Or   thiamine  100 mg Intravenous Daily   Continuous Infusions:  dextrose 5 % and 0.9% NaCl 75 mL/hr at 05/27/22 0606    Assessment & Plan:   Principal Problem:   ABLA (acute blood loss anemia) Active Problems:   Alcohol abuse   Essential hypertension   Benzodiazepine abuse (HCC)   Frequent falls   Malnutrition of moderate degree   ABLA (acute blood loss anemia) Patient presents to the ER for evaluation of a fall and noted to have a hemoglobin of 5.7, at her baseline of  12.4 She has had occasional black stools and admits to NSAID use. We will start patient on Protonix drip Transfused 2 units of packed RBC 9/29 s/p EGD -unremarkable,  10/2 H&H stable Monitor periodically    Alcohol abuse On folate and thiamine Vitamin B12 elevated 10/2 still scoring today 14 Continue with CIWA protocol       Essential  hypertension Continue holding BP meds  10/2 stable     Frequent falls Patient has had several falls following her hospital discharge and was brought in for evaluation after a fall.   She is noted to have comminuted and displaced fractures involving the anterior and posterolateral walls of left maxillary sinus. The sinus is filled with hematoma.  She also has a comminuted and displaced fracture of the left zygoma. The lateral orbital wall is fractured along with the orbital floor. No evidence of entrapment of the inferior rectus muscle. The orbital roof is intact and the medial orbital wall is intact. Pain control Fall precautions 9/30 OT recommends SNF. 10/1 PT evalve pending as pt declined today 10/2 was found on the floor today.  Will obtain CT of the head as we are not sure she patient's had her head.  Patient denies hitting her head We will place her on telemetry sitter  Benzodiazepine abuse [facility]) Patient has a history of benzodiazepine dependence/abuse and was recently hospitalized for benzodiazepine withdrawal with delirium. She was seen by psychiatry during that hospitalization and was discharged on Klonopin and citalopram but according to her daughter she was started back on Xanax by her psychiatrist. Patient is at risk of going through withdrawal again. 9/29 IV lorazepam as needed  Per family request will have psych evaluate pt's competency  Per daughter she took "80 xanax in just few days".          DVT prophylaxis: scd Code Status:full Family Communication: daughter updated Disposition Plan:  Status is:  Inpatient Remains inpatient appropriate because: iv treatment. Still scoring on ciwa protocal. Psych consulted        LOS: 4 days   Time spent: 35 min    Nolberto Hanlon, MD [facility] Pager [phone]  If 7PM-7AM, please contact night-coverage 05/27/2022, 2:05 PM

## 2022-05-27 NOTE — Care Management Important Message (Signed)
Important Message  Patient Details  Name: Stephanie Skinner MRN: [id number] Date of Birth: Aug 29, 1955   Medicare Important Message Given:  Yes     Dannette Barbara 05/27/2022, 12:51 PM

## 2022-05-27 NOTE — Progress Notes (Signed)
Occupational Therapy Treatment Patient Details Name: FINNLEE GUARNIERI MRN: [id number] DOB: 31-Jul-1956 Today's Date: 05/27/2022   History of present illness Pt is a 66 y.o. female presenting to hospital 9/28 after multiple falls over past week including 3 falls within past 24 hours; c/o facial pain to L aspect of her face, R shoulder and LE pain; also generalized weakness.  Imaging showing comminuted and displaced fractures involving the anterior and posterolateral walls of the left maxillary sinus; comminuted and displaced fracture of the left zygoma; Left lateral orbital wall fracture and left orbital floor fracture.  Pt admitted with acute blood loss anemia (Hgb 5.7), alcohol abuse, benzodiazepine abuse, and frequent falls.  PMH includes benzodiazepine dependence, alcohol dependence, htn, recent hospital discharge 9/8 (acute metabolic encephalopathy secondary to alcohol and benzodiazepine withdrawal complicated by delirium and COVID-19 infection), Hepatitis C, seizures, and elbow surgery.   OT comments  Transitioned from PT in bathroom. Patient agreeable to OT services. Patient required min A for toilet transfer. Supervision for Pericare performed while sitting; successful BM. Patient also performed pericare while standing for thoroughness requiring min A for safety. Patient was able to ambulate ~75 ft to nursing station, requiring min A RW advancement and safety. Patient left at nursing station in recliner and all needs met.    Recommendations for follow up therapy are one component of a multi-disciplinary discharge planning process, led by the attending physician.  Recommendations may be updated based on patient status, additional functional criteria and insurance authorization.    Follow Up Recommendations  Skilled nursing-short term rehab (<3 hours/day)    Assistance Recommended at Discharge Frequent or constant Supervision/Assistance  Patient can return home with the following  A little help  with walking and/or transfers;A little help with bathing/dressing/bathroom;Assistance with cooking/housework;Direct supervision/assist for financial management;Direct supervision/assist for medications management;Help with stairs or ramp for entrance;Assist for transportation   Equipment Recommendations  Other (comment) (Defer to next venue of care.)    Recommendations for Other Services      Precautions / Restrictions Precautions Precautions: Fall Restrictions Weight Bearing Restrictions: No       Mobility Bed Mobility               General bed mobility comments: in recliner before and after    Transfers Overall transfer level: Needs assistance Equipment used: Rolling walker (2 wheels) Transfers: Sit to/from Stand Sit to Stand: Min assist                 Balance Overall balance assessment: Needs assistance Sitting-balance support: No upper extremity supported, Feet supported Sitting balance-Leahy Scale: Fair     Standing balance support: Bilateral upper extremity supported, During functional activity, Reliant on assistive device for balance Standing balance-Leahy Scale: Poor                             ADL either performed or assessed with clinical judgement   ADL Overall ADL's : Needs assistance/impaired     Grooming: Cueing for sequencing;Wash/dry hands;Supervision/safety                   Armed forces technical officer: Minimal IT trainer and Hygiene: Supervision/safety Toileting - Clothing Manipulation Details (indicate cue type and reason): pericare completed sitting and standing for thoroughness.     Functional mobility during ADLs: Cueing for sequencing;Cueing for safety;Minimal assistance      Extremity/Trunk Assessment Upper Extremity Assessment Upper Extremity Assessment: Generalized weakness  Lower Extremity Assessment Lower Extremity Assessment: Generalized weakness   Cervical / Trunk  Assessment Cervical / Trunk Assessment: Normal    Vision Baseline Vision/History: 1 Wears glasses Patient Visual Report: No change from baseline            Cognition Arousal/Alertness: Awake/alert Behavior During Therapy: WFL for tasks assessed/performed, Restless Overall Cognitive Status: No family/caregiver present to determine baseline cognitive functioning                                                     Pertinent Vitals/ Pain       Pain Assessment Pain Assessment: No/denies pain   Frequency  Min 2X/week        Progress Toward Goals  OT Goals(current goals can now be found in the care plan section)  Progress towards OT goals: Progressing toward goals  Acute Rehab OT Goals Patient Stated Goal: none stated. OT Goal Formulation: Patient unable to participate in goal setting Time For Goal Achievement: 06/08/22 Potential to Achieve Goals: Good  Plan Discharge plan remains appropriate    Co-evaluation      Reason for Co-Treatment: For patient/therapist safety PT goals addressed during session: Mobility/safety with mobility OT goals addressed during session: ADL's and self-care      AM-PAC OT "6 Clicks" Daily Activity     Outcome Measure   Help from another person eating meals?: None Help from another person taking care of personal grooming?: A Little Help from another person toileting, which includes using toliet, bedpan, or urinal?: A Little Help from another person bathing (including washing, rinsing, drying)?: A Little Help from another person to put on and taking off regular upper body clothing?: A Little Help from another person to put on and taking off regular lower body clothing?: A Little 6 Click Score: 19    End of Session Equipment Utilized During Treatment: Gait belt;Rolling walker (2 wheels)  OT Visit Diagnosis: Unsteadiness on feet (R26.81);Repeated falls (R29.6);History of falling (Z91.81);Muscle weakness  (generalized) (M62.81)   Activity Tolerance Patient tolerated treatment well   Patient Left in chair;with nursing/sitter in room   Nurse Communication Mobility status        Time: 2130-8657 OT Time Calculation (min): 8 min  Charges:      Tomasa Blase, OTS 05/27/2022, 2:32 PM

## 2022-05-27 NOTE — Consult Note (Signed)
[facility] Face-to-Face Psychiatry Consult   Reason for Consult: Consult for 66 year old woman in the hospital after multiple falls.  Question raised about "capacity" and patient's evident poor self-care Referring Physician:  Marylu Lund Patient Identification: Stephanie Skinner MRN:  [id number] Principal Diagnosis: Benzodiazepine abuse (HCC) Diagnosis:  Principal Problem:   Benzodiazepine abuse (HCC) Active Problems:   Alcohol abuse   Essential hypertension   ABLA (acute blood loss anemia)   Frequent falls   Malnutrition of moderate degree   Total Time spent with patient: 45 minutes  Subjective:   Stephanie Skinner is a 66 y.o. female patient admitted with "I fell".  HPI: Patient seen and chart reviewed.  66 year old woman in the hospital after multiple falls at home resulting in several injuries including facial bruising.  Concern raised that her falls are precipitated at least in part by her continued use of Xanax and other sedating medications and possibly alcohol use.  Patient was able to tell me where she was currently as well as the correct year and state.  She was able to tell me she was in the hospital because of a fall.  She said that the fall happened simply because she lost her balance.  She claimed to have no other thought about why she may have had several falls.  At the time I went to see her she was in the nursing station because she needed one-to-one supervision after having a fall out of bed this morning.  I asked her about that and she denied it having happened and became confused in her discussion.  Patient states that she does continue to take Xanax regularly which she says she takes for "panic attacks".  She minimizes alcohol use but eventually admits to drinking about 2 beers a day.  Denies other drug abuse and denies being prescribed other medicines although her drug screen is positive for several other controlled substances.  Patient stated she did not feel that she had a problem with  alcohol and did not have a problem with her pills.  When I asked her if she thought that following was a health risk she was able eventually to say that 1 could die from a fall but when I asked her whether she would be willing to stop her Xanax if someone suggested it was causing her falls she said she would not.  Patient seemed intermittently confused during the interview.  At times tangential.  She denied being depressed.  Denied suicidal or homicidal thought.  Did not show any sign of having hallucinations.  He was able to repeat 3 words immediately but remembered only one of them at about 2 minutes.  Past Psychiatric History: Patient has no previous hospitalizations no history of suicide attempts.  She sees an outpatient psychiatrist and is currently prescribed Xanax as well as Suboxone and also had recently received prescriptions for stimulants.  Risk to Self:   Risk to Others:   Prior Inpatient Therapy:   Prior Outpatient Therapy:    Past Medical History:  Past Medical History:  Diagnosis Date   Anemia 05/24/2022   Arthritis    COVID-19    ETOH abuse    Hepatitis C    pt states she has been cured   Hypertension    Seizures (HCC)     Past Surgical History:  Procedure Laterality Date   ABDOMINAL HYSTERECTOMY     "partial hysterectomy"   ELBOW SURGERY     ESOPHAGOGASTRODUODENOSCOPY (EGD) WITH PROPOFOL N/A 05/24/2022   Procedure:  ESOPHAGOGASTRODUODENOSCOPY (EGD) WITH PROPOFOL;  Surgeon: Lin Landsman, MD;  Location: [facility] ENDOSCOPY;  Service: Gastroenterology;  Laterality: N/A;   Family History:  Family History  Problem Relation Age of Onset   Diabetes Sister    Colon cancer Sister    Family Psychiatric  History: Unknown Social History:  Social History   Substance and Sexual Activity  Alcohol Use Yes   Comment: Pt states "couple glasses of wine a week"     Social History   Substance and Sexual Activity  Drug Use No    Social History   Socioeconomic History    Marital status: Divorced    Spouse name: Not on file   Number of children: Not on file   Years of education: Not on file   Highest education level: Not on file  Occupational History   Not on file  Tobacco Use   Smoking status: Every Day    Packs/day: 1.00    Types: Cigarettes   Smokeless tobacco: Never  Vaping Use   Vaping Use: Every day  Substance and Sexual Activity   Alcohol use: Yes    Comment: Pt states "couple glasses of wine a week"   Drug use: No   Sexual activity: Not on file  Other Topics Concern   Not on file  Social History Narrative   Not on file   Social Determinants of Health   Financial Resource Strain: Not on file  Food Insecurity: No Food Insecurity (05/24/2022)   Hunger Vital Sign    Worried About Running Out of Food in the Last Year: Never true    Ran Out of Food in the Last Year: Never true  Transportation Needs: No Transportation Needs (05/23/2022)   PRAPARE - Hydrologist (Medical): No    Lack of Transportation (Non-Medical): No  Physical Activity: Not on file  Stress: Not on file  Social Connections: Not on file   Additional Social History:    Allergies:   Allergies  Allergen Reactions   Codeine Nausea And Vomiting   Morphine And Related Other (See Comments)    seizures   Sulfa Antibiotics Nausea And Vomiting    Labs:  Results for orders placed or performed during the hospital encounter of 05/23/22 (from the past 48 hour(s))  Hemoglobin and hematocrit, blood     Status: Abnormal   Collection Time: 05/26/22  5:00 AM  Result Value Ref Range   Hemoglobin 8.7 (L) 12.0 - 15.0 g/dL   HCT 27.6 (L) 36.0 - 46.0 %    Comment: Performed at [facility], [address] [place], [place] [address]    Current Facility-Administered Medications  Medication Dose Route Frequency Provider Last Rate Last Admin   dextrose 5 %-0.9 % sodium chloride infusion   Intravenous Continuous Nolberto Hanlon, MD 75 mL/hr at  05/27/22 0606 New Bag at 05/27/22 0606   feeding supplement (ENSURE ENLIVE / ENSURE PLUS) liquid 237 mL  237 mL Oral TID BM Nolberto Hanlon, MD   237 mL at 04/09/47 1856   folic acid (FOLVITE) tablet 1 mg  1 mg Oral Daily Agbata, Tochukwu, MD   1 mg at 05/27/22 0905   LORazepam (ATIVAN) injection 1 mg  1 mg Intramuscular Q4H PRN Athena Masse, MD   1 mg at 05/27/22 1310   multivitamin with minerals tablet 1 tablet  1 tablet Oral Daily Agbata, Tochukwu, MD   1 tablet at 05/27/22 0905   ondansetron (ZOFRAN) tablet 4 mg  4 mg Oral Q6H PRN Agbata, Tochukwu, MD       Or   ondansetron (ZOFRAN) injection 4 mg  4 mg Intravenous Q6H PRN Agbata, Tochukwu, MD       pantoprazole (PROTONIX) EC tablet 40 mg  40 mg Oral BID Sharen Hones, RPH   40 mg at 05/27/22 9528   sodium chloride flush (NS) 0.9 % injection 10-40 mL  10-40 mL Intracatheter Q12H Lynn Ito, MD       sodium chloride flush (NS) 0.9 % injection 10-40 mL  10-40 mL Intracatheter PRN Lynn Ito, MD       thiamine (VITAMIN B1) tablet 100 mg  100 mg Oral Daily Agbata, Tochukwu, MD   100 mg at 05/27/22 4132   Or   thiamine (VITAMIN B1) injection 100 mg  100 mg Intravenous Daily Agbata, Tochukwu, MD   100 mg at 05/23/22 1224    Musculoskeletal: Strength & Muscle Tone: decreased Gait & Station: unable to stand Patient leans: Backward            Psychiatric Specialty Exam:  Presentation  General Appearance: No data recorded Eye Contact:No data recorded Speech:No data recorded Speech Volume:No data recorded Handedness:No data recorded  Mood and Affect  Mood:No data recorded Affect:No data recorded  Thought Process  Thought Processes:No data recorded Descriptions of Associations:No data recorded Orientation:No data recorded Thought Content:No data recorded History of Schizophrenia/Schizoaffective disorder:No data recorded Duration of Psychotic Symptoms:No data recorded Hallucinations:No data recorded Ideas of  Reference:No data recorded Suicidal Thoughts:No data recorded Homicidal Thoughts:No data recorded  Sensorium  Memory:No data recorded Judgment:No data recorded Insight:No data recorded  Executive Functions  Concentration:No data recorded Attention Span:No data recorded Recall:No data recorded Fund of Knowledge:No data recorded Language:No data recorded  Psychomotor Activity  Psychomotor Activity:No data recorded  Assets  Assets:No data recorded  Sleep  Sleep:No data recorded  Physical Exam: Physical Exam Vitals and nursing note reviewed.  Constitutional:      Appearance: Normal appearance.  HENT:     Head: Normocephalic and atraumatic.     Mouth/Throat:     Pharynx: Oropharynx is clear.  Eyes:     Pupils: Pupils are equal, round, and reactive to light.  Cardiovascular:     Rate and Rhythm: Normal rate and regular rhythm.  Pulmonary:     Effort: Pulmonary effort is normal.     Breath sounds: Normal breath sounds.  Abdominal:     General: Abdomen is flat.     Palpations: Abdomen is soft.  Musculoskeletal:        General: Normal range of motion.  Skin:    General: Skin is warm and dry.  Neurological:     General: No focal deficit present.     Mental Status: She is alert. Mental status is at baseline.  Psychiatric:        Attention and Perception: She is inattentive.        Mood and Affect: Mood normal. Affect is blunt.        Speech: Speech is delayed.        Behavior: Behavior is slowed.        Thought Content: Thought content normal.        Cognition and Memory: Cognition is impaired. Memory is impaired. She exhibits impaired recent memory.        Judgment: Judgment is inappropriate.    Review of Systems  Constitutional: Negative.   HENT: Negative.    Eyes: Negative.   Respiratory: Negative.  Cardiovascular: Negative.   Gastrointestinal: Negative.   Musculoskeletal: Negative.   Skin: Negative.   Neurological: Negative.   Psychiatric/Behavioral:  Negative.     Blood pressure 137/70, pulse 97, temperature 97.9 F (36.6 C), temperature source Oral, resp. rate 16, height 5\' 6"  (1.676 m), weight 65.5 kg, SpO2 96 %. Body mass index is 23.31 kg/m.  Treatment Plan Summary: Medication management and Plan without a more specific question it is difficult to address capacity in the most definite sense.  Nevertheless it seems clear that the patient has confusion currently at least part of which is probably from withdrawal although some of which may be due to her chronic use of sedatives and some could be due to dementia or other processes although it is hard to be clear until she were completely detoxed.  Patient does not seem to have any sense that her medication could be worsening her health problems and her falls and does not even have a clear understanding of the danger of the falls.  Intercurrent condition I do not think she has the ability to make reasoned decisions about whether or not to stop her medication although the same could be said for most people in the grips of substance use.  I am unclear what the goal would be.  If the daughter wants to take custody she needs to go to court.  I suspect she would have a pretty good case in demonstrating that the patient is not doing a good job caring for herself although that would be up to a judge.  Patient is not in any condition to go to any kind of substance abuse facility currently.  Ideally it would be good to taper her off of the Xanax and then write a letter to her outpatient psychiatrist strongly requesting that the medication be discontinued however this may take a while and be best done with supervision at home.  If the live-in boyfriend could be enrolled in the plan it might work better.  There is no indication currently to add any psychiatric medicine to what the patient is taking.  Also no indication for transfer at this time to the inpatient geriatric psychiatry unit  Disposition: Patient does  not meet criteria for psychiatric inpatient admission. Supportive therapy provided about ongoing stressors.  , MD 05/27/2022 5:00 PM

## 2022-05-27 NOTE — Progress Notes (Signed)
Physical Therapy Treatment Patient Details Name: Stephanie Skinner MRN: [id number] DOB: February 12, 1956 Today's Date: 05/27/2022   History of Present Illness Pt is a 66 y.o. female presenting to hospital 9/28 after multiple falls over past week including 3 falls within past 24 hours; c/o facial pain to L aspect of her face, R shoulder and LE pain; also generalized weakness.  Imaging showing comminuted and displaced fractures involving the anterior and posterolateral walls of the left maxillary sinus; comminuted and displaced fracture of the left zygoma; Left lateral orbital wall fracture and left orbital floor fracture.  Pt admitted with acute blood loss anemia (Hgb 5.7), alcohol abuse, benzodiazepine abuse, and frequent falls.  PMH includes benzodiazepine dependence, alcohol dependence, htn, recent hospital discharge 9/8 (acute metabolic encephalopathy secondary to alcohol and benzodiazepine withdrawal complicated by delirium and COVID-19 infection), Hepatitis C, seizures, and elbow surgery.    PT Comments    Pt at nurses station for observation.  Pt alert and orientated and cleared by RN for session.  Ativan given and awaiting head CT as a precaution. She is able to stand with min guard x 2.  She is able to walk about 100' with RW and min guard/assist +2 for walker guidance and overall safety due to recent fall.  While balance and overall safety deficits are evident pt overall does better than yesterdays session.  She remains unsafe to ambulate without +1 assist at this time.  OT arrives during session and resumes care in bathroom.     Recommendations for follow up therapy are one component of a multi-disciplinary discharge planning process, led by the attending physician.  Recommendations may be updated based on patient status, additional functional criteria and insurance authorization.  Follow Up Recommendations  Skilled nursing-short term rehab (<3 hours/day)     Assistance Recommended at Discharge  Frequent or constant Supervision/Assistance  Patient can return home with the following A little help with walking and/or transfers;A little help with bathing/dressing/bathroom;Assistance with cooking/housework;Direct supervision/assist for medications management;Direct supervision/assist for financial management;Assist for transportation;Help with stairs or ramp for entrance   Equipment Recommendations  BSC/3in1;Rolling walker (2 wheels)    Recommendations for Other Services       Precautions / Restrictions Precautions Precautions: Fall Restrictions Weight Bearing Restrictions: No     Mobility  Bed Mobility               General bed mobility comments: in recliner before and after    Transfers Overall transfer level: Needs assistance Equipment used: Rolling walker (2 wheels) Transfers: Sit to/from Stand Sit to Stand: Min assist, +2 safety/equipment                Ambulation/Gait Ambulation/Gait assistance: Min guard, Min assist, +2 safety/equipment Gait Distance (Feet): 100 Feet Assistive device: Rolling walker (2 wheels) Gait Pattern/deviations: Step-through pattern, Decreased step length - right, Decreased step length - left Gait velocity: decreased     General Gait Details: assist to guide walker and proper hand placements   Stairs             Wheelchair Mobility    Modified Rankin (Stroke Patients Only)       Balance Overall balance assessment: Needs assistance Sitting-balance support: No upper extremity supported, Feet supported Sitting balance-Leahy Scale: Fair Sitting balance - Comments: steady static sitting   Standing balance support: Bilateral upper extremity supported, During functional activity, Reliant on assistive device for balance Standing balance-Leahy Scale: Poor Standing balance comment: assist required for standing balance with dynamic activities  Cognition Arousal/Alertness:  Awake/alert Behavior During Therapy: WFL for tasks assessed/performed, Restless Overall Cognitive Status: No family/caregiver present to determine baseline cognitive functioning                                 General Comments: overall generally cooperative.  wanting to go home        Exercises      General Comments        Pertinent Vitals/Pain Pain Assessment Pain Assessment: No/denies pain    Home Living                          Prior Function            PT Goals (current goals can now be found in the care plan section) Progress towards PT goals: Progressing toward goals    Frequency    Min 2X/week      PT Plan         AM-PAC PT "6 Clicks" Mobility   Outcome Measure  Help needed turning from your back to your side while in a flat bed without using bedrails?: None Help needed moving from lying on your back to sitting on the side of a flat bed without using bedrails?: A Little Help needed moving to and from a bed to a chair (including a wheelchair)?: A Little Help needed standing up from a chair using your arms (e.g., wheelchair or bedside chair)?: A Little Help needed to walk in hospital room?: A Little Help needed climbing 3-5 steps with a railing? : A Lot 6 Click Score: 18    End of Session Equipment Utilized During Treatment: Gait belt Activity Tolerance: Patient tolerated treatment well Patient left: Other (comment) (bathroom with OT)   PT Visit Diagnosis: Unsteadiness on feet (R26.81);Muscle weakness (generalized) (M62.81);History of falling (Z91.81);Other abnormalities of gait and mobility (R26.89)     Time: [id number] PT Time Calculation (min) (ACUTE ONLY): 13 min  Charges:  $Gait Training: 8-22 mins                   Chesley Noon, PTA 05/27/22, 2:09 PM

## 2022-05-28 DIAGNOSIS — F1310 Sedative, hypnotic or anxiolytic abuse, uncomplicated: Secondary | ICD-10-CM | POA: Diagnosis not present

## 2022-05-28 MED ORDER — LORAZEPAM 2 MG/ML IJ SOLN
1.0000 mg | INTRAMUSCULAR | Status: DC | PRN
  Administered 2022-05-28 – 2022-06-02 (×8): 1 mg via INTRAVENOUS
  Filled 2022-05-28 (×8): qty 1

## 2022-05-28 MED ORDER — ORAL CARE MOUTH RINSE: 15.0000 mL | OROMUCOSAL | Status: DC | PRN

## 2022-05-28 NOTE — Progress Notes (Signed)
Physical Therapy Treatment Patient Details Name: Stephanie Skinner MRN: [id number] DOB: [birth year] Today's Date: 05/28/2022   History of Present Illness Pt is a 66 y.o. female presenting to hospital 9/28 after multiple falls over past week including 3 falls within past 24 hours; c/o facial pain to L aspect of her face, R shoulder and LE pain; also generalized weakness.  Imaging showing comminuted and displaced fractures involving the anterior and posterolateral walls of the left maxillary sinus; comminuted and displaced fracture of the left zygoma; Left lateral orbital wall fracture and left orbital floor fracture.  Pt admitted with acute blood loss anemia (Hgb 5.7), alcohol abuse, benzodiazepine abuse, and frequent falls.  PMH includes benzodiazepine dependence, alcohol dependence, htn, recent hospital discharge 9/8 (acute metabolic encephalopathy secondary to alcohol and benzodiazepine withdrawal complicated by delirium and COVID-19 infection), Hepatitis C, seizures, and elbow surgery.    PT Comments    Pt up in chair with sitter in room.  Recently given ativan for agitation.  She is able to get up and walk x 3 laps on unit with RW and min a x 1 before fatigue.  Generally unsteady with gait and needs hands on assist +1.  Sitter in and assists with recliner follow as a precaution but not needed.  She is ok to walk with nursing staff with gait belt, RW and +1 assist in hallway as needed.  Gait belt in room for use.  She is able to void on commode and perform her own self care.  Remained up with sitter in room.  Continues to have generally poor balance and safety awareness.  Unsafe to walk unassisted at this time.   Recommendations for follow up therapy are one component of a multi-disciplinary discharge planning process, led by the attending physician.  Recommendations may be updated based on patient status, additional functional criteria and insurance authorization.  Follow Up Recommendations  Skilled  nursing-short term rehab (<3 hours/day)     Assistance Recommended at Discharge Frequent or constant Supervision/Assistance  Patient can return home with the following A little help with walking and/or transfers;A little help with bathing/dressing/bathroom;Assistance with cooking/housework;Direct supervision/assist for medications management;Direct supervision/assist for financial management;Assist for transportation;Help with stairs or ramp for entrance   Equipment Recommendations  BSC/3in1;Rolling walker (2 wheels)    Recommendations for Other Services       Precautions / Restrictions Precautions Precautions: Fall Restrictions Weight Bearing Restrictions: No     Mobility  Bed Mobility               General bed mobility comments: in recliner before and after    Transfers Overall transfer level: Needs assistance Equipment used: Rolling walker (2 wheels) Transfers: Sit to/from Stand Sit to Stand: Min assist                Ambulation/Gait Ambulation/Gait assistance: Min Chemical engineer (Feet): 450 Feet Assistive device: Rolling walker (2 wheels) Gait Pattern/deviations: Step-through pattern, Decreased step length - right, Decreased step length - left Gait velocity: decreased     General Gait Details: assist to guide walker and proper hand placements   Stairs             Wheelchair Mobility    Modified Rankin (Stroke Patients Only)       Balance Overall balance assessment: Needs assistance Sitting-balance support: No upper extremity supported, Feet supported Sitting balance-Leahy Scale: Fair     Standing balance support: Bilateral upper extremity supported, During functional activity, Reliant on assistive device for  balance Standing balance-Leahy Scale: Poor                              Cognition Arousal/Alertness: Awake/alert Behavior During Therapy: WFL for tasks assessed/performed, Agitated Overall Cognitive Status:  No family/caregiver present to determine baseline cognitive functioning                                 General Comments: overall generally cooperative.  wanting to go home        Exercises Other Exercises Other Exercises: to commode to void    General Comments        Pertinent Vitals/Pain Pain Assessment Pain Assessment: No/denies pain    Home Living                          Prior Function            PT Goals (current goals can now be found in the care plan section) Progress towards PT goals: Progressing toward goals    Frequency    Min 2X/week      PT Plan Current plan remains appropriate    Co-evaluation              AM-PAC PT "6 Clicks" Mobility   Outcome Measure  Help needed turning from your back to your side while in a flat bed without using bedrails?: None Help needed moving from lying on your back to sitting on the side of a flat bed without using bedrails?: None Help needed moving to and from a bed to a chair (including a wheelchair)?: A Little Help needed standing up from a chair using your arms (e.g., wheelchair or bedside chair)?: A Little Help needed to walk in hospital room?: A Little Help needed climbing 3-5 steps with a railing? : A Lot 6 Click Score: 19    End of Session Equipment Utilized During Treatment: Gait belt Activity Tolerance: Patient tolerated treatment well   Nurse Communication: Mobility status;Precautions;Other (comment) PT Visit Diagnosis: Unsteadiness on feet (R26.81);Muscle weakness (generalized) (M62.81);History of falling (Z91.81);Other abnormalities of gait and mobility (R26.89)     Time: 7564-3329 PT Time Calculation (min) (ACUTE ONLY): 21 min  Charges:  $Gait Training: 8-22 mins                   Chesley Noon, PTA 05/28/22, 9:39 AM

## 2022-05-28 NOTE — Anesthesia Postprocedure Evaluation (Signed)
Anesthesia Post Note  Patient: Stephanie Skinner  Procedure(s) Performed: ESOPHAGOGASTRODUODENOSCOPY (EGD) WITH PROPOFOL  Patient location during evaluation: PACU Anesthesia Type: General Level of consciousness: awake and alert Pain management: pain level controlled Vital Signs Assessment: post-procedure vital signs reviewed and stable Respiratory status: spontaneous breathing, nonlabored ventilation, respiratory function stable and patient connected to nasal cannula oxygen Cardiovascular status: blood pressure returned to baseline and stable Postop Assessment: no apparent nausea or vomiting Anesthetic complications: no   No notable events documented.   Last Vitals:  Vitals:   05/28/22 0727 05/28/22 1248  BP: 129/61 132/65  Pulse: 89 97  Resp: 18 14  Temp: 36.6 C 36.6 C  SpO2: 92% 96%    Last Pain:  Vitals:   05/28/22 1248  TempSrc:   PainSc: 0-No pain                 Molli Barrows

## 2022-05-28 NOTE — Progress Notes (Signed)
PROGRESS NOTE    Stephanie Skinner  RUE:[id number] DOB: 1956-07-24 DOA: 05/23/2022 PCP: Charlies Constable, MD    Brief Narrative:   Stephanie Skinner is a 66 y.o. female with medical history significant for benzodiazepine dependence, alcohol dependence, hypertension, recent hospital discharge on 05/03/22 following admission for acute metabolic encephalopathy secondary to alcohol and benzodiazepine withdrawal complicated by delirium and COVID-19 infection. Patient was discharged home in stable condition and according to her roommate she started taking the 'pills" again.  He notes that she has been increasingly lethargic and has had frequent falls.  He called EMS on the day of admission after she fell landing on her face.  She complained of pain in her right shoulder as well as in her legs.  She received a dose of Toradol in the ER and during my evaluation appears very lethargic and speech is not clear.  Per her roommate she still drinks alcohol and her last use was about 3 days ago. Labs show a hemoglobin of 5.7 compared to baseline of 12.4.  Per ER doc her stool is heme positive 2 units of packed RBC have been ordered She will be admitted to the hospital for further evaluation  9/29 s/p EGD, no acute findings. No further w/u per GI. 9/30 scoring on ciwa protocal.  10/1 scoring 21 on CIWA protocol.  This happened this AM.  In mittens. Now on my exam awakens and responds appropriately, but falls back a sleep. Told her breakfast is here and she wants to eat. 10/2 patient was awake and alert this a.m. for me.  Answered my questions.  This is probably the most awake and I am seeing her this week.  After I had seen her she was scoring 14 on the CIWA protocol.  Later patient was found on the floor confused and combative.  Patient apparently denied hitting her head but were not sure.  CT of the head ordered.  Daughter updated. Psychiatry consulted per daughters request 10/3 still scoring on CIWA protocal.     Consultants:  GI, psychiatry  Procedures: EGD  Antimicrobials:      Subjective: Denies sob, cp, abd pain  Objective: Vitals:   05/27/22 1545 05/27/22 2010 05/28/22 0421 05/28/22 0727  BP: 137/70 (!) 162/80 (!) 141/62 129/61  Pulse: 97 (!) 101 (!) 103 89  Resp: 16 18 18 18   Temp: 97.9 F (36.6 C) 98.2 F (36.8 C) 98.4 F (36.9 C) 97.8 F (36.6 C)  TempSrc: Oral Oral Oral   SpO2: 96% 96% 94% 92%  Weight:      Height:        Intake/Output Summary (Last 24 hours) at 05/28/2022 0834 Last data filed at 05/27/2022 1746 Gross per 24 hour  Intake 120 ml  Output 500 ml  Net -380 ml   Filed Weights   05/23/22 0811 05/26/22 2145  Weight: 61.2 kg 65.5 kg    Examination: Calm, NAD Cta no w/r Reg s1/s2 no gallop Soft benign +bs No edema Aaoxox3  Mood and affect appropriate in current setting    Data Reviewed: I have personally reviewed following labs and imaging studies  CBC: Recent Labs  Lab 05/23/22 0815 05/23/22 1703 05/24/22 0139 05/24/22 1006 05/25/22 0534 05/26/22 0500  WBC 8.7  --   --   --  7.9  --   NEUTROABS 6.8  --   --   --   --   --   HGB 5.7* 8.1* 8.3* 9.6* 8.7* 8.7*  HCT 18.6* 24.9*  25.4* 29.9* 27.1* 27.6*  MCV 100.5*  --   --   --  94.1  --   PLT 234  --   --   --  236  --    Basic Metabolic Panel: Recent Labs  Lab 05/23/22 0815 05/24/22 0542  NA 135 140  K 4.3 3.8  CL 101 111  CO2 26 27  GLUCOSE 119* 118*  BUN 13 10  CREATININE 0.61 0.48  CALCIUM 8.1* 7.8*   GFR: Estimated Creatinine Clearance: 63.9 mL/min (by C-G formula based on SCr of 0.48 mg/dL). Liver Function Tests: Recent Labs  Lab 05/23/22 0815  AST 34  ALT 18  ALKPHOS 58  BILITOT 2.1*  PROT 6.4*  ALBUMIN 3.1*   No results for input(s): "LIPASE", "AMYLASE" in the last 168 hours. Recent Labs  Lab 05/23/22 1103  AMMONIA 15   Coagulation Profile: Recent Labs  Lab 05/23/22 1103  INR 1.3*   Cardiac Enzymes: No results for input(s): "CKTOTAL", "CKMB",  "CKMBINDEX", "TROPONINI" in the last 168 hours. BNP (last 3 results) No results for input(s): "PROBNP" in the last 8760 hours. HbA1C: No results for input(s): "HGBA1C" in the last 72 hours. CBG: No results for input(s): "GLUCAP" in the last 168 hours. Lipid Profile: No results for input(s): "CHOL", "HDL", "LDLCALC", "TRIG", "CHOLHDL", "LDLDIRECT" in the last 72 hours. Thyroid Function Tests: No results for input(s): "TSH", "T4TOTAL", "FREET4", "T3FREE", "THYROIDAB" in the last 72 hours. Anemia Panel: No results for input(s): "VITAMINB12", "FOLATE", "FERRITIN", "TIBC", "IRON", "RETICCTPCT" in the last 72 hours.  Sepsis Labs: No results for input(s): "PROCALCITON", "LATICACIDVEN" in the last 168 hours.  No results found for this or any previous visit (from the past 240 hour(s)).       Radiology Studies: CT HEAD WO CONTRAST ( )  Result Date: 05/27/2022 CLINICAL DATA:  Trauma EXAM: CT HEAD WITHOUT CONTRAST TECHNIQUE: Contiguous axial images were obtained from the base of the skull through the vertex without intravenous contrast. RADIATION DOSE REDUCTION: This exam was performed according to the departmental dose-optimization program which includes automated exposure control, adjustment of the mA and/or kV according to patient size and/or use of iterative reconstruction technique. COMPARISON:  Facial bone CT 05/24/2019 FINDINGS: Brain: No evidence of acute infarction, hemorrhage, extra-axial collection or mass lesion/mass effect. Unchanged size and shape of the ventricular system which is slightly prominent, likely due to the degree of volume loss. Vascular: No hyperdense vessel or unexpected calcification. Skull: Normal. Redemonstrated comminuted fractures involving the left maxillary sinus, left zygoma, as well as the orbital floor on the left. There is mild soft tissue stranding along the right parietal scalp. Sinuses/Orbits: Complete opacification of the left maxillary sinus with complex  fractures involving the anterior, and lateral walls of the left maxillary sinus. There is also a fracture of the lateral orbital wall in the left with a small hematoma just lateral to the left lateral rectus muscle body Other: None. IMPRESSION: 1.  No evidence of acute traumatic injury. 2. Redemonstrated extensive facial bone fractures involving the left orbit, maxillary sinus, and the left zygoma, as described on prior CT facial bone. Electronically Signed   By: Lorenza Cambridge M.D.   On: 05/27/2022 15:55        Scheduled Meds:  ALPRAZolam  0.5 mg Oral Q8H   feeding supplement  237 mL Oral TID BM   folic acid  1 mg Oral Daily   multivitamin with minerals  1 tablet Oral Daily   pantoprazole  40 mg Oral  BID   sodium chloride flush  10-40 mL Intracatheter Q12H   thiamine  100 mg Oral Daily   Or   thiamine  100 mg Intravenous Daily   Continuous Infusions:  dextrose 5 % and 0.9% NaCl 75 mL/hr at 05/27/22 0606    Assessment & Plan:   Principal Problem:   Benzodiazepine abuse ([place]) Active Problems:   ABLA (acute blood loss anemia)   Alcohol abuse   Essential hypertension   Frequent falls   Malnutrition of moderate degree   ABLA (acute blood loss anemia) Patient presents to the ER for evaluation of a fall and noted to have a hemoglobin of 5.7, at her baseline of 12.4 She has had occasional black stools and admits to NSAID use. Transfused 2 units of packed RBC 9/29 s/p EGD -unremarkable,  10/3 h/h remains stable    Alcohol abuse Xanax abuse On folate and thiamine Vitamin B12 elevated 10/3 still scoring on ciwa protocal Psych consulted input was appreciated-There is no indication currently to add any psychiatric medicine to what the patient is taking.  Also no indication for transfer at this time to the inpatient geriatric psychiatry unit. PLEASE SEE FULL REPORT        Essential hypertension Continue holding BP meds  10/2 stable     Frequent falls Patient has had  several falls following her hospital discharge and was brought in for evaluation after a fall.   She is noted to have comminuted and displaced fractures involving the anterior and posterolateral walls of left maxillary sinus. The sinus is filled with hematoma.  She also has a comminuted and displaced fracture of the left zygoma. The lateral orbital wall is fractured along with the orbital floor. No evidence of entrapment of the inferior rectus muscle. The orbital roof is intact and the medial orbital wall is intact. Pain control Fall precautions 10/2 was found on the floor today.  Will obtain CT of the head as we are not sure she patient's had her head.  Patient denies hitting her head 10/3sitter at bedside.  PT/OT rec. SNF    Benzodiazepine abuse ([place]) Patient has a history of benzodiazepine dependence/abuse and was recently hospitalized for benzodiazepine withdrawal with delirium. She was seen by psychiatry during that hospitalization and was discharged on Klonopin and citalopram but according to her daughter she was started back on Xanax by her psychiatrist. Patient is at risk of going through withdrawal again. Per family request will have psych evaluate pt's competency  Per daughter she took "16 xanax in just few days". 10/3 psych started pt on xanax 0.5 mg tid. For withdrawl           DVT prophylaxis: scd Code Status:full Family Communication: none at bedside Disposition Plan:  Status is: Inpatient Remains inpatient appropriate because: iv treatment. Still scoring on ciwa protocal. Needs snf        LOS: 5 days   Time spent: 35 min    Nolberto Hanlon, MD [facility] Pager [phone]  If 7PM-7AM, please contact night-coverage 05/28/2022, 8:34 AM

## 2022-05-29 DIAGNOSIS — S0240FA Zygomatic fracture, left side, initial encounter for closed fracture: Secondary | ICD-10-CM | POA: Diagnosis not present

## 2022-05-29 DIAGNOSIS — S0285XA Fracture of orbit, unspecified, initial encounter for closed fracture: Secondary | ICD-10-CM | POA: Diagnosis not present

## 2022-05-29 DIAGNOSIS — D649 Anemia, unspecified: Secondary | ICD-10-CM | POA: Diagnosis not present

## 2022-05-29 DIAGNOSIS — K922 Gastrointestinal hemorrhage, unspecified: Secondary | ICD-10-CM

## 2022-05-29 DIAGNOSIS — E440 Moderate protein-calorie malnutrition: Secondary | ICD-10-CM

## 2022-05-29 DIAGNOSIS — I10 Essential (primary) hypertension: Secondary | ICD-10-CM

## 2022-05-29 DIAGNOSIS — R296 Repeated falls: Secondary | ICD-10-CM

## 2022-05-29 MED ORDER — SODIUM CHLORIDE 0.9% FLUSH: 10.0000 mL | INTRAVENOUS | Status: DC | PRN

## 2022-05-29 MED ORDER — SODIUM CHLORIDE 0.9% FLUSH
10.0000 mL | Freq: Two times a day (BID) | INTRAVENOUS | Status: DC
  Administered 2022-05-29 – 2022-06-02 (×9): 10 mL

## 2022-05-29 MED ORDER — LORAZEPAM 2 MG/ML IJ SOLN
0.5000 mg | Freq: Once | INTRAMUSCULAR | Status: AC
  Administered 2022-05-29: 0.5 mg via INTRAVENOUS
  Filled 2022-05-29: qty 1

## 2022-05-29 NOTE — Progress Notes (Signed)
Nutrition Follow-up  DOCUMENTATION CODES:   Non-severe (moderate) malnutrition in context of acute illness/injury  INTERVENTION:   -Continue Ensure Enlive po TID, each supplement provides 350 kcal and 20 grams of protein -Continue MVI with minerals daily  NUTRITION DIAGNOSIS:   Moderate Malnutrition related to acute illness (COVID-19) as evidenced by mild fat depletion, mild muscle depletion, moderate muscle depletion.  Ongoing  GOAL:   Patient will meet greater than or equal to 90% of their needs  Progressing   MONITOR:   PO intake, Supplement acceptance, Diet advancement  REASON FOR ASSESSMENT:   Malnutrition Screening Tool    ASSESSMENT:   Pt with medical history significant for benzodiazepine dependence, alcohol dependence, hypertension, recent hospital discharge on 05/03/22 following admission for acute metabolic encephalopathy secondary to alcohol and benzodiazepine withdrawal complicated by delirium and COVID-19 infection.  9/29- EGD- WDL 9/30- CIWA protocol started  Reviewed I/O's: +240 ml x 24 hours and +5.6 L since admission   Pt remains on CIWA; anxiety and tremors improving.   Pt more alert compared to previous visit. Pt receiving nursing care at time of visit. Pt refused breakfast tray, but requesting Ensure. Noted meal completions 25-50%. Pt drinking Ensure supplements consistently.   Psychiatry has seen pt; pt does not meet criteria for psychiatric admission.   Therapies recommend SNF placement at discharge.   Medications reviewed and include folic acid and vitamin B-1.   Labs reviewed: CBGS: 99.   Diet Order:   Diet Order             Diet regular Room service appropriate? Yes; Fluid consistency: Thin  Diet effective now                   EDUCATION NEEDS:   No education needs have been identified at this time  Skin:  Skin Assessment: Reviewed RN Assessment  Last BM:  05/28/22  Height:   Ht Readings from Last 1 Encounters:   05/23/22 5\' 6"  (1.676 m)    Weight:   Wt Readings from Last 1 Encounters:  05/26/22 65.5 kg    Ideal Body Weight:  59.1 kg  BMI:  Body mass index is 23.31 kg/m.  Estimated Nutritional Needs:   Kcal:  8841-6606  Protein:  90-105 grams  Fluid:  > 1.7 L    Loistine Chance, RD, LDN, [place] Registered Dietitian II Certified Diabetes Care and Education Specialist Please refer to [facility] for RD and/or RD on-call/weekend/after hours pager

## 2022-05-29 NOTE — Progress Notes (Signed)
       CROSS COVER NOTE  NAME: Stephanie Skinner MRN: [id number] DOB : [birth year]    Date of Service   05/29/2022   HPI/Events of Note   Nausea refractory to zofran Prolonged qt interval  Interventions   Assessment/Plan: Ativan 0.5mg  X X      This document was prepared using Dragon voice recognition software and may include unintentional dictation errors.  [name] DNP, MHA, FNP-BC Nurse Practitioner [facility] Pager [phone]

## 2022-05-29 NOTE — Hospital Course (Addendum)
Stephanie Skinner is a 66 y.o. female with medical history significant for benzodiazepine dependence, alcohol dependence, hypertension, recent hospital discharge on 05/03/22 following admission for acute metabolic encephalopathy secondary to alcohol and benzodiazepine withdrawal complicated by delirium and COVID-19 infection. Patient was discharged home in stable condition and according to her roommate she started taking the 'pills" again.  He notes that she has been increasingly lethargic and has had frequent falls.  He called EMS on the day of admission after she fell landing on her face. 09/28: To ED/admitted to hospitalist service.  Significantly anemic, Hgb 5.7.  Heme positive stool per ER physician.  Received 2 units PRBC. 9/29 s/p EGD, no acute findings. No further w/u per GI. 9/30 scoring on ciwa protocal.  10/1 scoring 21 on CIWA protocol.  This happened this AM.  In mittens. Now on exam awakens and responds appropriately, but falls back asleep. Told her breakfast is here and she wants to eat. 10/2 patient was awake and alert this a.m.  Answered questions. "This is probably the most awake and I am seeing her this week.  After I had seen her she was scoring 14 on the CIWA protocol." Later patient was found on the floor confused and combative.  Patient apparently denied hitting her head but were not sure.  CT of the head ordered.  Daughter updated. Psychiatry consulted per daughters request -not meeting criteria for psychiatric inpatient admission, ideally would plan to taper off of Xanax entirely, suggest discontinuation per her outpatient physician.  10/3 still scoring on CIWA protocol.  10/4: on CIWA, tremor and anxiety seem to be improving. 10/5-10/7: remains stable, working on SNF placement  10/8: pt a bit more alert but still intermittently confused. [facility] TOC notes - pt refusing SNF, family undecided / may consider HH. Awaiting this determination from family prior to dispo  10/9: [place]. I attempted to call son 06/03/22 2:49 PM and went to voicemail. No further attempts will be made.     Consultants:  GI Psychiatry   Procedures: EGD 05/24/2022      ASSESSMENT & PLAN:   Principal Problem:   Benzodiazepine abuse ([place]) Active Problems:   ABLA (acute blood loss anemia)   Alcohol abuse   Essential hypertension   Frequent falls   Malnutrition of moderate degree  ABLA (acute blood loss anemia) Patient presented to the ER for evaluation of a fall and noted to have a hemoglobin of 5.7, vs her baseline of 12.4 She has had occasional black stools and admits to NSAID use. Transfused 2 units of packed RBC 9/29 s/p EGD - unremarkable H/H has been stable, 05/30/22 is 9.6 Serial CBC   Alcohol abuse Xanax abuse On folate and thiamine Vitamin B12 elevated 10/3 still scoring on ciwa protocol, better 10/4 into 10/5 and 10/6 Psych consulted input was appreciated - There is no indication currently to add any psychiatric medicine to what the patient is taking.  Also no indication for transfer at this time to the inpatient geriatric psychiatry unit. PLEASE SEE FULL CONSULT REPORT   Benzodiazepine abuse/dependence ([facility]) Patient has a history of benzodiazepine dependence/abuse and was recently hospitalized for benzodiazepine withdrawal with delirium. She was seen by psychiatry during that hospitalization and was discharged on Klonopin and citalopram but according to her daughter she was started back on Xanax by her outpatient psychiatrist. Patient is at risk of going through withdrawal again. Per daughter she took "6 xanax in just few days".  10/3 psych started pt on xanax 0.5 mg tid. Advise slow taper downward outpatient    Essential hypertension Held home BP meds amlodipine Started propranolol today given BP creeping up - BB may help prevent headaches, help w/ anxiety    Frequent falls Facial fractures: Left maxillary sinus, left zygoma,  left lateral orbital wall/orbital floor 10/2 was found on the floor. 10/3-10/6 sitter at bedside. Pain control Fall precautions PT/OT rec. SNF  Headache Mild, requesting fiorinal Ordered toradol yesterday  Monitor

## 2022-05-29 NOTE — Progress Notes (Signed)
PROGRESS NOTE    Stephanie Skinner   LYY:[id number] DOB: October 30, 1955  DOA: 05/23/2022 Date of Service: 05/29/22 PCP: Charlies Constable, MD     Brief Narrative / Hospital Course:  Stephanie Skinner is a 66 y.o. female with medical history significant for benzodiazepine dependence, alcohol dependence, hypertension, recent hospital discharge on 05/03/22 following admission for acute metabolic encephalopathy secondary to alcohol and benzodiazepine withdrawal complicated by delirium and COVID-19 infection. Patient was discharged home in stable condition and according to her roommate she started taking the 'pills" again.  He notes that she has been increasingly lethargic and has had frequent falls.  He called EMS on the day of admission after she fell landing on her face. 09/28: To ED/admitted to hospitalist service.  Significantly anemic, Hgb 5.7.  Heme positive stool per ER physician.  Received 2 units PRBC. 9/29 s/p EGD, no acute findings. No further w/u per GI. 9/30 scoring on ciwa protocal.  10/1 scoring 21 on CIWA protocol.  This happened this AM.  In mittens. Now on exam awakens and responds appropriately, but falls back asleep. Told her breakfast is here and she wants to eat. 10/2 patient was awake and alert this a.m.  Answered questions. "This is probably the most awake and I am seeing her this week.  After I had seen her she was scoring 14 on the CIWA protocol." Later patient was found on the floor confused and combative.  Patient apparently denied hitting her head but were not sure.  CT of the head ordered.  Daughter updated. Psychiatry consulted per daughters request -not meeting criteria for psychiatric inpatient admission, ideally would plan to taper off of Xanax entirely, suggest discontinuation per her outpatient physician.  10/3 still scoring on CIWA protocol.  10/4: on CIWA, tremor and anxiety seem to be improving.    Consultants:  GI Psychiatry   Procedures: EGD  05/24/2022      ASSESSMENT & PLAN:   Principal Problem:   Benzodiazepine abuse (HCC) Active Problems:   ABLA (acute blood loss anemia)   Alcohol abuse   Essential hypertension   Frequent falls   Malnutrition of moderate degree  ABLA (acute blood loss anemia) Patient presents to the ER for evaluation of a fall and noted to have a hemoglobin of 5.7, at her baseline of 12.4 She has had occasional black stools and admits to NSAID use. Transfused 2 units of packed RBC 9/29 s/p EGD -unremarkable,    Alcohol abuse Xanax abuse On folate and thiamine Vitamin B12 elevated 10/3 still scoring on ciwa protocal Psych consulted input was appreciated - There is no indication currently to add any psychiatric medicine to what the patient is taking.  Also no indication for transfer at this time to the inpatient geriatric psychiatry unit. PLEASE SEE FULL REPORT   Essential hypertension Continue holding BP meds    Frequent falls Facial fractures: Left maxillary sinus, left zygoma, left lateral orbital wall/orbital floor Pain control Fall precautions 10/2 was found on the floor. 10/3-10/4 sitter at bedside.  PT/OT rec. SNF    Benzodiazepine abuse/dependence (HCC) Patient has a history of benzodiazepine dependence/abuse and was recently hospitalized for benzodiazepine withdrawal with delirium. She was seen by psychiatry during that hospitalization and was discharged on Klonopin and citalopram but according to her daughter she was started back on Xanax by her psychiatrist. Patient is at risk of going through withdrawal again. Per daughter she took "67 xanax in just few days". 10/3 psych started pt on xanax 0.5 mg  tid. For withdrawl     DVT prophylaxis: Holding pharmacologic prophylaxis in light of recent anemia Pertinent IV fluids/nutrition: Discontinued IV fluids today, encourage p.o. intake Central lines / invasive devices: None  Code Status: Full code Family Communication: Spoke  with daughter over the phone 05/29/22 2:37 PM   Disposition: Remains inpatient, [facility], patient's daughter is requesting some additional information about legal guardianship.  I did let the daughter know that, since she is the patient's only child, if patient lacks capacity then automatically healthcare team would defer medical decisions to her as the legal next of kin  Barriers to discharge / significant pending items: Patient is approaching medical stability, will pursue SNF placement but anticipate may be discharge appropriate in the next couple of days.             Subjective:  Patient reports no concerns.  She is not oriented to place, she is oriented to year but not to month/day.  When asked why she is in the hospital she states "to birth a baby."       Objective:  Vitals:   05/28/22 1931 05/29/22 0500 05/29/22 0733 05/29/22 1235  BP: (!) 154/75 (!) 146/70 (!) 146/69 (!) 128/57  Pulse: 87 92 98 92  Resp: 17 16 14 16   Temp: 97.6 F (36.4 C) 97.8 F (36.6 C) 97.7 F (36.5 C) 97.6 F (36.4 C)  TempSrc: Oral Oral    SpO2: 99% 97% 94% 98%  Weight:      Height:        Intake/Output Summary (Last 24 hours) at 05/29/2022 1439 Last data filed at 05/28/2022 1900 Gross per 24 hour  Intake 240 ml  Output --  Net 240 ml   Filed Weights   05/23/22 0811 05/26/22 2145  Weight: 61.2 kg 65.5 kg    Examination:  Constitutional:  VS as above General Appearance: alert, frail, NAD Respiratory: Normal respiratory effort No wheeze No rhonchi No rales Cardiovascular: S1/S2 normal No murmur No rub/gallop auscultated No lower extremity edema Gastrointestinal: No tenderness Musculoskeletal:  No clubbing/cyanosis of digits Symmetrical movement in all extremities Neurological: No cranial nerve deficit on limited exam Alert Psychiatric: Poor judgment/insight Calm mood and affect       Scheduled Medications:    ALPRAZolam  0.5 mg Oral Q8H   feeding supplement  237 mL Oral TID BM   folic acid  1 mg Oral Daily   multivitamin with minerals  1 tablet Oral Daily   pantoprazole  40 mg Oral BID   sodium chloride flush  10-40 mL Intracatheter Q12H   sodium chloride flush  10-40 mL Intracatheter Q12H   thiamine  100 mg Oral Daily   Or   thiamine  100 mg Intravenous Daily    Continuous Infusions:   PRN Medications:  LORazepam, ondansetron **OR** ondansetron (ZOFRAN) IV, mouth rinse, risperiDONE, sodium chloride flush, sodium chloride flush  Antimicrobials:  Anti-infectives (From admission, onward)    None       Data Reviewed: I have personally reviewed following labs and imaging studies  CBC: Recent Labs  Lab 05/23/22 0815 05/23/22 1703 05/24/22 0139 05/24/22 1006 05/25/22 0534 05/26/22 0500  WBC 8.7  --   --   --  7.9  --   NEUTROABS 6.8  --   --   --   --   --   HGB 5.7* 8.1* 8.3* 9.6* 8.7* 8.7*  HCT 18.6* 24.9* 25.4* 29.9* 27.1* 27.6*  MCV  100.5*  --   --   --  94.1  --   PLT 234  --   --   --  236  --    Basic Metabolic Panel: Recent Labs  Lab 05/23/22 0815 05/24/22 0542  NA 135 140  K 4.3 3.8  CL 101 111  CO2 26 27  GLUCOSE 119* 118*  BUN 13 10  CREATININE 0.61 0.48  CALCIUM 8.1* 7.8*   GFR: Estimated Creatinine Clearance: 63.9 mL/min (by C-G formula based on SCr of 0.48 mg/dL). Liver Function Tests: Recent Labs  Lab 05/23/22 0815  AST 34  ALT 18  ALKPHOS 58  BILITOT 2.1*  PROT 6.4*  ALBUMIN 3.1*   No results for input(s): "LIPASE", "AMYLASE" in the last 168 hours. Recent Labs  Lab 05/23/22 1103  AMMONIA 15   Coagulation Profile: Recent Labs  Lab 05/23/22 1103  INR 1.3*   Cardiac Enzymes: No results for input(s): "CKTOTAL", "CKMB", "CKMBINDEX", "TROPONINI" in the last 168 hours. BNP (last 3 results) No results for input(s): "PROBNP" in the last 8760 hours. HbA1C: No results for input(s): "HGBA1C" in the last 72 hours. CBG: No results for  input(s): "GLUCAP" in the last 168 hours. Lipid Profile: No results for input(s): "CHOL", "HDL", "LDLCALC", "TRIG", "CHOLHDL", "LDLDIRECT" in the last 72 hours. Thyroid Function Tests: No results for input(s): "TSH", "T4TOTAL", "FREET4", "T3FREE", "THYROIDAB" in the last 72 hours. Anemia Panel: No results for input(s): "VITAMINB12", "FOLATE", "FERRITIN", "TIBC", "IRON", "RETICCTPCT" in the last 72 hours. Urine analysis:    Component Value Date/Time   COLORURINE AMBER (A) 05/23/2022 1400   APPEARANCEUR HAZY (A) 05/23/2022 1400   LABSPEC 1.016 05/23/2022 1400   PHURINE 6.0 05/23/2022 1400   GLUCOSEU NEGATIVE 05/23/2022 1400   HGBUR NEGATIVE 05/23/2022 1400   BILIRUBINUR NEGATIVE 05/23/2022 1400   KETONESUR 20 (A) 05/23/2022 1400   PROTEINUR NEGATIVE 05/23/2022 1400   NITRITE NEGATIVE 05/23/2022 1400   LEUKOCYTESUR NEGATIVE 05/23/2022 1400   Sepsis Labs: @LABRCNTIP (procalcitonin:4,lacticidven:4)  No results found for this or any previous visit (from the past 240 hour(s)).       Radiology Studies: Abdomen Limited RUQ (LIVER/GB)  Result Date: 05/23/2022 CLINICAL DATA:  A 66 year old female presents with elevated liver enzymes. EXAM: ULTRASOUND ABDOMEN LIMITED RIGHT UPPER QUADRANT COMPARISON:  None Available. FINDINGS: Gallbladder: No gallstones or wall thickening visualized. No sonographic Murphy sign noted by sonographer. Common bile duct: Diameter: 5.5 mm Liver: No focal lesion identified. Mild parenchymal increased echogenicity. Portal vein is patent on color Doppler imaging with normal direction of blood flow towards the liver. Other: Limited study due to patient body habitus. Query RIGHT pleural effusion versus artifact. IMPRESSION: No acute biliary process. Limited evaluation of the liver with suggestion of mild hepatic steatosis. Query RIGHT-sided pleural effusion versus artifact. Electronically Signed   By: 78 M.D.   On: 05/23/2022 19:07   DG Shoulder  Right  Result Date: 05/23/2022 CLINICAL DATA:  Status post fall, severe pain EXAM: RIGHT SHOULDER - 2+ VIEW COMPARISON:  None Available. FINDINGS: Generalized osteopenia. No acute fracture or dislocation. No aggressive osseous lesion. Normal alignment. Soft tissue are unremarkable. No radiopaque foreign body or soft tissue emphysema. IMPRESSION: No acute osseous injury of the right shoulder. Electronically Signed   By: 05/25/2022 M.D.   On: 05/23/2022 09:11   CT Head Wo Contrast  Result Date: 05/23/2022 CLINICAL DATA:  05/25/2022 6 days ago. EXAM: CT HEAD WITHOUT CONTRAST CT MAXILLOFACIAL WITHOUT CONTRAST CT CERVICAL SPINE WITHOUT CONTRAST  TECHNIQUE: Multidetector CT imaging of the head, cervical spine, and maxillofacial structures were performed using the standard protocol without intravenous contrast. Multiplanar CT image reconstructions of the cervical spine and maxillofacial structures were also generated. RADIATION DOSE REDUCTION: This exam was performed according to the departmental dose-optimization program which includes automated exposure control, adjustment of the mA and/or kV according to patient size and/or use of iterative reconstruction technique. COMPARISON:  Head CT 04/27/2022. Facial CT and cervical spine CT 02/12/2022 FINDINGS: CT HEAD FINDINGS Brain: Stable age advanced cerebral atrophy, ventriculomegaly and periventricular white matter disease. No extra-axial fluid collections are identified. No CT findings for acute hemispheric infarction or intracranial hemorrhage. No mass lesions. The brainstem and cerebellum are normal. Vascular: Stable vascular calcifications. No aneurysm hyperdense vessels. Skull: No acute skull fracture or bone lesions. Left-sided facial bone fractures noted. Other: No scalp lesions or scalp hematoma. CT MAXILLOFACIAL FINDINGS Osseous: Comminuted and displaced fractures involving the anterior and posterolateral walls of left maxillary sinus. The sinus is filled with  hematoma. Comminuted and displaced fracture of the left zygoma. The lateral orbital wall is fractured along with the orbital floor. No evidence of entrapment of the inferior rectus muscle. The orbital roof is intact and the medial orbital wall is intact. No acute nasal bone fracture. No right-sided facial bone fractures. The mandibular condyles are normally located. No mandible fracture. Orbits: Left lateral orbital wall fracture and left orbital floor fracture. The left globe is intact. No intraorbital hematoma. Sinuses: The left maxillary sinus is filled with hematoma. The other paranasal sinuses and mastoid air cells are clear. Soft tissues: No significant findings. CT CERVICAL SPINE FINDINGS Alignment: Normal Skull base and vertebrae: No acute fracture. No primary bone lesion or focal pathologic process. Stable sclerotic lesion in the C4 vertebral body. Remote T3 fracture. Soft tissues and spinal canal: No prevertebral fluid or swelling. No visible canal hematoma. Disc levels: Degenerative cervical spondylosis with multilevel disc disease and facet disease but no large disc protrusions or significant canal compromise. There is multilevel foraminal stenosis due to uncinate spurring and facet disease. Upper chest: The lung apices are grossly clear. Other: Severe age advanced bilateral carotid artery calcifications. IMPRESSION: 1. Stable age advanced cerebral atrophy, ventriculomegaly and periventricular white matter disease. 2. No acute intracranial findings or skull fracture. 3. Comminuted and displaced fractures involving the anterior and posterolateral walls of the left maxillary sinus. 4. Comminuted and displaced fracture of the left zygoma. 5. Left lateral orbital wall fracture and left orbital floor fracture. No evidence of entrapment of the inferior rectus muscle. 6. No acute cervical spine fracture. 7. Degenerative cervical spondylosis with multilevel disc disease and facet disease but no large disc  protrusions or significant canal compromise. 8. Severe age advanced bilateral carotid artery calcifications. Electronically Signed   By: Rudie Meyer M.D.   On: 05/23/2022 09:08   CT Cervical Spine Wo Contrast  Result Date: 05/23/2022 CLINICAL DATA:  Larey Seat 6 days ago. EXAM: CT HEAD WITHOUT CONTRAST CT MAXILLOFACIAL WITHOUT CONTRAST CT CERVICAL SPINE WITHOUT CONTRAST TECHNIQUE: Multidetector CT imaging of the head, cervical spine, and maxillofacial structures were performed using the standard protocol without intravenous contrast. Multiplanar CT image reconstructions of the cervical spine and maxillofacial structures were also generated. RADIATION DOSE REDUCTION: This exam was performed according to the departmental dose-optimization program which includes automated exposure control, adjustment of the mA and/or kV according to patient size and/or use of iterative reconstruction technique. COMPARISON:  Head CT 04/27/2022. Facial CT and cervical spine CT 02/12/2022  FINDINGS: CT HEAD FINDINGS Brain: Stable age advanced cerebral atrophy, ventriculomegaly and periventricular white matter disease. No extra-axial fluid collections are identified. No CT findings for acute hemispheric infarction or intracranial hemorrhage. No mass lesions. The brainstem and cerebellum are normal. Vascular: Stable vascular calcifications. No aneurysm hyperdense vessels. Skull: No acute skull fracture or bone lesions. Left-sided facial bone fractures noted. Other: No scalp lesions or scalp hematoma. CT MAXILLOFACIAL FINDINGS Osseous: Comminuted and displaced fractures involving the anterior and posterolateral walls of left maxillary sinus. The sinus is filled with hematoma. Comminuted and displaced fracture of the left zygoma. The lateral orbital wall is fractured along with the orbital floor. No evidence of entrapment of the inferior rectus muscle. The orbital roof is intact and the medial orbital wall is intact. No acute nasal bone  fracture. No right-sided facial bone fractures. The mandibular condyles are normally located. No mandible fracture. Orbits: Left lateral orbital wall fracture and left orbital floor fracture. The left globe is intact. No intraorbital hematoma. Sinuses: The left maxillary sinus is filled with hematoma. The other paranasal sinuses and mastoid air cells are clear. Soft tissues: No significant findings. CT CERVICAL SPINE FINDINGS Alignment: Normal Skull base and vertebrae: No acute fracture. No primary bone lesion or focal pathologic process. Stable sclerotic lesion in the C4 vertebral body. Remote T3 fracture. Soft tissues and spinal canal: No prevertebral fluid or swelling. No visible canal hematoma. Disc levels: Degenerative cervical spondylosis with multilevel disc disease and facet disease but no large disc protrusions or significant canal compromise. There is multilevel foraminal stenosis due to uncinate spurring and facet disease. Upper chest: The lung apices are grossly clear. Other: Severe age advanced bilateral carotid artery calcifications. IMPRESSION: 1. Stable age advanced cerebral atrophy, ventriculomegaly and periventricular white matter disease. 2. No acute intracranial findings or skull fracture. 3. Comminuted and displaced fractures involving the anterior and posterolateral walls of the left maxillary sinus. 4. Comminuted and displaced fracture of the left zygoma. 5. Left lateral orbital wall fracture and left orbital floor fracture. No evidence of entrapment of the inferior rectus muscle. 6. No acute cervical spine fracture. 7. Degenerative cervical spondylosis with multilevel disc disease and facet disease but no large disc protrusions or significant canal compromise. 8. Severe age advanced bilateral carotid artery calcifications. Electronically Signed   By: Marijo Sanes M.D.   On: 05/23/2022 09:08   CT Maxillofacial WO CM  Result Date: 05/23/2022 CLINICAL DATA:  [place] 6 days ago. EXAM: CT HEAD  WITHOUT CONTRAST CT MAXILLOFACIAL WITHOUT CONTRAST CT CERVICAL SPINE WITHOUT CONTRAST TECHNIQUE: Multidetector CT imaging of the head, cervical spine, and maxillofacial structures were performed using the standard protocol without intravenous contrast. Multiplanar CT image reconstructions of the cervical spine and maxillofacial structures were also generated. RADIATION DOSE REDUCTION: This exam was performed according to the departmental dose-optimization program which includes automated exposure control, adjustment of the mA and/or kV according to patient size and/or use of iterative reconstruction technique. COMPARISON:  Head CT 04/27/2022. Facial CT and cervical spine CT 02/12/2022 FINDINGS: CT HEAD FINDINGS Brain: Stable age advanced cerebral atrophy, ventriculomegaly and periventricular white matter disease. No extra-axial fluid collections are identified. No CT findings for acute hemispheric infarction or intracranial hemorrhage. No mass lesions. The brainstem and cerebellum are normal. Vascular: Stable vascular calcifications. No aneurysm hyperdense vessels. Skull: No acute skull fracture or bone lesions. Left-sided facial bone fractures noted. Other: No scalp lesions or scalp hematoma. CT MAXILLOFACIAL FINDINGS Osseous: Comminuted and displaced fractures involving the anterior and posterolateral  walls of left maxillary sinus. The sinus is filled with hematoma. Comminuted and displaced fracture of the left zygoma. The lateral orbital wall is fractured along with the orbital floor. No evidence of entrapment of the inferior rectus muscle. The orbital roof is intact and the medial orbital wall is intact. No acute nasal bone fracture. No right-sided facial bone fractures. The mandibular condyles are normally located. No mandible fracture. Orbits: Left lateral orbital wall fracture and left orbital floor fracture. The left globe is intact. No intraorbital hematoma. Sinuses: The left maxillary sinus is filled with  hematoma. The other paranasal sinuses and mastoid air cells are clear. Soft tissues: No significant findings. CT CERVICAL SPINE FINDINGS Alignment: Normal Skull base and vertebrae: No acute fracture. No primary bone lesion or focal pathologic process. Stable sclerotic lesion in the C4 vertebral body. Remote T3 fracture. Soft tissues and spinal canal: No prevertebral fluid or swelling. No visible canal hematoma. Disc levels: Degenerative cervical spondylosis with multilevel disc disease and facet disease but no large disc protrusions or significant canal compromise. There is multilevel foraminal stenosis due to uncinate spurring and facet disease. Upper chest: The lung apices are grossly clear. Other: Severe age advanced bilateral carotid artery calcifications. IMPRESSION: 1. Stable age advanced cerebral atrophy, ventriculomegaly and periventricular white matter disease. 2. No acute intracranial findings or skull fracture. 3. Comminuted and displaced fractures involving the anterior and posterolateral walls of the left maxillary sinus. 4. Comminuted and displaced fracture of the left zygoma. 5. Left lateral orbital wall fracture and left orbital floor fracture. No evidence of entrapment of the inferior rectus muscle. 6. No acute cervical spine fracture. 7. Degenerative cervical spondylosis with multilevel disc disease and facet disease but no large disc protrusions or significant canal compromise. 8. Severe age advanced bilateral carotid artery calcifications. Electronically Signed   By: Rudie Meyer M.D.   On: 05/23/2022 09:08            LOS: 6 days      Sunnie Nielsen, DO [facility] 05/29/2022, 2:39 PM   Staff may message me via secure chat in Epic  but this may not receive immediate response,  please page for urgent matters!  If 7PM-7AM, please contact night-coverage [email or web address]  Dictation software was used to generate the above note. Typos may occur and escape review, as  with typed/written notes. Please contact Dr Lyn Hollingshead directly for clarity if needed.

## 2022-05-30 DIAGNOSIS — S0240FA Zygomatic fracture, left side, initial encounter for closed fracture: Secondary | ICD-10-CM | POA: Diagnosis not present

## 2022-05-30 DIAGNOSIS — S0285XA Fracture of orbit, unspecified, initial encounter for closed fracture: Secondary | ICD-10-CM | POA: Diagnosis not present

## 2022-05-30 DIAGNOSIS — D649 Anemia, unspecified: Secondary | ICD-10-CM | POA: Diagnosis not present

## 2022-05-30 DIAGNOSIS — K922 Gastrointestinal hemorrhage, unspecified: Secondary | ICD-10-CM | POA: Diagnosis not present

## 2022-05-30 LAB — BASIC METABOLIC PANEL
Anion gap: 6 (ref 5–15)
BUN: 8 mg/dL (ref 8–23)
CO2: 26 mmol/L (ref 22–32)
Calcium: 8.5 mg/dL — ABNORMAL LOW (ref 8.9–10.3)
Chloride: 102 mmol/L (ref 98–111)
Creatinine, Ser: 0.39 mg/dL — ABNORMAL LOW (ref 0.44–1.00)
GFR, Estimated: >60 mL/min (ref >60)
Glucose, Bld: 151 mg/dL — ABNORMAL HIGH (ref 70–99)
Potassium: 4.3 mmol/L (ref 3.5–5.1)
Sodium: 134 mmol/L — ABNORMAL LOW (ref 135–145)

## 2022-05-30 LAB — CBC
HCT: 30.1 % — ABNORMAL LOW (ref 36.0–46.0)
Hemoglobin: 9.6 g/dL — ABNORMAL LOW (ref 12.0–15.0)
MCH: 29.5 pg (ref 26.0–34.0)
MCHC: 31.9 g/dL (ref 30.0–36.0)
MCV: 92.6 fL (ref 80.0–100.0)
Platelets: 316 10*3/uL (ref 150–400)
RBC: 3.25 MIL/uL — ABNORMAL LOW (ref 3.87–5.11)
RDW: 17 % — ABNORMAL HIGH (ref 11.5–15.5)
WBC: 8.4 10*3/uL (ref 4.0–10.5)
nRBC: 0 % (ref 0.0–0.2)

## 2022-05-30 MED ORDER — ACETAMINOPHEN 325 MG PO TABS
650.0000 mg | ORAL_TABLET | Freq: Four times a day (QID) | ORAL | Status: DC | PRN
  Administered 2022-05-30 – 2022-06-03 (×8): 650 mg via ORAL
  Filled 2022-05-30 (×10): qty 2

## 2022-05-30 NOTE — TOC Initial Note (Addendum)
Transition of Care [facility](TOC) - Initial/Assessment Note    Patient Details  Name: Stephanie Skinner MRN: [id number] Date of Birth: 03/03/1956  Transition of Care [facility](TOC) CM/SW Contact:    Gildardo GriffesAshley M Meloni Hinz, LCSW Phone Number: 05/30/2022, 1:42 PM  Clinical Narrative:                  CSW notes patient coming from home with roommate with limited support at home. Per patient's daughter and her husband they request SNF workup proceed based on PT recommendations, specifically they would like to look at facilities in [place]. Csw explained barriers in bed search include patient's extensive substance abuse history.   CSW has initiated bed search pending bed offers at this time.   CSW has reached out to facilities near [place] including:  [facility] [place] [facility] with Shanda BumpsJessica with Admissions at [phone]  [facility] with [place] with admissions at [phone]  [facility] [place] [facility] with admissions at [phone]  Expected Discharge Plan: Skilled Nursing Facility Barriers to Discharge: Continued Medical Work up   Patient Goals and CMS Choice Patient states their goals for this hospitalization and ongoing recovery are:: to go home CMS Medicare.gov Compare Post Acute Care list provided to:: Patient Represenative (must comment) (daughter) Choice offered to / list presented to : Patient  Expected Discharge Plan and Services Expected Discharge Plan: Skilled Nursing Facility                                              Prior Living Arrangements/Services   Lives with:: Roommate                   Activities of Daily Living Home Assistive Devices/Equipment: None ADL Screening (condition at time of admission) Patient's cognitive ability adequate to safely complete daily activities?: Yes Is the patient deaf or have difficulty hearing?: No Does the patient have difficulty seeing, even when wearing  glasses/contacts?: No Does the patient have difficulty concentrating, remembering, or making decisions?: No Patient able to express need for assistance with ADLs?: No Does the patient have difficulty dressing or bathing?: No Independently performs ADLs?: Yes (appropriate for developmental age) Does the patient have difficulty walking or climbing stairs?: No Weakness of Legs: None Weakness of Arms/Hands: None  Permission Sought/Granted                  Emotional Assessment              Admission diagnosis:  Symptomatic anemia [D64.9] Gastrointestinal hemorrhage, unspecified gastrointestinal hemorrhage type [K92.2] Closed fracture of left zygomatic arch, initial encounter (HCC) [S02.40FA] Fracture of left orbital wall (HCC) [S02.85XA] ABLA (acute blood loss anemia) [D62] Patient Active Problem List   Diagnosis Date Noted   Closed fracture of left zygomatic arch (HCC)    Fracture of left orbital wall (HCC)    Gastrointestinal hemorrhage    Malnutrition of moderate degree 05/24/2022   ABLA (acute blood loss anemia) 05/23/2022   Frequent falls 05/23/2022   Symptomatic anemia    COVID-19 virus infection    Benzodiazepine withdrawal with delirium (HCC)    AMS (altered mental status) 04/27/2022   Delirium 04/27/2022   Alcohol abuse 04/27/2022   Essential hypertension 04/27/2022   Hypokalemia 04/27/2022   Acute respiratory failure with hypoxia (HCC)  Generalized anxiety disorder 08/10/2017   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 [place] [address] Phone: [phone] Fax: [phone]     Social Determinants of Health (SDOH)  Interventions    Readmission Risk Interventions     No data to display

## 2022-05-30 NOTE — NC FL2 (Signed)
[place] LEVEL OF CARE SCREENING TOOL     IDENTIFICATION  Patient Name: Stephanie Skinner Birthdate: Oct 17, 1955 Sex: female Admission Date (Current Location): 05/23/2022  [facility] [place] Number:  Engineering geologist and Address:  [facility], [address], [place], [place] [address]      Provider Number: [id number]  Attending Physician Name and Address:  Emeterio Reeve, DO  Relative Name and Phone Number:  Marland Kitchen [phone]    Current Level of Care: Hospital Recommended Level of Care: [place] Prior Approval Number:    Date Approved/Denied:   PASRR Number: Manual review  Discharge Plan: SNF    Current Diagnoses: Patient Active Problem List   Diagnosis Date Noted   Closed fracture of left zygomatic arch ([place])    Fracture of left orbital wall (HCC)    Gastrointestinal hemorrhage    Malnutrition of moderate degree 05/24/2022   ABLA (acute blood loss anemia) 05/23/2022   Frequent falls 05/23/2022   Symptomatic anemia    COVID-19 virus infection    Benzodiazepine withdrawal with delirium ([place])    AMS (altered mental status) 04/27/2022   Delirium 04/27/2022   Alcohol abuse 04/27/2022   Essential hypertension 04/27/2022   Hypokalemia 04/27/2022   Acute respiratory failure with hypoxia (HCC)    Generalized anxiety disorder 08/10/2017   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   Opiate abuse, continuous ([place]) 08/10/2017   Dehydration 07/20/2017    Orientation RESPIRATION BLADDER Height & Weight     Self, Place  Normal Continent Weight: 144 lb 6.4 oz (65.5 kg) Height:  5\' 6"  (167.6 cm)  BEHAVIORAL SYMPTOMS/MOOD NEUROLOGICAL BOWEL NUTRITION STATUS   (Has had some anxiety)  (None) Continent Diet (Regular)  AMBULATORY STATUS COMMUNICATION OF NEEDS Skin   Limited Assist Verbally Skin abrasions, Bruising                       Personal Care Assistance Level of Assistance  Bathing, Feeding, Dressing  Bathing Assistance: Limited assistance Feeding assistance: Limited assistance Dressing Assistance: Limited assistance     Functional Limitations Info  Sight, Hearing, Speech Sight Info: Adequate Hearing Info: Adequate Speech Info: Impaired (Slurred/Dysarthria)    SPECIAL CARE FACTORS FREQUENCY  PT (By licensed PT), OT (By licensed OT)     PT Frequency: 5 x week OT Frequency: 5 x week            Contractures Contractures Info: Not present    Additional Factors Info  Code Status, Allergies Code Status Info: Full code Allergies Info: Codeine, Morphine and related, Sulfa antibiotics           Current Medications (05/30/2022):  This is the current hospital active medication list Current Facility-Administered Medications  Medication Dose Route Frequency Provider Last Rate Last Admin   ALPRAZolam Duanne Moron) tablet 0.5 mg  0.5 mg Oral Q8H Clapacs, John T, MD   0.5 mg at 05/30/22 0348   feeding supplement (ENSURE ENLIVE / ENSURE PLUS) liquid 237 mL  237 mL Oral TID BM Nolberto Hanlon, MD   237 mL at 62/83/66 2947   folic acid (FOLVITE) tablet 1 mg  1 mg Oral Daily Agbata, Tochukwu, MD   1 mg at 05/29/22 1026   LORazepam (ATIVAN) injection 1 mg  1 mg Intravenous Q4H PRN Foust, Katy L, NP   1 mg at 05/28/22 1741   multivitamin with minerals tablet 1 tablet  1 tablet Oral Daily Agbata, Tochukwu, MD   1 tablet at  05/29/22 1026   ondansetron (ZOFRAN) tablet 4 mg  4 mg Oral Q6H PRN Agbata, Tochukwu, MD       Or   ondansetron (ZOFRAN) injection 4 mg  4 mg Intravenous Q6H PRN Agbata, Tochukwu, MD   4 mg at 05/29/22 2022   Oral care mouth rinse  15 mL Mouth Rinse PRN Nolberto Hanlon, MD       pantoprazole (PROTONIX) EC tablet 40 mg  40 mg Oral BID Dorothe Pea, RPH   40 mg at 05/29/22 2022   risperiDONE (RISPERDAL M-TABS) disintegrating tablet 0.5 mg  0.5 mg Oral Q6H PRN Clapacs, John T, MD   0.5 mg at 05/29/22 2022   sodium chloride flush (NS) 0.9 % injection 10-40 mL  10-40 mL Intracatheter  Q12H Nolberto Hanlon, MD   10 mL at 05/28/22 2153   sodium chloride flush (NS) 0.9 % injection 10-40 mL  10-40 mL Intracatheter PRN Nolberto Hanlon, MD       sodium chloride flush (NS) 0.9 % injection 10-40 mL  10-40 mL Intracatheter Q12H Emeterio Reeve, DO   10 mL at 05/29/22 2316   sodium chloride flush (NS) 0.9 % injection 10-40 mL  10-40 mL Intracatheter PRN Emeterio Reeve, DO       thiamine (VITAMIN B1) tablet 100 mg  100 mg Oral Daily Agbata, Tochukwu, MD   100 mg at 05/29/22 1026   Or   thiamine (VITAMIN B1) injection 100 mg  100 mg Intravenous Daily Agbata, Tochukwu, MD   100 mg at 05/23/22 1224     Discharge Medications: Please see discharge summary for a list of discharge medications.  Relevant Imaging Results:  Relevant Lab Results:   Additional Information SS#: [id number]  Candie Chroman, LCSW

## 2022-05-30 NOTE — TOC CM/SW Note (Signed)
RE: Stephanie Skinner Date of Birth: 26-Jun-1956 Date: 05/30/2022   To Whom It May Concern:  Please be advised that the above-named patient will require a short-term nursing home stay - anticipated 30 days or less for rehabilitation and strengthening.  The plan is for return home.

## 2022-05-30 NOTE — Progress Notes (Signed)
Occupational Therapy Treatment Patient Details Name: LENISE JR MRN: [id number] DOB: Jun 10, 1956 Today's Date: 05/30/2022   History of present illness Pt is a 66 y.o. female presenting to hospital 9/28 after multiple falls over past week including 3 falls within past 24 hours; c/o facial pain to L aspect of her face, R shoulder and LE pain; also generalized weakness.  Imaging showing comminuted and displaced fractures involving the anterior and posterolateral walls of the left maxillary sinus; comminuted and displaced fracture of the left zygoma; Left lateral orbital wall fracture and left orbital floor fracture.  Pt admitted with acute blood loss anemia (Hgb 5.7), alcohol abuse, benzodiazepine abuse, and frequent falls.  PMH includes benzodiazepine dependence, alcohol dependence, htn, recent hospital discharge 9/8 (acute metabolic encephalopathy secondary to alcohol and benzodiazepine withdrawal complicated by delirium and COVID-19 infection), Hepatitis C, seizures, and elbow surgery.   OT comments  Patient asleep in bed upon arrival, not easily aroused. Warm cloth placed on her forehead to successfully alert patient. Patient agreeable to OT treatment. A&Ox3 (person, place, and situation). Patient required min guard/close supervision for bed mobility, transfers, and grooming tasks (oral care and washing face). Supervision for LB dressing tasks. Patient left in bed with with bed alarm set, nurse and sitter in room, call bell in reach, and all need met.    Recommendations for follow up therapy are one component of a multi-disciplinary discharge planning process, led by the attending physician.  Recommendations may be updated based on patient status, additional functional criteria and insurance authorization.    Follow Up Recommendations  Skilled nursing-short term rehab (<3 hours/day)    Assistance Recommended at Discharge Frequent or constant Supervision/Assistance  Patient can return home with the  following  A little help with walking and/or transfers;Assistance with cooking/housework;Direct supervision/assist for financial management;Direct supervision/assist for medications management;Help with stairs or ramp for entrance;Assist for transportation   Equipment Recommendations  Other (comment) (Defer to next venue of care.)       Precautions / Restrictions Precautions Precautions: Fall Restrictions Weight Bearing Restrictions: No       Mobility Bed Mobility Overal bed mobility: Needs Assistance Bed Mobility: Supine to Sit     Supine to sit: Supervision, HOB elevated Sit to supine: Supervision        Transfers Overall transfer level: Needs assistance   Transfers: Sit to/from Stand Sit to Stand: Min guard, Supervision                 Balance Overall balance assessment: Needs assistance Sitting-balance support: No upper extremity supported, Feet supported Sitting balance-Leahy Scale: Good     Standing balance support: No upper extremity supported, During functional activity Standing balance-Leahy Scale: Good                             ADL either performed or assessed with clinical judgement   ADL       Grooming: Wash/dry face;Oral care;Supervision/safety;Min guard               Lower Body Dressing: Supervision/safety               Functional mobility during ADLs: Min guard;Supervision/safety      Extremity/Trunk Assessment Upper Extremity Assessment Upper Extremity Assessment: Overall WFL for tasks assessed   Lower Extremity Assessment Lower Extremity Assessment: Overall WFL for tasks assessed        Vision Baseline Vision/History: 1 Wears glasses Patient Visual Report: No change from  baseline            Cognition Arousal/Alertness: Awake/alert Behavior During Therapy: WFL for tasks assessed/performed, Agitated Overall Cognitive Status: No family/caregiver present to determine baseline cognitive  functioning Area of Impairment: Orientation                 Orientation Level: Person, Place, Situation                                 Pertinent Vitals/ Pain       Pain Assessment Pain Assessment: No/denies pain   Frequency  Min 2X/week        Progress Toward Goals  OT Goals(current goals can now be found in the care plan section)  Progress towards OT goals: Progressing toward goals  Acute Rehab OT Goals Patient Stated Goal: to return home. OT Goal Formulation: With patient Time For Goal Achievement: 06/08/22 Potential to Achieve Goals: Good  Plan Discharge plan remains appropriate    AM-PAC OT "6 Clicks" Daily Activity     Outcome Measure     Help from another person taking care of personal grooming?: None Help from another person toileting, which includes using toliet, bedpan, or urinal?: A Little Help from another person bathing (including washing, rinsing, drying)?: A Little Help from another person to put on and taking off regular upper body clothing?: None Help from another person to put on and taking off regular lower body clothing?: A Little 6 Click Score: 17    End of Session Equipment Utilized During Treatment: Gait belt  OT Visit Diagnosis: Unsteadiness on feet (R26.81);Repeated falls (R29.6);History of falling (Z91.81);Muscle weakness (generalized) (M62.81)   Activity Tolerance Patient tolerated treatment well   Patient Left in bed;with call bell/phone within reach;with bed alarm set   Nurse Communication Mobility status        Time: 4715-9539 OT Time Calculation (min): 24 min  Charges: OT General Charges $OT Visit: 1 Visit OT Treatments $Self Care/Home Management : 23-37 mins    Faron Tudisco, OTS 05/30/2022, 4:32 PM

## 2022-05-30 NOTE — Care Management Important Message (Signed)
Important Message  Patient Details  Name: Stephanie Skinner MRN: [id number] Date of Birth: Nov 05, 1955   Medicare Important Message Given:  Yes     Dannette Barbara 05/30/2022, 12:45 PM

## 2022-05-30 NOTE — Progress Notes (Signed)
PROGRESS NOTE    Stephanie Skinner   MGQ:[id number] DOB: 05-20-1956  DOA: 05/23/2022 Date of Service: 05/30/22 PCP: Laurell Josephs, MD     Brief Narrative / Hospital Course:  Stephanie Skinner is a 66 y.o. female with medical history significant for benzodiazepine dependence, alcohol dependence, hypertension, recent hospital discharge on 05/03/22 following admission for acute metabolic encephalopathy secondary to alcohol and benzodiazepine withdrawal complicated by delirium and COVID-19 infection. Patient was discharged home in stable condition and according to her roommate she started taking the 'pills" again.  He notes that she has been increasingly lethargic and has had frequent falls.  He called EMS on the day of admission after she fell landing on her face. 09/28: To ED/admitted to hospitalist service.  Significantly anemic, Hgb 5.7.  Heme positive stool per ER physician.  Received 2 units PRBC. 9/29 s/p EGD, no acute findings. No further w/u per GI. 9/30 scoring on ciwa protocal.  10/1 scoring 21 on CIWA protocol.  This happened this AM.  In mittens. Now on exam awakens and responds appropriately, but falls back asleep. Told her breakfast is here and she wants to eat. 10/2 patient was awake and alert this a.m.  Answered questions. "This is probably the most awake and I am seeing her this week.  After I had seen her she was scoring 14 on the CIWA protocol." Later patient was found on the floor confused and combative.  Patient apparently denied hitting her head but were not sure.  CT of the head ordered.  Daughter updated. Psychiatry consulted per daughters request -not meeting criteria for psychiatric inpatient admission, ideally would plan to taper off of Xanax entirely, suggest discontinuation per her outpatient physician.  10/3 still scoring on CIWA protocol.  10/4: on CIWA, tremor and anxiety seem to be improving. 10/5: remains stable, working on SNF placement     Consultants:   GI Psychiatry   Procedures: EGD 05/24/2022      ASSESSMENT & PLAN:   Principal Problem:   Benzodiazepine abuse ([place]) Active Problems:   ABLA (acute blood loss anemia)   Alcohol abuse   Essential hypertension   Frequent falls   Malnutrition of moderate degree  ABLA (acute blood loss anemia) Patient presents to the ER for evaluation of a fall and noted to have a hemoglobin of 5.7, at her baseline of 12.4 She has had occasional black stools and admits to NSAID use. Transfused 2 units of packed RBC 9/29 s/p EGD -unremarkable   Alcohol abuse Xanax abuse On folate and thiamine Vitamin B12 elevated 10/3 still scoring on ciwa protocol, better 10/4 into 10/5 Psych consulted input was appreciated - There is no indication currently to add any psychiatric medicine to what the patient is taking.  Also no indication for transfer at this time to the inpatient geriatric psychiatry unit. PLEASE SEE FULL CONSULT REPORT   Essential hypertension Continue holding BP meds    Frequent falls Facial fractures: Left maxillary sinus, left zygoma, left lateral orbital wall/orbital floor Pain control Fall precautions 10/2 was found on the floor. 10/3-10/4 sitter at bedside.  PT/OT rec. SNF    Benzodiazepine abuse/dependence ([place]) Patient has a history of benzodiazepine dependence/abuse and was recently hospitalized for benzodiazepine withdrawal with delirium. She was seen by psychiatry during that hospitalization and was discharged on Klonopin and citalopram but according to her daughter she was started back on Xanax by her psychiatrist. Patient is at risk of going through withdrawal again. Per daughter she took "19 xanax  in just few days". 10/3 psych started pt on xanax 0.5 mg tid. For withdrawal     DVT prophylaxis: Holding pharmacologic prophylaxis in light of recent anemia Pertinent IV fluids/nutrition: Discontinued IV fluids today, encourage p.o. intake Central lines / invasive  devices: None  Code Status: Full code Family Communication: None at this time.   Disposition: Remains inpatient, anticipate discharge to SNF [facility] needs: SNF placement Barriers to discharge / significant pending items: Patient is approaching medical stability, will pursue SNF placement and anticipate should be discharge appropriate in the next couple of days.             Subjective:  Patient reports no concerns.  Denies pain.        Objective:  Vitals:   05/29/22 2340 05/30/22 0334 05/30/22 0804 05/30/22 1207  BP: 136/62 (!) 144/69 (!) 147/64 135/65  Pulse: 98 (!) 106 93 95  Resp: 18 18 19 19   Temp: 98.4 F (36.9 C) 98.6 F (37 C) 97.9 F (36.6 C) 97.7 F (36.5 C)  TempSrc:    Oral  SpO2: 92% 92% 92% 94%  Weight:      Height:        Intake/Output Summary (Last 24 hours) at 05/30/2022 1421 Last data filed at 05/30/2022 1052 Gross per 24 hour  Intake 0 ml  Output --  Net 0 ml   Filed Weights   05/23/22 0811 05/26/22 2145  Weight: 61.2 kg 65.5 kg    Examination:  Constitutional:  VS as above General Appearance: alert, frail, NAD Respiratory: Normal respiratory effort No wheeze No rhonchi No rales Cardiovascular: S1/S2 normal No murmur No rub/gallop auscultated No lower extremity edema Gastrointestinal: No tenderness Musculoskeletal:  No clubbing/cyanosis of digits Symmetrical movement in all extremities Neurological: No cranial nerve deficit on limited exam Alert Psychiatric: Poor judgment/insight Calm mood and affect       Scheduled Medications:   ALPRAZolam  0.5 mg Oral Q8H   feeding supplement  237 mL Oral TID BM   folic acid  1 mg Oral Daily   multivitamin with minerals  1 tablet Oral Daily   pantoprazole  40 mg Oral BID   sodium chloride flush  10-40 mL Intracatheter Q12H   sodium chloride flush  10-40 mL Intracatheter Q12H   thiamine  100 mg Oral Daily   Or   thiamine  100 mg Intravenous Daily    Continuous  Infusions:   PRN Medications:  LORazepam, ondansetron **OR** ondansetron (ZOFRAN) IV, mouth rinse, risperiDONE, sodium chloride flush, sodium chloride flush  Antimicrobials:  Anti-infectives (From admission, onward)    None       Data Reviewed: I have personally reviewed following labs and imaging studies  CBC: Recent Labs  Lab 05/24/22 0139 05/24/22 1006 05/25/22 0534 05/26/22 0500 05/30/22 0514  WBC  --   --  7.9  --  8.4  HGB 8.3* 9.6* 8.7* 8.7* 9.6*  HCT 25.4* 29.9* 27.1* 27.6* 30.1*  MCV  --   --  94.1  --  92.6  PLT  --   --  236  --  [id number]   Basic Metabolic Panel: Recent Labs  Lab 05/24/22 0542 05/30/22 0514  NA 140 134*  K 3.8 4.3  CL 111 102  CO2 27 26  GLUCOSE 118* 151*  BUN 10 8  CREATININE 0.48 0.39*  CALCIUM 7.8* 8.5*   GFR: Estimated Creatinine Clearance: 63.9 mL/min (A) (by C-G formula based on SCr of 0.39 mg/dL (L)). Liver Function Tests: No  results for input(s): "AST", "ALT", "ALKPHOS", "BILITOT", "PROT", "ALBUMIN" in the last 168 hours.  No results for input(s): "LIPASE", "AMYLASE" in the last 168 hours. No results for input(s): "AMMONIA" in the last 168 hours. Coagulation Profile: No results for input(s): "INR", "PROTIME" in the last 168 hours. Cardiac Enzymes: No results for input(s): "CKTOTAL", "CKMB", "CKMBINDEX", "TROPONINI" in the last 168 hours. BNP (last 3 results) No results for input(s): "PROBNP" in the last 8760 hours. HbA1C: No results for input(s): "HGBA1C" in the last 72 hours. CBG: No results for input(s): "GLUCAP" in the last 168 hours. Lipid Profile: No results for input(s): "CHOL", "HDL", "LDLCALC", "TRIG", "CHOLHDL", "LDLDIRECT" in the last 72 hours. Thyroid Function Tests: No results for input(s): "TSH", "T4TOTAL", "FREET4", "T3FREE", "THYROIDAB" in the last 72 hours. Anemia Panel: No results for input(s): "VITAMINB12", "FOLATE", "FERRITIN", "TIBC", "IRON", "RETICCTPCT" in the last 72 hours. Urine analysis:     Component Value Date/Time   COLORURINE AMBER (A) 05/23/2022 1400   APPEARANCEUR HAZY (A) 05/23/2022 1400   LABSPEC 1.016 05/23/2022 1400   PHURINE 6.0 05/23/2022 1400   GLUCOSEU NEGATIVE 05/23/2022 1400   HGBUR NEGATIVE 05/23/2022 1400   BILIRUBINUR NEGATIVE 05/23/2022 1400   KETONESUR 20 (A) 05/23/2022 1400   PROTEINUR NEGATIVE 05/23/2022 1400   NITRITE NEGATIVE 05/23/2022 1400   LEUKOCYTESUR NEGATIVE 05/23/2022 1400   Sepsis Labs: @LABRCNTIP (procalcitonin:4,lacticidven:4)  No results found for this or any previous visit (from the past 240 hour(s)).       Radiology Studies: US Abdomen Limited RUQ (LIVER/GB)  Result Date: 05/23/2022 CLINICAL DATA:  A 66 year old female presents with elevated liver enzymes. EXAM: ULTRASOUND ABDOMEN LIMITED RIGHT UPPER QUADRANT COMPARISON:  None Available. FINDINGS: Gallbladder: No gallstones or wall thickening visualized. No sonographic Murphy sign noted by sonographer. Common bile duct: Diameter: 5.5 mm Liver: No focal lesion identified. Mild parenchymal increased echogenicity. Portal vein is patent on color Doppler imaging with normal direction of blood flow towards the liver. Other: Limited study due to patient body habitus. Query RIGHT pleural effusion versus artifact. IMPRESSION: No acute biliary process. Limited evaluation of the liver with suggestion of mild hepatic steatosis. Query RIGHT-sided pleural effusion versus artifact. Electronically Signed   By: Zetta Bills M.D.   On: 05/23/2022 19:07   DG Shoulder Right  Result Date: 05/23/2022 CLINICAL DATA:  Status post fall, severe pain EXAM: RIGHT SHOULDER - 2+ VIEW COMPARISON:  None Available. FINDINGS: Generalized osteopenia. No acute fracture or dislocation. No aggressive osseous lesion. Normal alignment. Soft tissue are unremarkable. No radiopaque foreign body or soft tissue emphysema. IMPRESSION: No acute osseous injury of the right shoulder. Electronically Signed   By: Kathreen Devoid M.D.    On: 05/23/2022 09:11   CT Head Wo Contrast  Result Date: 05/23/2022 CLINICAL DATA:  [place] 6 days ago. EXAM: CT HEAD WITHOUT CONTRAST CT MAXILLOFACIAL WITHOUT CONTRAST CT CERVICAL SPINE WITHOUT CONTRAST TECHNIQUE: Multidetector CT imaging of the head, cervical spine, and maxillofacial structures were performed using the standard protocol without intravenous contrast. Multiplanar CT image reconstructions of the cervical spine and maxillofacial structures were also generated. RADIATION DOSE REDUCTION: This exam was performed according to the departmental dose-optimization program which includes automated exposure control, adjustment of the mA and/or kV according to patient size and/or use of iterative reconstruction technique. COMPARISON:  Head CT 04/27/2022. Facial CT and cervical spine CT 02/12/2022 FINDINGS: CT HEAD FINDINGS Brain: Stable age advanced cerebral atrophy, ventriculomegaly and periventricular white matter disease. No extra-axial fluid collections are identified. No CT findings for  acute hemispheric infarction or intracranial hemorrhage. No mass lesions. The brainstem and cerebellum are normal. Vascular: Stable vascular calcifications. No aneurysm hyperdense vessels. Skull: No acute skull fracture or bone lesions. Left-sided facial bone fractures noted. Other: No scalp lesions or scalp hematoma. CT MAXILLOFACIAL FINDINGS Osseous: Comminuted and displaced fractures involving the anterior and posterolateral walls of left maxillary sinus. The sinus is filled with hematoma. Comminuted and displaced fracture of the left zygoma. The lateral orbital wall is fractured along with the orbital floor. No evidence of entrapment of the inferior rectus muscle. The orbital roof is intact and the medial orbital wall is intact. No acute nasal bone fracture. No right-sided facial bone fractures. The mandibular condyles are normally located. No mandible fracture. Orbits: Left lateral orbital wall fracture and left  orbital floor fracture. The left globe is intact. No intraorbital hematoma. Sinuses: The left maxillary sinus is filled with hematoma. The other paranasal sinuses and mastoid air cells are clear. Soft tissues: No significant findings. CT CERVICAL SPINE FINDINGS Alignment: Normal Skull base and vertebrae: No acute fracture. No primary bone lesion or focal pathologic process. Stable sclerotic lesion in the C4 vertebral body. Remote T3 fracture. Soft tissues and spinal canal: No prevertebral fluid or swelling. No visible canal hematoma. Disc levels: Degenerative cervical spondylosis with multilevel disc disease and facet disease but no large disc protrusions or significant canal compromise. There is multilevel foraminal stenosis due to uncinate spurring and facet disease. Upper chest: The lung apices are grossly clear. Other: Severe age advanced bilateral carotid artery calcifications. IMPRESSION: 1. Stable age advanced cerebral atrophy, ventriculomegaly and periventricular white matter disease. 2. No acute intracranial findings or skull fracture. 3. Comminuted and displaced fractures involving the anterior and posterolateral walls of the left maxillary sinus. 4. Comminuted and displaced fracture of the left zygoma. 5. Left lateral orbital wall fracture and left orbital floor fracture. No evidence of entrapment of the inferior rectus muscle. 6. No acute cervical spine fracture. 7. Degenerative cervical spondylosis with multilevel disc disease and facet disease but no large disc protrusions or significant canal compromise. 8. Severe age advanced bilateral carotid artery calcifications. Electronically Signed   By: Marijo Sanes M.D.   On: 05/23/2022 09:08   CT Cervical Spine Wo Contrast  Result Date: 05/23/2022 CLINICAL DATA:  [place] 6 days ago. EXAM: CT HEAD WITHOUT CONTRAST CT MAXILLOFACIAL WITHOUT CONTRAST CT CERVICAL SPINE WITHOUT CONTRAST TECHNIQUE: Multidetector CT imaging of the head, cervical spine, and  maxillofacial structures were performed using the standard protocol without intravenous contrast. Multiplanar CT image reconstructions of the cervical spine and maxillofacial structures were also generated. RADIATION DOSE REDUCTION: This exam was performed according to the departmental dose-optimization program which includes automated exposure control, adjustment of the mA and/or kV according to patient size and/or use of iterative reconstruction technique. COMPARISON:  Head CT 04/27/2022. Facial CT and cervical spine CT 02/12/2022 FINDINGS: CT HEAD FINDINGS Brain: Stable age advanced cerebral atrophy, ventriculomegaly and periventricular white matter disease. No extra-axial fluid collections are identified. No CT findings for acute hemispheric infarction or intracranial hemorrhage. No mass lesions. The brainstem and cerebellum are normal. Vascular: Stable vascular calcifications. No aneurysm hyperdense vessels. Skull: No acute skull fracture or bone lesions. Left-sided facial bone fractures noted. Other: No scalp lesions or scalp hematoma. CT MAXILLOFACIAL FINDINGS Osseous: Comminuted and displaced fractures involving the anterior and posterolateral walls of left maxillary sinus. The sinus is filled with hematoma. Comminuted and displaced fracture of the left zygoma. The lateral orbital wall is fractured  along with the orbital floor. No evidence of entrapment of the inferior rectus muscle. The orbital roof is intact and the medial orbital wall is intact. No acute nasal bone fracture. No right-sided facial bone fractures. The mandibular condyles are normally located. No mandible fracture. Orbits: Left lateral orbital wall fracture and left orbital floor fracture. The left globe is intact. No intraorbital hematoma. Sinuses: The left maxillary sinus is filled with hematoma. The other paranasal sinuses and mastoid air cells are clear. Soft tissues: No significant findings. CT CERVICAL SPINE FINDINGS Alignment: Normal  Skull base and vertebrae: No acute fracture. No primary bone lesion or focal pathologic process. Stable sclerotic lesion in the C4 vertebral body. Remote T3 fracture. Soft tissues and spinal canal: No prevertebral fluid or swelling. No visible canal hematoma. Disc levels: Degenerative cervical spondylosis with multilevel disc disease and facet disease but no large disc protrusions or significant canal compromise. There is multilevel foraminal stenosis due to uncinate spurring and facet disease. Upper chest: The lung apices are grossly clear. Other: Severe age advanced bilateral carotid artery calcifications. IMPRESSION: 1. Stable age advanced cerebral atrophy, ventriculomegaly and periventricular white matter disease. 2. No acute intracranial findings or skull fracture. 3. Comminuted and displaced fractures involving the anterior and posterolateral walls of the left maxillary sinus. 4. Comminuted and displaced fracture of the left zygoma. 5. Left lateral orbital wall fracture and left orbital floor fracture. No evidence of entrapment of the inferior rectus muscle. 6. No acute cervical spine fracture. 7. Degenerative cervical spondylosis with multilevel disc disease and facet disease but no large disc protrusions or significant canal compromise. 8. Severe age advanced bilateral carotid artery calcifications. Electronically Signed   By: Marijo Sanes M.D.   On: 05/23/2022 09:08   CT Maxillofacial WO CM  Result Date: 05/23/2022 CLINICAL DATA:  [place] 6 days ago. EXAM: CT HEAD WITHOUT CONTRAST CT MAXILLOFACIAL WITHOUT CONTRAST CT CERVICAL SPINE WITHOUT CONTRAST TECHNIQUE: Multidetector CT imaging of the head, cervical spine, and maxillofacial structures were performed using the standard protocol without intravenous contrast. Multiplanar CT image reconstructions of the cervical spine and maxillofacial structures were also generated. RADIATION DOSE REDUCTION: This exam was performed according to the departmental  dose-optimization program which includes automated exposure control, adjustment of the mA and/or kV according to patient size and/or use of iterative reconstruction technique. COMPARISON:  Head CT 04/27/2022. Facial CT and cervical spine CT 02/12/2022 FINDINGS: CT HEAD FINDINGS Brain: Stable age advanced cerebral atrophy, ventriculomegaly and periventricular white matter disease. No extra-axial fluid collections are identified. No CT findings for acute hemispheric infarction or intracranial hemorrhage. No mass lesions. The brainstem and cerebellum are normal. Vascular: Stable vascular calcifications. No aneurysm hyperdense vessels. Skull: No acute skull fracture or bone lesions. Left-sided facial bone fractures noted. Other: No scalp lesions or scalp hematoma. CT MAXILLOFACIAL FINDINGS Osseous: Comminuted and displaced fractures involving the anterior and posterolateral walls of left maxillary sinus. The sinus is filled with hematoma. Comminuted and displaced fracture of the left zygoma. The lateral orbital wall is fractured along with the orbital floor. No evidence of entrapment of the inferior rectus muscle. The orbital roof is intact and the medial orbital wall is intact. No acute nasal bone fracture. No right-sided facial bone fractures. The mandibular condyles are normally located. No mandible fracture. Orbits: Left lateral orbital wall fracture and left orbital floor fracture. The left globe is intact. No intraorbital hematoma. Sinuses: The left maxillary sinus is filled with hematoma. The other paranasal sinuses and mastoid air cells are  clear. Soft tissues: No significant findings. CT CERVICAL SPINE FINDINGS Alignment: Normal Skull base and vertebrae: No acute fracture. No primary bone lesion or focal pathologic process. Stable sclerotic lesion in the C4 vertebral body. Remote T3 fracture. Soft tissues and spinal canal: No prevertebral fluid or swelling. No visible canal hematoma. Disc levels: Degenerative  cervical spondylosis with multilevel disc disease and facet disease but no large disc protrusions or significant canal compromise. There is multilevel foraminal stenosis due to uncinate spurring and facet disease. Upper chest: The lung apices are grossly clear. Other: Severe age advanced bilateral carotid artery calcifications. IMPRESSION: 1. Stable age advanced cerebral atrophy, ventriculomegaly and periventricular white matter disease. 2. No acute intracranial findings or skull fracture. 3. Comminuted and displaced fractures involving the anterior and posterolateral walls of the left maxillary sinus. 4. Comminuted and displaced fracture of the left zygoma. 5. Left lateral orbital wall fracture and left orbital floor fracture. No evidence of entrapment of the inferior rectus muscle. 6. No acute cervical spine fracture. 7. Degenerative cervical spondylosis with multilevel disc disease and facet disease but no large disc protrusions or significant canal compromise. 8. Severe age advanced bilateral carotid artery calcifications. Electronically Signed   By: Marijo Sanes M.D.   On: 05/23/2022 09:08            LOS: 7 days      Emeterio Reeve, DO [facility] 05/30/2022, 2:21 PM   Staff may message me via secure chat in [place]  but this may not receive immediate response,  please page for urgent matters!  If 7PM-7AM, please contact night-coverage [email or web address]  Dictation software was used to generate the above note. Typos may occur and escape review, as with typed/written notes. Please contact Dr Sheppard Coil directly for clarity if needed.

## 2022-05-31 MED ORDER — CHLORHEXIDINE GLUCONATE 4 % EX LIQD
Freq: Once | CUTANEOUS | Status: DC
  Filled 2022-05-31: qty 15

## 2022-05-31 MED ORDER — LABETALOL HCL 5 MG/ML IV SOLN: 5.0000 mg | INTRAVENOUS | Status: DC | PRN

## 2022-05-31 MED ORDER — CHLORHEXIDINE GLUCONATE CLOTH 2 % EX PADS
6.0000 | MEDICATED_PAD | Freq: Every day | CUTANEOUS | Status: DC
  Administered 2022-05-31 – 2022-06-03 (×3): 6 via TOPICAL

## 2022-05-31 MED ORDER — KETOROLAC TROMETHAMINE 30 MG/ML IJ SOLN
30.0000 mg | Freq: Once | INTRAMUSCULAR | Status: AC
  Administered 2022-05-31: 30 mg via INTRAVENOUS
  Filled 2022-05-31: qty 1

## 2022-05-31 NOTE — Progress Notes (Signed)
Physical Therapy Treatment Patient Details Name: Stephanie Skinner MRN: [id number] DOB: [birth year] Today's Date: 05/31/2022   History of Present Illness Pt is a 66 y.o. female presenting to hospital 9/28 after multiple falls over past week including 3 falls within past 24 hours; c/o facial pain to L aspect of her face, R shoulder and LE pain; also generalized weakness.  Imaging showing comminuted and displaced fractures involving the anterior and posterolateral walls of the left maxillary sinus; comminuted and displaced fracture of the left zygoma; Left lateral orbital wall fracture and left orbital floor fracture.  Pt admitted with acute blood loss anemia (Hgb 5.7), alcohol abuse, benzodiazepine abuse, and frequent falls.  PMH includes benzodiazepine dependence, alcohol dependence, htn, recent hospital discharge 9/8 (acute metabolic encephalopathy secondary to alcohol and benzodiazepine withdrawal complicated by delirium and COVID-19 infection), Hepatitis C, seizures, and elbow surgery.    PT Comments    Pt received in bed, agreeable to PT session. Pt initially dizzy with supine to sit which resolved after 2 minutes, VSS. Pt completed transfers with close supervision/CGA for safety due to unsteadiness and high fall risk. Pt completed gait training with increased time and support of RW 179ft. One occasion of LOB to Right, corrected with MinA. Pt positioned to comfort in recliner with chair alarm on and call bell in reach. Good tolerance for PT. Pt continues to be a good candidate for short term rehab at [facility].    Recommendations for follow up therapy are one component of a multi-disciplinary discharge planning process, led by the attending physician.  Recommendations may be updated based on patient status, additional functional criteria and insurance authorization.  Follow Up Recommendations  Skilled nursing-short term rehab (<3 hours/day) Can patient physically be transported by private vehicle: Yes    Assistance Recommended at Discharge Frequent or constant Supervision/Assistance  Patient can return home with the following A little help with walking and/or transfers;A little help with bathing/dressing/bathroom;Assistance with cooking/housework;Direct supervision/assist for medications management;Direct supervision/assist for financial management;Assist for transportation;Help with stairs or ramp for entrance   Equipment Recommendations  BSC/3in1;Rolling walker (2 wheels)    Recommendations for Other Services       Precautions / Restrictions Precautions Precautions: Fall Restrictions Weight Bearing Restrictions: No     Mobility  Bed Mobility Overal bed mobility: Needs Assistance Bed Mobility: Supine to Sit     Supine to sit: Supervision, HOB elevated Sit to supine: Supervision   General bed mobility comments: in recliner before and after    Transfers Overall transfer level: Needs assistance Equipment used: Rolling walker (2 wheels) Transfers: Sit to/from Stand Sit to Stand: Min guard, Supervision           General transfer comment: Supervision due to high fall risk    Ambulation/Gait Ambulation/Gait assistance: Min guard, Min assist Gait Distance (Feet): 185 Feet Assistive device: Rolling walker (2 wheels) Gait Pattern/deviations: Step-through pattern, Drifts right/left, Wide base of support Gait velocity: decreased     General Gait Details: Pt with slight LOB to R while ambulating, corrected with MinA   Stairs             Wheelchair Mobility    Modified Rankin (Stroke Patients Only)       Balance Overall balance assessment: Needs assistance Sitting-balance support: No upper extremity supported, Feet supported Sitting balance-Leahy Scale: Good Sitting balance - Comments: steady static sitting   Standing balance support: During functional activity, Bilateral upper extremity supported, Reliant on assistive device for balance Standing  balance-Leahy Scale:  Fair Standing balance comment: Balance appears to fluctuate depending on pt's awareness and focus. Currently benefiting from use of RW to reduce fall risk                            Cognition Arousal/Alertness: Awake/alert Behavior During Therapy: WFL for tasks assessed/performed, Agitated Overall Cognitive Status: No family/caregiver present to determine baseline cognitive functioning Area of Impairment: Orientation                 Orientation Level: Person, Place, Situation Current Attention Level: Focused Memory: Decreased recall of precautions Following Commands: Follows one step commands inconsistently Safety/Judgement: Decreased awareness of safety, Decreased awareness of deficits Awareness: Intellectual Problem Solving: Requires verbal cues, Requires tactile cues, Slow processing, Difficulty sequencing, Decreased initiation General Comments: overall generally cooperative.  wanting to go home        Exercises      General Comments        Pertinent Vitals/Pain Pain Assessment Pain Assessment: No/denies pain    Home Living                          Prior Function            PT Goals (current goals can now be found in the care plan section) Acute Rehab PT Goals Patient Stated Goal: to improve mobility and balance Progress towards PT goals: Progressing toward goals    Frequency    Min 2X/week      PT Plan Current plan remains appropriate    Co-evaluation              AM-PAC PT "6 Clicks" Mobility   Outcome Measure  Help needed turning from your back to your side while in a flat bed without using bedrails?: None Help needed moving from lying on your back to sitting on the side of a flat bed without using bedrails?: None Help needed moving to and from a bed to a chair (including a wheelchair)?: A Little Help needed standing up from a chair using your arms (e.g., wheelchair or bedside chair)?: A  Little Help needed to walk in hospital room?: A Little Help needed climbing 3-5 steps with a railing? : A Lot 6 Click Score: 19    End of Session Equipment Utilized During Treatment: Gait belt Activity Tolerance: Patient tolerated treatment well Patient left: in chair;with call bell/phone within reach;with chair alarm set;with nursing/sitter in room Nurse Communication: Mobility status;Other (comment) (Chair alarm on, initial dizziness, VSS) PT Visit Diagnosis: Unsteadiness on feet (R26.81);Muscle weakness (generalized) (M62.81);History of falling (Z91.81);Other abnormalities of gait and mobility (R26.89)     Time: 7829-5621 PT Time Calculation (min) (ACUTE ONLY): 28 min  Charges:  $Gait Training: 8-22 mins $Therapeutic Activity: 8-22 mins                    Zadie Cleverly, PTA    Jannet Askew 05/31/2022, 12:07 PM

## 2022-05-31 NOTE — Progress Notes (Signed)
PASRR received: [id number] [place], [place]

## 2022-05-31 NOTE — Progress Notes (Signed)
   PATIENT: Stephanie Skinner             VZD:[id number]  DOB: 1955-10-22   Cross Cover  Nursing reports discovery of scalp laceration right parietal area Patient is alert and oriented at this time states laceration occurred with one of her falls at home Measured area by nurse 2.2 cm x 0.6 cm x 0.2 cm  CT Head on 10/2 FINDINGS: Brain: No evidence of acute infarction, hemorrhage, extra-axial collection or mass lesion/mass effect. Unchanged size and shape of the ventricular system which is slightly prominent, likely due to the degree of volume loss.  Vascular: No hyperdense vessel or unexpected calcification.  Skull: Normal. Redemonstrated comminuted fractures involving theleft maxillary sinus, left zygoma, as well as the orbital floor on the left. There is mild soft tissue stranding along the right parietal scalp.  Sinuses/Orbits: Complete opacification of the left maxillary sinus with complex fractures involving the anterior, and lateral walls ofthe left maxillary sinus. There is also a fracture of the lateral orbital wall in the left with a small hematoma just lateral to the left lateral rectus muscle body   Other: None. Assessment  Area examined. Non painful No edema or bogginess. No drainage, No redness, Has evidence of healing. Patient in agreement to have matted hair area removed with trimming. Ordered chlorhexidine wash and steri strips   Kathlene Cote NP [facility]

## 2022-05-31 NOTE — Progress Notes (Signed)
PROGRESS NOTE    Stephanie Skinner   WNU:[id number] DOB: 30-Mar-1956  DOA: 05/23/2022 Date of Service: 05/31/22 PCP: Charlies Constable, MD     Brief Narrative / Hospital Course:  Stephanie Skinner is a 66 y.o. female with medical history significant for benzodiazepine dependence, alcohol dependence, hypertension, recent hospital discharge on 05/03/22 following admission for acute metabolic encephalopathy secondary to alcohol and benzodiazepine withdrawal complicated by delirium and COVID-19 infection. Patient was discharged home in stable condition and according to her roommate she started taking the 'pills" again.  He notes that she has been increasingly lethargic and has had frequent falls.  He called EMS on the day of admission after she fell landing on her face. 09/28: To ED/admitted to hospitalist service.  Significantly anemic, Hgb 5.7.  Heme positive stool per ER physician.  Received 2 units PRBC. 9/29 s/p EGD, no acute findings. No further w/u per GI. 9/30 scoring on ciwa protocal.  10/1 scoring 21 on CIWA protocol.  This happened this AM.  In mittens. Now on exam awakens and responds appropriately, but falls back asleep. Told her breakfast is here and she wants to eat. 10/2 patient was awake and alert this a.m.  Answered questions. "This is probably the most awake and I am seeing her this week.  After I had seen her she was scoring 14 on the CIWA protocol." Later patient was found on the floor confused and combative.  Patient apparently denied hitting her head but were not sure.  CT of the head ordered.  Daughter updated. Psychiatry consulted per daughters request -not meeting criteria for psychiatric inpatient admission, ideally would plan to taper off of Xanax entirely, suggest discontinuation per her outpatient physician.  10/3 still scoring on CIWA protocol.  10/4: on CIWA, tremor and anxiety seem to be improving. 10/5-10/6: remains stable, working on SNF placement     Consultants:   GI Psychiatry   Procedures: EGD 05/24/2022      ASSESSMENT & PLAN:   Principal Problem:   Benzodiazepine abuse (HCC) Active Problems:   ABLA (acute blood loss anemia)   Alcohol abuse   Essential hypertension   Frequent falls   Malnutrition of moderate degree  ABLA (acute blood loss anemia) Patient presents to the ER for evaluation of a fall and noted to have a hemoglobin of 5.7, at her baseline of 12.4 She has had occasional black stools and admits to NSAID use. Transfused 2 units of packed RBC 9/29 s/p EGD - unremarkable H/H has been stable, 05/31/22 is 9.6   Alcohol abuse Xanax abuse On folate and thiamine Vitamin B12 elevated 10/3 still scoring on ciwa protocol, better 10/4 into 10/5 and 10/6 Psych consulted input was appreciated - There is no indication currently to add any psychiatric medicine to what the patient is taking.  Also no indication for transfer at this time to the inpatient geriatric psychiatry unit. PLEASE SEE FULL CONSULT REPORT   Essential hypertension Continue holding BP meds    Frequent falls Facial fractures: Left maxillary sinus, left zygoma, left lateral orbital wall/orbital floor Pain control Fall precautions 10/2 was found on the floor. 10/3-10/6 sitter at bedside.  PT/OT rec. SNF    Benzodiazepine abuse/dependence (HCC) Patient has a history of benzodiazepine dependence/abuse and was recently hospitalized for benzodiazepine withdrawal with delirium. She was seen by psychiatry during that hospitalization and was discharged on Klonopin and citalopram but according to her daughter she was started back on Xanax by her outpatient psychiatrist. Patient is at risk  of going through withdrawal again. Per daughter she took "27 xanax in just few days". 10/3 psych started pt on xanax 0.5 mg tid. Advise slow taper downward outpatient    Headache Mild, requesting fiorinal Ordered toradol     DVT prophylaxis: Holding pharmacologic prophylaxis  in light of recent anemia Pertinent IV fluids/nutrition: Discontinued IV fluids, encourage p.o. intake Central lines / invasive devices: None  Code Status: Full code Family Communication: none at this time - TOC has been in contact w/ them re: placement   Disposition: Remains inpatient, anticipate discharge to SNF [facility] needs: SNF placement Barriers to discharge / significant pending items: Patient is stable medically, will pursue SNF placement              Subjective:  Patient reports no concerns other than headache and wants sunglasses       Objective:  Vitals:   05/31/22 0004 05/31/22 0325 05/31/22 0800 05/31/22 1200  BP: 122/68 139/68 (!) 151/69 138/71  Pulse: 93 91 91 92  Resp: 18 20 20 18   Temp: 98.6 F (37 C) 98.5 F (36.9 C) 97.6 F (36.4 C) 98 F (36.7 C)  TempSrc:    Oral  SpO2: 95% 94% 95% 100%  Weight:      Height:        Intake/Output Summary (Last 24 hours) at 05/31/2022 1420 Last data filed at 05/31/2022 1000 Gross per 24 hour  Intake 303 ml  Output --  Net 303 ml   Filed Weights   05/23/22 0811 05/26/22 2145  Weight: 61.2 kg 65.5 kg    Examination:  Constitutional:  VS as above General Appearance: alert, frail, NAD Respiratory: Normal respiratory effort No wheeze No rhonchi No rales Cardiovascular: S1/S2 normal No murmur No rub/gallop auscultated No lower extremity edema Gastrointestinal: No tenderness Musculoskeletal:  No clubbing/cyanosis of digits Symmetrical movement in all extremities Neurological: No cranial nerve deficit on limited exam Alert Psychiatric: Poor judgment/insight Calm mood and affect       Scheduled Medications:   ALPRAZolam  0.5 mg Oral Q8H   feeding supplement  237 mL Oral TID BM   folic acid  1 mg Oral Daily   multivitamin with minerals  1 tablet Oral Daily   pantoprazole  40 mg Oral BID   sodium chloride flush  10-40 mL Intracatheter Q12H   sodium chloride flush  10-40 mL  Intracatheter Q12H   thiamine  100 mg Oral Daily   Or   thiamine  100 mg Intravenous Daily    Continuous Infusions:   PRN Medications:  acetaminophen, labetalol, LORazepam, ondansetron **OR** ondansetron (ZOFRAN) IV, mouth rinse, risperiDONE, sodium chloride flush, sodium chloride flush  Antimicrobials:  Anti-infectives (From admission, onward)    None       Data Reviewed: I have personally reviewed following labs and imaging studies  CBC: Recent Labs  Lab 05/25/22 0534 05/26/22 0500 05/30/22 0514  WBC 7.9  --  8.4  HGB 8.7* 8.7* 9.6*  HCT 27.1* 27.6* 30.1*  MCV 94.1  --  92.6  PLT 236  --  316   Basic Metabolic Panel: Recent Labs  Lab 05/30/22 0514  NA 134*  K 4.3  CL 102  CO2 26  GLUCOSE 151*  BUN 8  CREATININE 0.39*  CALCIUM 8.5*   GFR: Estimated Creatinine Clearance: 63.9 mL/min (A) (by C-G formula based on SCr of 0.39 mg/dL (L)). Liver Function Tests: No results for input(s): "AST", "ALT", "ALKPHOS", "BILITOT", "PROT", "ALBUMIN" in the last 168 hours.  No results for input(s): "LIPASE", "AMYLASE" in the last 168 hours. No results for input(s): "AMMONIA" in the last 168 hours. Coagulation Profile: No results for input(s): "INR", "PROTIME" in the last 168 hours. Cardiac Enzymes: No results for input(s): "CKTOTAL", "CKMB", "CKMBINDEX", "TROPONINI" in the last 168 hours. BNP (last 3 results) No results for input(s): "PROBNP" in the last 8760 hours. HbA1C: No results for input(s): "HGBA1C" in the last 72 hours. CBG: No results for input(s): "GLUCAP" in the last 168 hours. Lipid Profile: No results for input(s): "CHOL", "HDL", "LDLCALC", "TRIG", "CHOLHDL", "LDLDIRECT" in the last 72 hours. Thyroid Function Tests: No results for input(s): "TSH", "T4TOTAL", "FREET4", "T3FREE", "THYROIDAB" in the last 72 hours. Anemia Panel: No results for input(s): "VITAMINB12", "FOLATE", "FERRITIN", "TIBC", "IRON", "RETICCTPCT" in the last 72 hours. Urine  analysis:    Component Value Date/Time   COLORURINE AMBER (A) 05/23/2022 1400   APPEARANCEUR HAZY (A) 05/23/2022 1400   LABSPEC 1.016 05/23/2022 1400   PHURINE 6.0 05/23/2022 1400   GLUCOSEU NEGATIVE 05/23/2022 1400   HGBUR NEGATIVE 05/23/2022 1400   BILIRUBINUR NEGATIVE 05/23/2022 1400   KETONESUR 20 (A) 05/23/2022 1400   PROTEINUR NEGATIVE 05/23/2022 1400   NITRITE NEGATIVE 05/23/2022 1400   LEUKOCYTESUR NEGATIVE 05/23/2022 1400   Sepsis Labs: @LABRCNTIP (procalcitonin:4,lacticidven:4)  No results found for this or any previous visit (from the past 240 hour(s)).       Radiology Studies: US Abdomen Limited RUQ (LIVER/GB)  Result Date: 05/23/2022 CLINICAL DATA:  A 66 year old female presents with elevated liver enzymes. EXAM: ULTRASOUND ABDOMEN LIMITED RIGHT UPPER QUADRANT COMPARISON:  None Available. FINDINGS: Gallbladder: No gallstones or wall thickening visualized. No sonographic Murphy sign noted by sonographer. Common bile duct: Diameter: 5.5 mm Liver: No focal lesion identified. Mild parenchymal increased echogenicity. Portal vein is patent on color Doppler imaging with normal direction of blood flow towards the liver. Other: Limited study due to patient body habitus. Query RIGHT pleural effusion versus artifact. IMPRESSION: No acute biliary process. Limited evaluation of the liver with suggestion of mild hepatic steatosis. Query RIGHT-sided pleural effusion versus artifact. Electronically Signed   By: Donzetta KohutGeoffrey  Wile M.D.   On: 05/23/2022 19:07   DG Shoulder Right  Result Date: 05/23/2022 CLINICAL DATA:  Status post fall, severe pain EXAM: RIGHT SHOULDER - 2+ VIEW COMPARISON:  None Available. FINDINGS: Generalized osteopenia. No acute fracture or dislocation. No aggressive osseous lesion. Normal alignment. Soft tissue are unremarkable. No radiopaque foreign body or soft tissue emphysema. IMPRESSION: No acute osseous injury of the right shoulder. Electronically Signed   By: Elige KoHetal   Patel M.D.   On: 05/23/2022 09:11   CT Head Wo Contrast  Result Date: 05/23/2022 CLINICAL DATA:  Larey SeatFell 6 days ago. EXAM: CT HEAD WITHOUT CONTRAST CT MAXILLOFACIAL WITHOUT CONTRAST CT CERVICAL SPINE WITHOUT CONTRAST TECHNIQUE: Multidetector CT imaging of the head, cervical spine, and maxillofacial structures were performed using the standard protocol without intravenous contrast. Multiplanar CT image reconstructions of the cervical spine and maxillofacial structures were also generated. RADIATION DOSE REDUCTION: This exam was performed according to the departmental dose-optimization program which includes automated exposure control, adjustment of the mA and/or kV according to patient size and/or use of iterative reconstruction technique. COMPARISON:  Head CT 04/27/2022. Facial CT and cervical spine CT 02/12/2022 FINDINGS: CT HEAD FINDINGS Brain: Stable age advanced cerebral atrophy, ventriculomegaly and periventricular white matter disease. No extra-axial fluid collections are identified. No CT findings for acute hemispheric infarction or intracranial hemorrhage. No mass lesions. The brainstem and cerebellum are normal.  Vascular: Stable vascular calcifications. No aneurysm hyperdense vessels. Skull: No acute skull fracture or bone lesions. Left-sided facial bone fractures noted. Other: No scalp lesions or scalp hematoma. CT MAXILLOFACIAL FINDINGS Osseous: Comminuted and displaced fractures involving the anterior and posterolateral walls of left maxillary sinus. The sinus is filled with hematoma. Comminuted and displaced fracture of the left zygoma. The lateral orbital wall is fractured along with the orbital floor. No evidence of entrapment of the inferior rectus muscle. The orbital roof is intact and the medial orbital wall is intact. No acute nasal bone fracture. No right-sided facial bone fractures. The mandibular condyles are normally located. No mandible fracture. Orbits: Left lateral orbital wall fracture  and left orbital floor fracture. The left globe is intact. No intraorbital hematoma. Sinuses: The left maxillary sinus is filled with hematoma. The other paranasal sinuses and mastoid air cells are clear. Soft tissues: No significant findings. CT CERVICAL SPINE FINDINGS Alignment: Normal Skull base and vertebrae: No acute fracture. No primary bone lesion or focal pathologic process. Stable sclerotic lesion in the C4 vertebral body. Remote T3 fracture. Soft tissues and spinal canal: No prevertebral fluid or swelling. No visible canal hematoma. Disc levels: Degenerative cervical spondylosis with multilevel disc disease and facet disease but no large disc protrusions or significant canal compromise. There is multilevel foraminal stenosis due to uncinate spurring and facet disease. Upper chest: The lung apices are grossly clear. Other: Severe age advanced bilateral carotid artery calcifications. IMPRESSION: 1. Stable age advanced cerebral atrophy, ventriculomegaly and periventricular white matter disease. 2. No acute intracranial findings or skull fracture. 3. Comminuted and displaced fractures involving the anterior and posterolateral walls of the left maxillary sinus. 4. Comminuted and displaced fracture of the left zygoma. 5. Left lateral orbital wall fracture and left orbital floor fracture. No evidence of entrapment of the inferior rectus muscle. 6. No acute cervical spine fracture. 7. Degenerative cervical spondylosis with multilevel disc disease and facet disease but no large disc protrusions or significant canal compromise. 8. Severe age advanced bilateral carotid artery calcifications. Electronically Signed   By: Rudie Meyer M.D.   On: 05/23/2022 09:08   CT Cervical Spine Wo Contrast  Result Date: 05/23/2022 CLINICAL DATA:  Larey Seat 6 days ago. EXAM: CT HEAD WITHOUT CONTRAST CT MAXILLOFACIAL WITHOUT CONTRAST CT CERVICAL SPINE WITHOUT CONTRAST TECHNIQUE: Multidetector CT imaging of the head, cervical spine,  and maxillofacial structures were performed using the standard protocol without intravenous contrast. Multiplanar CT image reconstructions of the cervical spine and maxillofacial structures were also generated. RADIATION DOSE REDUCTION: This exam was performed according to the departmental dose-optimization program which includes automated exposure control, adjustment of the mA and/or kV according to patient size and/or use of iterative reconstruction technique. COMPARISON:  Head CT 04/27/2022. Facial CT and cervical spine CT 02/12/2022 FINDINGS: CT HEAD FINDINGS Brain: Stable age advanced cerebral atrophy, ventriculomegaly and periventricular white matter disease. No extra-axial fluid collections are identified. No CT findings for acute hemispheric infarction or intracranial hemorrhage. No mass lesions. The brainstem and cerebellum are normal. Vascular: Stable vascular calcifications. No aneurysm hyperdense vessels. Skull: No acute skull fracture or bone lesions. Left-sided facial bone fractures noted. Other: No scalp lesions or scalp hematoma. CT MAXILLOFACIAL FINDINGS Osseous: Comminuted and displaced fractures involving the anterior and posterolateral walls of left maxillary sinus. The sinus is filled with hematoma. Comminuted and displaced fracture of the left zygoma. The lateral orbital wall is fractured along with the orbital floor. No evidence of entrapment of the inferior rectus muscle. The  orbital roof is intact and the medial orbital wall is intact. No acute nasal bone fracture. No right-sided facial bone fractures. The mandibular condyles are normally located. No mandible fracture. Orbits: Left lateral orbital wall fracture and left orbital floor fracture. The left globe is intact. No intraorbital hematoma. Sinuses: The left maxillary sinus is filled with hematoma. The other paranasal sinuses and mastoid air cells are clear. Soft tissues: No significant findings. CT CERVICAL SPINE FINDINGS Alignment:  Normal Skull base and vertebrae: No acute fracture. No primary bone lesion or focal pathologic process. Stable sclerotic lesion in the C4 vertebral body. Remote T3 fracture. Soft tissues and spinal canal: No prevertebral fluid or swelling. No visible canal hematoma. Disc levels: Degenerative cervical spondylosis with multilevel disc disease and facet disease but no large disc protrusions or significant canal compromise. There is multilevel foraminal stenosis due to uncinate spurring and facet disease. Upper chest: The lung apices are grossly clear. Other: Severe age advanced bilateral carotid artery calcifications. IMPRESSION: 1. Stable age advanced cerebral atrophy, ventriculomegaly and periventricular white matter disease. 2. No acute intracranial findings or skull fracture. 3. Comminuted and displaced fractures involving the anterior and posterolateral walls of the left maxillary sinus. 4. Comminuted and displaced fracture of the left zygoma. 5. Left lateral orbital wall fracture and left orbital floor fracture. No evidence of entrapment of the inferior rectus muscle. 6. No acute cervical spine fracture. 7. Degenerative cervical spondylosis with multilevel disc disease and facet disease but no large disc protrusions or significant canal compromise. 8. Severe age advanced bilateral carotid artery calcifications. Electronically Signed   By: Marijo Sanes M.D.   On: 05/23/2022 09:08   CT Maxillofacial WO CM  Result Date: 05/23/2022 CLINICAL DATA:  [place] 6 days ago. EXAM: CT HEAD WITHOUT CONTRAST CT MAXILLOFACIAL WITHOUT CONTRAST CT CERVICAL SPINE WITHOUT CONTRAST TECHNIQUE: Multidetector CT imaging of the head, cervical spine, and maxillofacial structures were performed using the standard protocol without intravenous contrast. Multiplanar CT image reconstructions of the cervical spine and maxillofacial structures were also generated. RADIATION DOSE REDUCTION: This exam was performed according to the departmental  dose-optimization program which includes automated exposure control, adjustment of the mA and/or kV according to patient size and/or use of iterative reconstruction technique. COMPARISON:  Head CT 04/27/2022. Facial CT and cervical spine CT 02/12/2022 FINDINGS: CT HEAD FINDINGS Brain: Stable age advanced cerebral atrophy, ventriculomegaly and periventricular white matter disease. No extra-axial fluid collections are identified. No CT findings for acute hemispheric infarction or intracranial hemorrhage. No mass lesions. The brainstem and cerebellum are normal. Vascular: Stable vascular calcifications. No aneurysm hyperdense vessels. Skull: No acute skull fracture or bone lesions. Left-sided facial bone fractures noted. Other: No scalp lesions or scalp hematoma. CT MAXILLOFACIAL FINDINGS Osseous: Comminuted and displaced fractures involving the anterior and posterolateral walls of left maxillary sinus. The sinus is filled with hematoma. Comminuted and displaced fracture of the left zygoma. The lateral orbital wall is fractured along with the orbital floor. No evidence of entrapment of the inferior rectus muscle. The orbital roof is intact and the medial orbital wall is intact. No acute nasal bone fracture. No right-sided facial bone fractures. The mandibular condyles are normally located. No mandible fracture. Orbits: Left lateral orbital wall fracture and left orbital floor fracture. The left globe is intact. No intraorbital hematoma. Sinuses: The left maxillary sinus is filled with hematoma. The other paranasal sinuses and mastoid air cells are clear. Soft tissues: No significant findings. CT CERVICAL SPINE FINDINGS Alignment: Normal Skull base and  vertebrae: No acute fracture. No primary bone lesion or focal pathologic process. Stable sclerotic lesion in the C4 vertebral body. Remote T3 fracture. Soft tissues and spinal canal: No prevertebral fluid or swelling. No visible canal hematoma. Disc levels: Degenerative  cervical spondylosis with multilevel disc disease and facet disease but no large disc protrusions or significant canal compromise. There is multilevel foraminal stenosis due to uncinate spurring and facet disease. Upper chest: The lung apices are grossly clear. Other: Severe age advanced bilateral carotid artery calcifications. IMPRESSION: 1. Stable age advanced cerebral atrophy, ventriculomegaly and periventricular white matter disease. 2. No acute intracranial findings or skull fracture. 3. Comminuted and displaced fractures involving the anterior and posterolateral walls of the left maxillary sinus. 4. Comminuted and displaced fracture of the left zygoma. 5. Left lateral orbital wall fracture and left orbital floor fracture. No evidence of entrapment of the inferior rectus muscle. 6. No acute cervical spine fracture. 7. Degenerative cervical spondylosis with multilevel disc disease and facet disease but no large disc protrusions or significant canal compromise. 8. Severe age advanced bilateral carotid artery calcifications. Electronically Signed   By: Rudie Meyer M.D.   On: 05/23/2022 09:08            LOS: 8 days      Sunnie Nielsen, DO [facility] 05/31/2022, 2:20 PM   Staff may message me via secure chat in Epic  but this may not receive immediate response,  please page for urgent matters!  If 7PM-7AM, please contact night-coverage [email or web address]  Dictation software was used to generate the above note. Typos may occur and escape review, as with typed/written notes. Please contact Dr Lyn Hollingshead directly for clarity if needed.

## 2022-05-31 NOTE — TOC Progression Note (Addendum)
Transition of Care [facility]) - Progression Note    Patient Details  Name: JUHI LAGRANGE MRN: [id number] Date of Birth: [birth year]  Transition of Care [facility]) CM/SW [place], [place] Phone Number: 05/31/2022, 9:30 AM  Clinical Narrative:     [place] Must requesting physical address for PASRR number, as patient's address in chart is [address].   CSW spoke with patient's son in law Marland Kitchen who confirms address is [address] [place], [place] [address]. CSW has updated this information in  Must pending PASRR number.   Bed offers emailed to son per his request at [email or web address].   CSW has also emailed clinicals to potential alf after snf that is located in [place] per family request, clinicals sent to [employer] with admission with [facility] at [email or web address]  Expected Discharge Plan: [place] Barriers to Discharge: Continued Medical Work up  Expected Discharge Plan and Services Expected Discharge Plan: [place]                                               Social Determinants of Health (SDOH) Interventions    Readmission Risk Interventions     No data to display

## 2022-06-01 MED ORDER — PSEUDOEPHEDRINE HCL 30 MG PO TABS
30.0000 mg | ORAL_TABLET | Freq: Four times a day (QID) | ORAL | Status: DC | PRN
  Administered 2022-06-01 – 2022-06-03 (×3): 30 mg via ORAL
  Filled 2022-06-01 (×4): qty 1

## 2022-06-01 MED ORDER — PROPRANOLOL HCL ER 60 MG PO CP24
60.0000 mg | ORAL_CAPSULE | Freq: Every day | ORAL | Status: DC
  Administered 2022-06-01 – 2022-06-03 (×3): 60 mg via ORAL
  Filled 2022-06-01 (×3): qty 1

## 2022-06-01 MED ORDER — TRAZODONE HCL 50 MG PO TABS
50.0000 mg | ORAL_TABLET | Freq: Every evening | ORAL | Status: DC | PRN
  Administered 2022-06-01 – 2022-06-02 (×2): 50 mg via ORAL
  Filled 2022-06-01 (×2): qty 1

## 2022-06-01 NOTE — Progress Notes (Signed)
PROGRESS NOTE    Stephanie Skinner   DVV:[id number] DOB: May 27, 1956  DOA: 05/23/2022 Date of Service: 06/01/22 PCP: Charlies Constable, MD     Brief Narrative / Hospital Course:  Stephanie Skinner is a 66 y.o. female with medical history significant for benzodiazepine dependence, alcohol dependence, hypertension, recent hospital discharge on 05/03/22 following admission for acute metabolic encephalopathy secondary to alcohol and benzodiazepine withdrawal complicated by delirium and COVID-19 infection. Patient was discharged home in stable condition and according to her roommate she started taking the 'pills" again.  He notes that she has been increasingly lethargic and has had frequent falls.  He called EMS on the day of admission after she fell landing on her face. 09/28: To ED/admitted to hospitalist service.  Significantly anemic, Hgb 5.7.  Heme positive stool per ER physician.  Received 2 units PRBC. 9/29 s/p EGD, no acute findings. No further w/u per GI. 9/30 scoring on ciwa protocal.  10/1 scoring 21 on CIWA protocol.  This happened this AM.  In mittens. Now on exam awakens and responds appropriately, but falls back asleep. Told her breakfast is here and she wants to eat. 10/2 patient was awake and alert this a.m.  Answered questions. "This is probably the most awake and I am seeing her this week.  After I had seen her she was scoring 14 on the CIWA protocol." Later patient was found on the floor confused and combative.  Patient apparently denied hitting her head but were not sure.  CT of the head ordered.  Daughter updated. Psychiatry consulted per daughters request -not meeting criteria for psychiatric inpatient admission, ideally would plan to taper off of Xanax entirely, suggest discontinuation per her outpatient physician.  10/3 still scoring on CIWA protocol.  10/4: on CIWA, tremor and anxiety seem to be improving. 10/5-10/7: remains stable, working on SNF placement     Consultants:   GI Psychiatry   Procedures: EGD 05/24/2022      ASSESSMENT & PLAN:   Principal Problem:   Benzodiazepine abuse (HCC) Active Problems:   ABLA (acute blood loss anemia)   Alcohol abuse   Essential hypertension   Frequent falls   Malnutrition of moderate degree  ABLA (acute blood loss anemia) Patient presented to the ER for evaluation of a fall and noted to have a hemoglobin of 5.7, vs her baseline of 12.4 She has had occasional black stools and admits to NSAID use. Transfused 2 units of packed RBC 9/29 s/p EGD - unremarkable H/H has been stable, 05/30/22 is 9.6 Serial CBC   Alcohol abuse Xanax abuse On folate and thiamine Vitamin B12 elevated 10/3 still scoring on ciwa protocol, better 10/4 into 10/5 and 10/6 Psych consulted input was appreciated - There is no indication currently to add any psychiatric medicine to what the patient is taking.  Also no indication for transfer at this time to the inpatient geriatric psychiatry unit. PLEASE SEE FULL CONSULT REPORT   Benzodiazepine abuse/dependence (HCC) Patient has a history of benzodiazepine dependence/abuse and was recently hospitalized for benzodiazepine withdrawal with delirium. She was seen by psychiatry during that hospitalization and was discharged on Klonopin and citalopram but according to her daughter she was started back on Xanax by her outpatient psychiatrist. Patient is at risk of going through withdrawal again. Per daughter she took "69 xanax in just few days". 10/3 psych started pt on xanax 0.5 mg tid. Advise slow taper downward outpatient    Essential hypertension Held home BP meds amlodipine Started propranolol today  given BP creeping up - BB may help prevent headaches, help w/ anxiety    Frequent falls Facial fractures: Left maxillary sinus, left zygoma, left lateral orbital wall/orbital floor 10/2 was found on the floor. 10/3-10/6 sitter at bedside. Pain control Fall precautions PT/OT rec.  SNF  Headache Mild, requesting fiorinal Ordered toradol yesterday  Monitor     DVT prophylaxis: Holding pharmacologic prophylaxis in light of recent anemia Pertinent IV fluids/nutrition: Discontinued IV fluids, encourage p.o. intake Central lines / invasive devices: None  Code Status: Full code Family Communication: none at this time - TOC has been in contact w/ them re: placement   Disposition: Remains inpatient[facility] Barriers to discharge / significant pending items: Patient is stable medically, will pursue SNF placement              Subjective:  Patient requests something for sleep, upset that sitter last night was keeping her awake       Objective:  Vitals:   06/01/22 0115 06/01/22 0501 06/01/22 0859 06/01/22 1132  BP: (!) 145/72 (!) 153/72 (!) 144/64 (!) 155/72  Pulse: 87 85 87 83  Resp: 19 18 17 17   Temp: 98 F (36.7 C) 97.7 F (36.5 C) 98.2 F (36.8 C) 97.6 F (36.4 C)  TempSrc:      SpO2: 97% 98% 96% 96%  Weight:      Height:        Intake/Output Summary (Last 24 hours) at 06/01/2022 1352 Last data filed at 06/01/2022 0400 Gross per 24 hour  Intake 600 ml  Output --  Net 600 ml   Filed Weights   05/23/22 0811 05/26/22 2145  Weight: 61.2 kg 65.5 kg    Examination:  Constitutional:  VS as above General Appearance: alert, frail, NAD Respiratory: Normal respiratory effort No wheeze No rhonchi No rales Cardiovascular: S1/S2 normal No murmur No rub/gallop auscultated No lower extremity edema Gastrointestinal: No tenderness Musculoskeletal:  No clubbing/cyanosis of digits Symmetrical movement in all extremities Neurological: No cranial nerve deficit on limited exam Alert Psychiatric: Poor judgment/insight Calm mood and affect       Scheduled Medications:   ALPRAZolam  0.5 mg Oral Q8H   Chlorhexidine Gluconate Cloth  6 each Topical Q0600   feeding supplement  237 mL  Oral TID BM   folic acid  1 mg Oral Daily   multivitamin with minerals  1 tablet Oral Daily   pantoprazole  40 mg Oral BID   propranolol ER  60 mg Oral Daily   sodium chloride flush  10-40 mL Intracatheter Q12H   sodium chloride flush  10-40 mL Intracatheter Q12H   thiamine  100 mg Oral Daily   Or   thiamine  100 mg Intravenous Daily    Continuous Infusions:   PRN Medications:  acetaminophen, labetalol, LORazepam, ondansetron **OR** ondansetron (ZOFRAN) IV, mouth rinse, pseudoephedrine, risperiDONE, sodium chloride flush, sodium chloride flush, traZODone  Antimicrobials:  Anti-infectives (From admission, onward)    None       Data Reviewed: I have personally reviewed following labs and imaging studies  CBC: Recent Labs  Lab 05/26/22 0500 05/30/22 0514  WBC  --  8.4  HGB 8.7* 9.6*  HCT 27.6* 30.1*  MCV  --  92.6  PLT  --  316   Basic Metabolic Panel: Recent Labs  Lab 05/30/22 0514  NA 134*  K 4.3  CL 102  CO2 26  GLUCOSE 151*  BUN 8  CREATININE 0.39*  CALCIUM 8.5*   GFR: Estimated Creatinine Clearance: 63.9 mL/min (A) (by C-G formula based on SCr of 0.39 mg/dL (L)). Liver Function Tests: No results for input(s): "AST", "ALT", "ALKPHOS", "BILITOT", "PROT", "ALBUMIN" in the last 168 hours.  No results for input(s): "LIPASE", "AMYLASE" in the last 168 hours. No results for input(s): "AMMONIA" in the last 168 hours. Coagulation Profile: No results for input(s): "INR", "PROTIME" in the last 168 hours. Cardiac Enzymes: No results for input(s): "CKTOTAL", "CKMB", "CKMBINDEX", "TROPONINI" in the last 168 hours. BNP (last 3 results) No results for input(s): "PROBNP" in the last 8760 hours. HbA1C: No results for input(s): "HGBA1C" in the last 72 hours. CBG: No results for input(s): "GLUCAP" in the last 168 hours. Lipid Profile: No results for input(s): "CHOL", "HDL", "LDLCALC", "TRIG", "CHOLHDL", "LDLDIRECT" in the last 72 hours. Thyroid Function Tests: No  results for input(s): "TSH", "T4TOTAL", "FREET4", "T3FREE", "THYROIDAB" in the last 72 hours. Anemia Panel: No results for input(s): "VITAMINB12", "FOLATE", "FERRITIN", "TIBC", "IRON", "RETICCTPCT" in the last 72 hours. Urine analysis:    Component Value Date/Time   COLORURINE AMBER (A) 05/23/2022 1400   APPEARANCEUR HAZY (A) 05/23/2022 1400   LABSPEC 1.016 05/23/2022 1400   PHURINE 6.0 05/23/2022 1400   GLUCOSEU NEGATIVE 05/23/2022 1400   HGBUR NEGATIVE 05/23/2022 1400   BILIRUBINUR NEGATIVE 05/23/2022 1400   KETONESUR 20 (A) 05/23/2022 1400   PROTEINUR NEGATIVE 05/23/2022 1400   NITRITE NEGATIVE 05/23/2022 1400   LEUKOCYTESUR NEGATIVE 05/23/2022 1400   Sepsis Labs: (procalcitonin:4,lacticidven:4)  No results found for this or any previous visit (from the past 240 hour(s)).       Radiology Studies: US Abdomen Limited RUQ (LIVER/GB)  Result Date: 05/23/2022 CLINICAL DATA:  A 66 year old female presents with elevated liver enzymes. EXAM: ULTRASOUND ABDOMEN LIMITED RIGHT UPPER QUADRANT COMPARISON:  None Available. FINDINGS: Gallbladder: No gallstones or wall thickening visualized. No sonographic Murphy sign noted by sonographer. Common bile duct: Diameter: 5.5 mm Liver: No focal lesion identified. Mild parenchymal increased echogenicity. Portal vein is patent on color Doppler imaging with normal direction of blood flow towards the liver. Other: Limited study due to patient body habitus. Query RIGHT pleural effusion versus artifact. IMPRESSION: No acute biliary process. Limited evaluation of the liver with suggestion of mild hepatic steatosis. Query RIGHT-sided pleural effusion versus artifact. Electronically Signed   By: Donzetta Kohut M.D.   On: 05/23/2022 19:07   DG Shoulder Right  Result Date: 05/23/2022 CLINICAL DATA:  Status post fall, severe pain EXAM: RIGHT SHOULDER - 2+ VIEW COMPARISON:  None Available. FINDINGS: Generalized osteopenia. No acute fracture or  dislocation. No aggressive osseous lesion. Normal alignment. Soft tissue are unremarkable. No radiopaque foreign body or soft tissue emphysema. IMPRESSION: No acute osseous injury of the right shoulder. Electronically Signed   By: Elige Ko M.D.   On: 05/23/2022 09:11   CT Head Wo Contrast  Result Date: 05/23/2022 CLINICAL DATA:  Larey Seat 6 days ago. EXAM: CT HEAD WITHOUT CONTRAST CT MAXILLOFACIAL WITHOUT CONTRAST CT CERVICAL SPINE WITHOUT CONTRAST TECHNIQUE: Multidetector CT imaging of the head, cervical spine, and maxillofacial structures were performed using the standard protocol without intravenous contrast. Multiplanar CT image reconstructions of the cervical spine and maxillofacial structures were also generated. RADIATION DOSE REDUCTION: This exam was performed according to the departmental dose-optimization program which includes automated exposure control, adjustment of the mA and/or kV according to patient size and/or use of iterative reconstruction technique. COMPARISON:  Head CT 04/27/2022. Facial CT and cervical spine CT 02/12/2022 FINDINGS:  CT HEAD FINDINGS Brain: Stable age advanced cerebral atrophy, ventriculomegaly and periventricular white matter disease. No extra-axial fluid collections are identified. No CT findings for acute hemispheric infarction or intracranial hemorrhage. No mass lesions. The brainstem and cerebellum are normal. Vascular: Stable vascular calcifications. No aneurysm hyperdense vessels. Skull: No acute skull fracture or bone lesions. Left-sided facial bone fractures noted. Other: No scalp lesions or scalp hematoma. CT MAXILLOFACIAL FINDINGS Osseous: Comminuted and displaced fractures involving the anterior and posterolateral walls of left maxillary sinus. The sinus is filled with hematoma. Comminuted and displaced fracture of the left zygoma. The lateral orbital wall is fractured along with the orbital floor. No evidence of entrapment of the inferior rectus muscle. The  orbital roof is intact and the medial orbital wall is intact. No acute nasal bone fracture. No right-sided facial bone fractures. The mandibular condyles are normally located. No mandible fracture. Orbits: Left lateral orbital wall fracture and left orbital floor fracture. The left globe is intact. No intraorbital hematoma. Sinuses: The left maxillary sinus is filled with hematoma. The other paranasal sinuses and mastoid air cells are clear. Soft tissues: No significant findings. CT CERVICAL SPINE FINDINGS Alignment: Normal Skull base and vertebrae: No acute fracture. No primary bone lesion or focal pathologic process. Stable sclerotic lesion in the C4 vertebral body. Remote T3 fracture. Soft tissues and spinal canal: No prevertebral fluid or swelling. No visible canal hematoma. Disc levels: Degenerative cervical spondylosis with multilevel disc disease and facet disease but no large disc protrusions or significant canal compromise. There is multilevel foraminal stenosis due to uncinate spurring and facet disease. Upper chest: The lung apices are grossly clear. Other: Severe age advanced bilateral carotid artery calcifications. IMPRESSION: 1. Stable age advanced cerebral atrophy, ventriculomegaly and periventricular white matter disease. 2. No acute intracranial findings or skull fracture. 3. Comminuted and displaced fractures involving the anterior and posterolateral walls of the left maxillary sinus. 4. Comminuted and displaced fracture of the left zygoma. 5. Left lateral orbital wall fracture and left orbital floor fracture. No evidence of entrapment of the inferior rectus muscle. 6. No acute cervical spine fracture. 7. Degenerative cervical spondylosis with multilevel disc disease and facet disease but no large disc protrusions or significant canal compromise. 8. Severe age advanced bilateral carotid artery calcifications. Electronically Signed   By: Rudie Meyer M.D.   On: 05/23/2022 09:08   CT Cervical  Spine Wo Contrast  Result Date: 05/23/2022 CLINICAL DATA:  Larey Seat 6 days ago. EXAM: CT HEAD WITHOUT CONTRAST CT MAXILLOFACIAL WITHOUT CONTRAST CT CERVICAL SPINE WITHOUT CONTRAST TECHNIQUE: Multidetector CT imaging of the head, cervical spine, and maxillofacial structures were performed using the standard protocol without intravenous contrast. Multiplanar CT image reconstructions of the cervical spine and maxillofacial structures were also generated. RADIATION DOSE REDUCTION: This exam was performed according to the departmental dose-optimization program which includes automated exposure control, adjustment of the mA and/or kV according to patient size and/or use of iterative reconstruction technique. COMPARISON:  Head CT 04/27/2022. Facial CT and cervical spine CT 02/12/2022 FINDINGS: CT HEAD FINDINGS Brain: Stable age advanced cerebral atrophy, ventriculomegaly and periventricular white matter disease. No extra-axial fluid collections are identified. No CT findings for acute hemispheric infarction or intracranial hemorrhage. No mass lesions. The brainstem and cerebellum are normal. Vascular: Stable vascular calcifications. No aneurysm hyperdense vessels. Skull: No acute skull fracture or bone lesions. Left-sided facial bone fractures noted. Other: No scalp lesions or scalp hematoma. CT MAXILLOFACIAL FINDINGS Osseous: Comminuted and displaced fractures involving the anterior and posterolateral  walls of left maxillary sinus. The sinus is filled with hematoma. Comminuted and displaced fracture of the left zygoma. The lateral orbital wall is fractured along with the orbital floor. No evidence of entrapment of the inferior rectus muscle. The orbital roof is intact and the medial orbital wall is intact. No acute nasal bone fracture. No right-sided facial bone fractures. The mandibular condyles are normally located. No mandible fracture. Orbits: Left lateral orbital wall fracture and left orbital floor fracture. The left  globe is intact. No intraorbital hematoma. Sinuses: The left maxillary sinus is filled with hematoma. The other paranasal sinuses and mastoid air cells are clear. Soft tissues: No significant findings. CT CERVICAL SPINE FINDINGS Alignment: Normal Skull base and vertebrae: No acute fracture. No primary bone lesion or focal pathologic process. Stable sclerotic lesion in the C4 vertebral body. Remote T3 fracture. Soft tissues and spinal canal: No prevertebral fluid or swelling. No visible canal hematoma. Disc levels: Degenerative cervical spondylosis with multilevel disc disease and facet disease but no large disc protrusions or significant canal compromise. There is multilevel foraminal stenosis due to uncinate spurring and facet disease. Upper chest: The lung apices are grossly clear. Other: Severe age advanced bilateral carotid artery calcifications. IMPRESSION: 1. Stable age advanced cerebral atrophy, ventriculomegaly and periventricular white matter disease. 2. No acute intracranial findings or skull fracture. 3. Comminuted and displaced fractures involving the anterior and posterolateral walls of the left maxillary sinus. 4. Comminuted and displaced fracture of the left zygoma. 5. Left lateral orbital wall fracture and left orbital floor fracture. No evidence of entrapment of the inferior rectus muscle. 6. No acute cervical spine fracture. 7. Degenerative cervical spondylosis with multilevel disc disease and facet disease but no large disc protrusions or significant canal compromise. 8. Severe age advanced bilateral carotid artery calcifications. Electronically Signed   By: Marijo Sanes M.D.   On: 05/23/2022 09:08   CT Maxillofacial WO CM  Result Date: 05/23/2022 CLINICAL DATA:  [place] 6 days ago. EXAM: CT HEAD WITHOUT CONTRAST CT MAXILLOFACIAL WITHOUT CONTRAST CT CERVICAL SPINE WITHOUT CONTRAST TECHNIQUE: Multidetector CT imaging of the head, cervical spine, and maxillofacial structures were performed using  the standard protocol without intravenous contrast. Multiplanar CT image reconstructions of the cervical spine and maxillofacial structures were also generated. RADIATION DOSE REDUCTION: This exam was performed according to the departmental dose-optimization program which includes automated exposure control, adjustment of the mA and/or kV according to patient size and/or use of iterative reconstruction technique. COMPARISON:  Head CT 04/27/2022. Facial CT and cervical spine CT 02/12/2022 FINDINGS: CT HEAD FINDINGS Brain: Stable age advanced cerebral atrophy, ventriculomegaly and periventricular white matter disease. No extra-axial fluid collections are identified. No CT findings for acute hemispheric infarction or intracranial hemorrhage. No mass lesions. The brainstem and cerebellum are normal. Vascular: Stable vascular calcifications. No aneurysm hyperdense vessels. Skull: No acute skull fracture or bone lesions. Left-sided facial bone fractures noted. Other: No scalp lesions or scalp hematoma. CT MAXILLOFACIAL FINDINGS Osseous: Comminuted and displaced fractures involving the anterior and posterolateral walls of left maxillary sinus. The sinus is filled with hematoma. Comminuted and displaced fracture of the left zygoma. The lateral orbital wall is fractured along with the orbital floor. No evidence of entrapment of the inferior rectus muscle. The orbital roof is intact and the medial orbital wall is intact. No acute nasal bone fracture. No right-sided facial bone fractures. The mandibular condyles are normally located. No mandible fracture. Orbits: Left lateral orbital wall fracture and left orbital floor fracture. The  left globe is intact. No intraorbital hematoma. Sinuses: The left maxillary sinus is filled with hematoma. The other paranasal sinuses and mastoid air cells are clear. Soft tissues: No significant findings. CT CERVICAL SPINE FINDINGS Alignment: Normal Skull base and vertebrae: No acute fracture.  No primary bone lesion or focal pathologic process. Stable sclerotic lesion in the C4 vertebral body. Remote T3 fracture. Soft tissues and spinal canal: No prevertebral fluid or swelling. No visible canal hematoma. Disc levels: Degenerative cervical spondylosis with multilevel disc disease and facet disease but no large disc protrusions or significant canal compromise. There is multilevel foraminal stenosis due to uncinate spurring and facet disease. Upper chest: The lung apices are grossly clear. Other: Severe age advanced bilateral carotid artery calcifications. IMPRESSION: 1. Stable age advanced cerebral atrophy, ventriculomegaly and periventricular white matter disease. 2. No acute intracranial findings or skull fracture. 3. Comminuted and displaced fractures involving the anterior and posterolateral walls of the left maxillary sinus. 4. Comminuted and displaced fracture of the left zygoma. 5. Left lateral orbital wall fracture and left orbital floor fracture. No evidence of entrapment of the inferior rectus muscle. 6. No acute cervical spine fracture. 7. Degenerative cervical spondylosis with multilevel disc disease and facet disease but no large disc protrusions or significant canal compromise. 8. Severe age advanced bilateral carotid artery calcifications. Electronically Signed   By: Rudie Meyer M.D.   On: 05/23/2022 09:08            LOS: 9 days      Sunnie Nielsen, DO [facility] 06/01/2022, 1:52 PM   Staff may message me via secure chat in Epic  but this may not receive immediate response,  please page for urgent matters!  If 7PM-7AM, please contact night-coverage [email or web address]  Dictation software was used to generate the above note. Typos may occur and escape review, as with typed/written notes. Please contact Dr Lyn Hollingshead directly for clarity if needed.

## 2022-06-02 NOTE — TOC Progression Note (Addendum)
Transition of Care [facility]) - Progression Note    Patient Details  Name: Stephanie Skinner MRN: [id number] Date of Birth: [birth year]  [facility] CM/SW Contact  Zigmund Daniel Dorian Pod, RN Phone [phone] 06/02/2022, 12:47 PM  Clinical Narrative:    Attempted outreach to daughter Beatrix Shipper) who requested [facility] RN to speak with her Marland Kitchen (son-in-law) on pt's behalf at # [phone] which was incorrect. Spoke with pt with a request to speak with the Marland Kitchen (son-in-law) concerning her discharge disposition and pt inquired "why not speak with me".   Further conversation as pt was alert to person and place. Verified with bedside nurse that pt forget at times and did not sleep well last night so this morning assessment pt not totally alert with situation or time. Pt initially requested out-pt therapy new an provided address [address] [place], [place] [address] near [place]. RN again ask to contact Marland Kitchen to further discuss her options. Pt again requested that information as RN discussed SNF for ongoing therapy needs. Pt adamantly declined however receptive to [place] and wants to return home.  Offered once again to contact [facility] and pt states only to report that she does not wish to go to SNF but will consider HHPT, only.  TOC to follow up accordingly on pt's discharge disposition.     Expected Discharge Plan: Skilled Nursing Facility Barriers to Discharge: Continued Medical Work up  Expected Discharge Plan and Services Expected Discharge Plan: [place]                                               Social Determinants of Health (SDOH) Interventions    Readmission Risk Interventions     No data to display

## 2022-06-02 NOTE — TOC Progression Note (Signed)
Transition of Care [facility]) - Progression Note    Patient Details  Name: Stephanie Skinner MRN: [id number] Date of Birth: Nov 19, 1955  Transition of Care [facility]) CM/SW Contact  Zigmund Daniel Dorian Pod, RN Phone Number:[phone] 06/02/2022, 2:54 PM  Clinical Narrative:    Incoming call from [place] who was updated on the information discussed with pt concerning her refusal for SNF however agreeable to [place]. Strongly encouraged Marland Kitchen to establish DPR for future conversations that needs to be established if conversations are to be avoided with the pt with her ongoing memory status (verbalized an understanding). Marland Kitchen indicated this was supposed to be a "soft approach" to pt for placement however pt is now aware and refusing the rehabilitation. States he will discuss with pt and get with his wife [name] for [facility] for future decisions concerning pt's discharge needs.    TOC will continue to work with pt/family concerning the final discharge disposition for this pt.  Expected Discharge Plan: Skilled Nursing Facility Barriers to Discharge: Continued Medical Work up  Expected Discharge Plan and Services Expected Discharge Plan: [place]                                               Social Determinants of Health (SDOH) Interventions    Readmission Risk Interventions     No data to display

## 2022-06-02 NOTE — Progress Notes (Signed)
PROGRESS NOTE    Stephanie Skinner   Stephanie Skinner DOB: [birth year]  DOA: 05/23/2022 Date of Service: 06/02/22 PCP: Stephanie Constable, MD     Brief Narrative / Hospital Course:  Stephanie Skinner is a 66 y.o. female with medical history significant for benzodiazepine dependence, alcohol dependence, hypertension, recent hospital discharge on 05/03/22 following admission for acute metabolic encephalopathy secondary to alcohol and benzodiazepine withdrawal complicated by delirium and COVID-19 infection. Patient was discharged home in stable condition and according to her roommate she started taking the 'pills" again.  He notes that she has been increasingly lethargic and has had frequent falls.  He called EMS on the day of admission after she fell landing on her face. 09/28: To ED/admitted to hospitalist service.  Significantly anemic, Hgb 5.7.  Heme positive stool per ER physician.  Received 2 units PRBC. 9/29 s/p EGD, no acute findings. No further w/u per GI. 9/30 scoring on ciwa protocal.  10/1 scoring 21 on CIWA protocol.  This happened this AM.  In mittens. Now on exam awakens and responds appropriately, but falls back asleep. Told her breakfast is here and she wants to eat. 10/2 patient was awake and alert this a.m.  Answered questions. "This is probably the most awake and I am seeing her this week.  After I had seen her she was scoring 14 on the CIWA protocol." Later patient was found on the floor confused and combative.  Patient apparently denied hitting her head but were not sure.  CT of the head ordered.  Daughter updated. Psychiatry consulted per daughters request -not meeting criteria for psychiatric inpatient admission, ideally would plan to taper off of Xanax entirely, suggest discontinuation per her outpatient physician.  10/3 still scoring on CIWA protocol.  10/4: on CIWA, tremor and anxiety seem to be improving. 10/5-10/7: remains stable, working on SNF placement  10/8: pt a bit more alert  but still intermittently confused. [place] TOC notes - pt refusing SNF, family undecided / may consider HH. Awaiting this determination from family prior to dispo     Consultants:  GI Psychiatry   Procedures: EGD 05/24/2022      ASSESSMENT & PLAN:   Principal Problem:   Benzodiazepine abuse (HCC) Active Problems:   ABLA (acute blood loss anemia)   Alcohol abuse   Essential hypertension   Frequent falls   Malnutrition of moderate degree  ABLA (acute blood loss anemia) Patient presented to the ER for evaluation of a fall and noted to have a hemoglobin of 5.7, vs her baseline of 12.4 She has had occasional black stools and admits to NSAID use. Transfused 2 units of packed RBC 9/29 s/p EGD - unremarkable H/H has been stable, 05/30/22 is 9.6 Serial CBC   Alcohol abuse Xanax abuse On folate and thiamine Vitamin B12 elevated 10/3 still scoring on ciwa protocol, better 10/4 into 10/5 and 10/6 Psych consulted input was appreciated - There is no indication currently to add any psychiatric medicine to what the patient is taking.  Also no indication for transfer at this time to the inpatient geriatric psychiatry unit. PLEASE SEE FULL CONSULT REPORT   Benzodiazepine abuse/dependence (HCC) Patient has a history of benzodiazepine dependence/abuse and was recently hospitalized for benzodiazepine withdrawal with delirium. She was seen by psychiatry during that hospitalization and was discharged on Klonopin and citalopram but according to her daughter she was started back on Xanax by her outpatient psychiatrist. Patient is at risk of going through withdrawal again. Per daughter she took "84  xanax in just few days". 10/3 psych started pt on xanax 0.5 mg tid. Advise slow taper downward outpatient    Essential hypertension Held home BP meds amlodipine Started propranolol today given BP creeping up - BB may help prevent headaches, help w/ anxiety    Frequent falls Facial fractures: Left  maxillary sinus, left zygoma, left lateral orbital wall/orbital floor 10/2 was found on the floor. 10/3-10/6 sitter at bedside. Pain control Fall precautions PT/OT rec. SNF  Headache Mild, requesting fiorinal Ordered toradol yesterday  Monitor     DVT prophylaxis: Holding pharmacologic prophylaxis in light of recent anemia Pertinent IV fluids/nutrition: Discontinued IV fluids, encourage p.o. intake Central lines / invasive devices: None  Code Status: Full code Family Communication: none at this time - TOC has been in contact w/ them re: placement   Disposition: Remains inpatient, anticipate discharge to SNF [facility] needs: SNF placement Barriers to discharge / significant pending items: Patient is stable medically, will pursue SNF placement vs HH see TOC notes             Subjective:  Patient requests something for sleep, upset that sitter last night was keeping her awake       Objective:  Vitals:   06/01/22 2345 06/02/22 0523 06/02/22 0738 06/02/22 1128  BP: (!) 151/71 122/71 138/62 (!) 140/61  Pulse: 86 85 77 70  Resp: 18 19 17 17   Temp:   97.9 F (36.6 C) 98 F (36.7 C)  TempSrc:      SpO2: 97% 94% 98% 99%  Weight:      Height:       No intake or output data in the 24 hours ending 06/02/22 1517  Filed Weights   05/23/22 0811 05/26/22 2145  Weight: 61.2 kg 65.5 kg    Examination:  Constitutional:  VS as above General Appearance: alert, frail, NAD Respiratory: Normal respiratory effort No wheeze No rhonchi No rales Cardiovascular: S1/S2 normal No murmur No rub/gallop auscultated No lower extremity edema Gastrointestinal: No tenderness Musculoskeletal:  No clubbing/cyanosis of digits Symmetrical movement in all extremities Neurological: No cranial nerve deficit on limited exam Alert Psychiatric: Poor judgment/insight Calm mood and affect       Scheduled Medications:   ALPRAZolam  0.5 mg Oral Q8H   Chlorhexidine Gluconate  Cloth  6 each Topical Q0600   feeding supplement  237 mL Oral TID BM   folic acid  1 mg Oral Daily   multivitamin with minerals  1 tablet Oral Daily   pantoprazole  40 mg Oral BID   propranolol ER  60 mg Oral Daily   sodium chloride flush  10-40 mL Intracatheter Q12H   sodium chloride flush  10-40 mL Intracatheter Q12H   thiamine  100 mg Oral Daily   Or   thiamine  100 mg Intravenous Daily    Continuous Infusions:   PRN Medications:  acetaminophen, labetalol, LORazepam, ondansetron **OR** ondansetron (ZOFRAN) IV, mouth rinse, pseudoephedrine, risperiDONE, sodium chloride flush, sodium chloride flush, traZODone  Antimicrobials:  Anti-infectives (From admission, onward)    None       Data Reviewed: I have personally reviewed following labs and imaging studies  CBC: Recent Labs  Lab 05/30/22 0514  WBC 8.4  HGB 9.6*  HCT 30.1*  MCV 92.6  PLT 316   Basic Metabolic Panel: Recent Labs  Lab 05/30/22 0514  NA 134*  K 4.3  CL 102  CO2 26  GLUCOSE 151*  BUN 8  CREATININE 0.39*  CALCIUM 8.5*  GFR: Estimated Creatinine Clearance: 63.9 mL/min (A) (by C-G formula based on SCr of 0.39 mg/dL (L)). Liver Function Tests: No results for input(s): "AST", "ALT", "ALKPHOS", "BILITOT", "PROT", "ALBUMIN" in the last 168 hours.  No results for input(s): "LIPASE", "AMYLASE" in the last 168 hours. No results for input(s): "AMMONIA" in the last 168 hours. Coagulation Profile: No results for input(s): "INR", "PROTIME" in the last 168 hours. Cardiac Enzymes: No results for input(s): "CKTOTAL", "CKMB", "CKMBINDEX", "TROPONINI" in the last 168 hours. BNP (last 3 results) No results for input(s): "PROBNP" in the last 8760 hours. HbA1C: No results for input(s): "HGBA1C" in the last 72 hours. CBG: No results for input(s): "GLUCAP" in the last 168 hours. Lipid Profile: No results for input(s): "CHOL", "HDL", "LDLCALC", "TRIG", "CHOLHDL", "LDLDIRECT" in the last 72 hours. Thyroid  Function Tests: No results for input(s): "TSH", "T4TOTAL", "FREET4", "T3FREE", "THYROIDAB" in the last 72 hours. Anemia Panel: No results for input(s): "VITAMINB12", "FOLATE", "FERRITIN", "TIBC", "IRON", "RETICCTPCT" in the last 72 hours. Urine analysis:    Component Value Date/Time   COLORURINE AMBER (A) 05/23/2022 1400   APPEARANCEUR HAZY (A) 05/23/2022 1400   LABSPEC 1.016 05/23/2022 1400   PHURINE 6.0 05/23/2022 1400   GLUCOSEU NEGATIVE 05/23/2022 1400   HGBUR NEGATIVE 05/23/2022 1400   BILIRUBINUR NEGATIVE 05/23/2022 1400   KETONESUR 20 (A) 05/23/2022 1400   PROTEINUR NEGATIVE 05/23/2022 1400   NITRITE NEGATIVE 05/23/2022 1400   LEUKOCYTESUR NEGATIVE 05/23/2022 1400   Sepsis Labs: @LABRCNTIP (procalcitonin:4,lacticidven:4)  No results found for this or any previous visit (from the past 240 hour(s)).       Radiology Studies: US Abdomen Limited RUQ (LIVER/GB)  Result Date: 05/23/2022 CLINICAL DATA:  A 66 year old female presents with elevated liver enzymes. EXAM: ULTRASOUND ABDOMEN LIMITED RIGHT UPPER QUADRANT COMPARISON:  None Available. FINDINGS: Gallbladder: No gallstones or wall thickening visualized. No sonographic Murphy sign noted by sonographer. Common bile duct: Diameter: 5.5 mm Liver: No focal lesion identified. Mild parenchymal increased echogenicity. Portal vein is patent on color Doppler imaging with normal direction of blood flow towards the liver. Other: Limited study due to patient body habitus. Query RIGHT pleural effusion versus artifact. IMPRESSION: No acute biliary process. Limited evaluation of the liver with suggestion of mild hepatic steatosis. Query RIGHT-sided pleural effusion versus artifact. Electronically Signed   By: Zetta Bills M.D.   On: 05/23/2022 19:07   DG Shoulder Right  Result Date: 05/23/2022 CLINICAL DATA:  Status post fall, severe pain EXAM: RIGHT SHOULDER - 2+ VIEW COMPARISON:  None Available. FINDINGS: Generalized osteopenia. No acute  fracture or dislocation. No aggressive osseous lesion. Normal alignment. Soft tissue are unremarkable. No radiopaque foreign body or soft tissue emphysema. IMPRESSION: No acute osseous injury of the right shoulder. Electronically Signed   By: Kathreen Devoid M.D.   On: 05/23/2022 09:11   CT Head Wo Contrast  Result Date: 05/23/2022 CLINICAL DATA:  [place] 6 days ago. EXAM: CT HEAD WITHOUT CONTRAST CT MAXILLOFACIAL WITHOUT CONTRAST CT CERVICAL SPINE WITHOUT CONTRAST TECHNIQUE: Multidetector CT imaging of the head, cervical spine, and maxillofacial structures were performed using the standard protocol without intravenous contrast. Multiplanar CT image reconstructions of the cervical spine and maxillofacial structures were also generated. RADIATION DOSE REDUCTION: This exam was performed according to the departmental dose-optimization program which includes automated exposure control, adjustment of the mA and/or kV according to patient size and/or use of iterative reconstruction technique. COMPARISON:  Head CT 04/27/2022. Facial CT and cervical spine CT 02/12/2022 FINDINGS: CT HEAD FINDINGS Brain:  Stable age advanced cerebral atrophy, ventriculomegaly and periventricular white matter disease. No extra-axial fluid collections are identified. No CT findings for acute hemispheric infarction or intracranial hemorrhage. No mass lesions. The brainstem and cerebellum are normal. Vascular: Stable vascular calcifications. No aneurysm hyperdense vessels. Skull: No acute skull fracture or bone lesions. Left-sided facial bone fractures noted. Other: No scalp lesions or scalp hematoma. CT MAXILLOFACIAL FINDINGS Osseous: Comminuted and displaced fractures involving the anterior and posterolateral walls of left maxillary sinus. The sinus is filled with hematoma. Comminuted and displaced fracture of the left zygoma. The lateral orbital wall is fractured along with the orbital floor. No evidence of entrapment of the inferior rectus  muscle. The orbital roof is intact and the medial orbital wall is intact. No acute nasal bone fracture. No right-sided facial bone fractures. The mandibular condyles are normally located. No mandible fracture. Orbits: Left lateral orbital wall fracture and left orbital floor fracture. The left globe is intact. No intraorbital hematoma. Sinuses: The left maxillary sinus is filled with hematoma. The other paranasal sinuses and mastoid air cells are clear. Soft tissues: No significant findings. CT CERVICAL SPINE FINDINGS Alignment: Normal Skull base and vertebrae: No acute fracture. No primary bone lesion or focal pathologic process. Stable sclerotic lesion in the C4 vertebral body. Remote T3 fracture. Soft tissues and spinal canal: No prevertebral fluid or swelling. No visible canal hematoma. Disc levels: Degenerative cervical spondylosis with multilevel disc disease and facet disease but no large disc protrusions or significant canal compromise. There is multilevel foraminal stenosis due to uncinate spurring and facet disease. Upper chest: The lung apices are grossly clear. Other: Severe age advanced bilateral carotid artery calcifications. IMPRESSION: 1. Stable age advanced cerebral atrophy, ventriculomegaly and periventricular white matter disease. 2. No acute intracranial findings or skull fracture. 3. Comminuted and displaced fractures involving the anterior and posterolateral walls of the left maxillary sinus. 4. Comminuted and displaced fracture of the left zygoma. 5. Left lateral orbital wall fracture and left orbital floor fracture. No evidence of entrapment of the inferior rectus muscle. 6. No acute cervical spine fracture. 7. Degenerative cervical spondylosis with multilevel disc disease and facet disease but no large disc protrusions or significant canal compromise. 8. Severe age advanced bilateral carotid artery calcifications. Electronically Signed   By: Rudie MeyerP.  Gallerani M.D.   On: 05/23/2022 09:08   CT  Cervical Spine Wo Contrast  Result Date: 05/23/2022 CLINICAL DATA:  Larey SeatFell 6 days ago. EXAM: CT HEAD WITHOUT CONTRAST CT MAXILLOFACIAL WITHOUT CONTRAST CT CERVICAL SPINE WITHOUT CONTRAST TECHNIQUE: Multidetector CT imaging of the head, cervical spine, and maxillofacial structures were performed using the standard protocol without intravenous contrast. Multiplanar CT image reconstructions of the cervical spine and maxillofacial structures were also generated. RADIATION DOSE REDUCTION: This exam was performed according to the departmental dose-optimization program which includes automated exposure control, adjustment of the mA and/or kV according to patient size and/or use of iterative reconstruction technique. COMPARISON:  Head CT 04/27/2022. Facial CT and cervical spine CT 02/12/2022 FINDINGS: CT HEAD FINDINGS Brain: Stable age advanced cerebral atrophy, ventriculomegaly and periventricular white matter disease. No extra-axial fluid collections are identified. No CT findings for acute hemispheric infarction or intracranial hemorrhage. No mass lesions. The brainstem and cerebellum are normal. Vascular: Stable vascular calcifications. No aneurysm hyperdense vessels. Skull: No acute skull fracture or bone lesions. Left-sided facial bone fractures noted. Other: No scalp lesions or scalp hematoma. CT MAXILLOFACIAL FINDINGS Osseous: Comminuted and displaced fractures involving the anterior and posterolateral walls of left maxillary  sinus. The sinus is filled with hematoma. Comminuted and displaced fracture of the left zygoma. The lateral orbital wall is fractured along with the orbital floor. No evidence of entrapment of the inferior rectus muscle. The orbital roof is intact and the medial orbital wall is intact. No acute nasal bone fracture. No right-sided facial bone fractures. The mandibular condyles are normally located. No mandible fracture. Orbits: Left lateral orbital wall fracture and left orbital floor fracture.  The left globe is intact. No intraorbital hematoma. Sinuses: The left maxillary sinus is filled with hematoma. The other paranasal sinuses and mastoid air cells are clear. Soft tissues: No significant findings. CT CERVICAL SPINE FINDINGS Alignment: Normal Skull base and vertebrae: No acute fracture. No primary bone lesion or focal pathologic process. Stable sclerotic lesion in the C4 vertebral body. Remote T3 fracture. Soft tissues and spinal canal: No prevertebral fluid or swelling. No visible canal hematoma. Disc levels: Degenerative cervical spondylosis with multilevel disc disease and facet disease but no large disc protrusions or significant canal compromise. There is multilevel foraminal stenosis due to uncinate spurring and facet disease. Upper chest: The lung apices are grossly clear. Other: Severe age advanced bilateral carotid artery calcifications. IMPRESSION: 1. Stable age advanced cerebral atrophy, ventriculomegaly and periventricular white matter disease. 2. No acute intracranial findings or skull fracture. 3. Comminuted and displaced fractures involving the anterior and posterolateral walls of the left maxillary sinus. 4. Comminuted and displaced fracture of the left zygoma. 5. Left lateral orbital wall fracture and left orbital floor fracture. No evidence of entrapment of the inferior rectus muscle. 6. No acute cervical spine fracture. 7. Degenerative cervical spondylosis with multilevel disc disease and facet disease but no large disc protrusions or significant canal compromise. 8. Severe age advanced bilateral carotid artery calcifications. Electronically Signed   By: Rudie Meyer M.D.   On: 05/23/2022 09:08   CT Maxillofacial WO CM  Result Date: 05/23/2022 CLINICAL DATA:  Larey Seat 6 days ago. EXAM: CT HEAD WITHOUT CONTRAST CT MAXILLOFACIAL WITHOUT CONTRAST CT CERVICAL SPINE WITHOUT CONTRAST TECHNIQUE: Multidetector CT imaging of the head, cervical spine, and maxillofacial structures were  performed using the standard protocol without intravenous contrast. Multiplanar CT image reconstructions of the cervical spine and maxillofacial structures were also generated. RADIATION DOSE REDUCTION: This exam was performed according to the departmental dose-optimization program which includes automated exposure control, adjustment of the mA and/or kV according to patient size and/or use of iterative reconstruction technique. COMPARISON:  Head CT 04/27/2022. Facial CT and cervical spine CT 02/12/2022 FINDINGS: CT HEAD FINDINGS Brain: Stable age advanced cerebral atrophy, ventriculomegaly and periventricular white matter disease. No extra-axial fluid collections are identified. No CT findings for acute hemispheric infarction or intracranial hemorrhage. No mass lesions. The brainstem and cerebellum are normal. Vascular: Stable vascular calcifications. No aneurysm hyperdense vessels. Skull: No acute skull fracture or bone lesions. Left-sided facial bone fractures noted. Other: No scalp lesions or scalp hematoma. CT MAXILLOFACIAL FINDINGS Osseous: Comminuted and displaced fractures involving the anterior and posterolateral walls of left maxillary sinus. The sinus is filled with hematoma. Comminuted and displaced fracture of the left zygoma. The lateral orbital wall is fractured along with the orbital floor. No evidence of entrapment of the inferior rectus muscle. The orbital roof is intact and the medial orbital wall is intact. No acute nasal bone fracture. No right-sided facial bone fractures. The mandibular condyles are normally located. No mandible fracture. Orbits: Left lateral orbital wall fracture and left orbital floor fracture. The left globe is intact.  No intraorbital hematoma. Sinuses: The left maxillary sinus is filled with hematoma. The other paranasal sinuses and mastoid air cells are clear. Soft tissues: No significant findings. CT CERVICAL SPINE FINDINGS Alignment: Normal Skull base and vertebrae: No  acute fracture. No primary bone lesion or focal pathologic process. Stable sclerotic lesion in the C4 vertebral body. Remote T3 fracture. Soft tissues and spinal canal: No prevertebral fluid or swelling. No visible canal hematoma. Disc levels: Degenerative cervical spondylosis with multilevel disc disease and facet disease but no large disc protrusions or significant canal compromise. There is multilevel foraminal stenosis due to uncinate spurring and facet disease. Upper chest: The lung apices are grossly clear. Other: Severe age advanced bilateral carotid artery calcifications. IMPRESSION: 1. Stable age advanced cerebral atrophy, ventriculomegaly and periventricular white matter disease. 2. No acute intracranial findings or skull fracture. 3. Comminuted and displaced fractures involving the anterior and posterolateral walls of the left maxillary sinus. 4. Comminuted and displaced fracture of the left zygoma. 5. Left lateral orbital wall fracture and left orbital floor fracture. No evidence of entrapment of the inferior rectus muscle. 6. No acute cervical spine fracture. 7. Degenerative cervical spondylosis with multilevel disc disease and facet disease but no large disc protrusions or significant canal compromise. 8. Severe age advanced bilateral carotid artery calcifications. Electronically Signed   By: Rudie Meyer M.D.   On: 05/23/2022 09:08            LOS: 10 days      Sunnie Nielsen, DO [facility] 06/02/2022, 3:17 PM   Staff may message me via secure chat in Epic  but this may not receive immediate response,  please page for urgent matters!  If 7PM-7AM, please contact night-coverage [email or web address]  Dictation software was used to generate the above note. Typos may occur and escape review, as with typed/written notes. Please contact Dr Lyn Hollingshead directly for clarity if needed.

## 2022-06-02 NOTE — Consult Note (Addendum)
[facility] Nurse Consult Note: Reason for Consult:laceration to right parietal area. [facility] Nursing is simultaneous consulted with APP and APP saw lesions on 05/31/22. See Encounter note from NP B. Randol Kern. Wound type:Trauma Pressure Injury POA:N/A Measurement:Per Bedside RN and noted in Ms. Morrison's note: 2.2cm x 0.6cm x 0.2cm Dressing procedure/placement/frequency: Patient seen for this laceration on 05/31/22 by APP B. Randol Kern, NP. See her note from that encounter. Orders placed for the care of the right parietal laceration included to clip hair in area, cleanse with chlorhexidine solution , rinse and dry, then approximate wound edges with steri-strips. Orders were not transcribed as Nursing Orders, so I have done this this evening. If these care has already been provided, please disregard. Steri-strips may be left in place until they lift and fall off. No further topical care is indicated for the right parietal lesion.  Pressure injury prevention strategies are implemented today including but not limited to placement of a sacral foam, floatation of heels, and turning and repositioning to minimize time in the supine position.  [facility] nursing team will not follow, but will remain available to this patient, the nursing and medical teams.  Please re-consult if needed.  Thank you for inviting [place] to participate in this patient's Plan of Care.  Maudie Flakes, MSN, RN, CNS, [place], Serita Grammes, [employer], [employer] phone:  [phone]

## 2022-06-03 LAB — CBC
HCT: 35 % — ABNORMAL LOW (ref 36.0–46.0)
Hemoglobin: 11 g/dL — ABNORMAL LOW (ref 12.0–15.0)
MCH: 29.7 pg (ref 26.0–34.0)
MCHC: 31.4 g/dL (ref 30.0–36.0)
MCV: 94.6 fL (ref 80.0–100.0)
Platelets: 390 10*3/uL (ref 150–400)
RBC: 3.7 MIL/uL — ABNORMAL LOW (ref 3.87–5.11)
RDW: 17.2 % — ABNORMAL HIGH (ref 11.5–15.5)
WBC: 8.3 10*3/uL (ref 4.0–10.5)
nRBC: 0 % (ref 0.0–0.2)

## 2022-06-03 LAB — BASIC METABOLIC PANEL
Anion gap: 5 (ref 5–15)
BUN: 12 mg/dL (ref 8–23)
CO2: 24 mmol/L (ref 22–32)
Calcium: 8.8 mg/dL — ABNORMAL LOW (ref 8.9–10.3)
Chloride: 109 mmol/L (ref 98–111)
Creatinine, Ser: 0.48 mg/dL (ref 0.44–1.00)
GFR, Estimated: >60 mL/min (ref >60)
Glucose, Bld: 180 mg/dL — ABNORMAL HIGH (ref 70–99)
Potassium: 3.7 mmol/L (ref 3.5–5.1)
Sodium: 138 mmol/L (ref 135–145)

## 2022-06-03 MED ORDER — LORAZEPAM 0.5 MG PO TABS: 0.5000 mg | ORAL_TABLET | Freq: Four times a day (QID) | ORAL | Status: DC | PRN

## 2022-06-03 NOTE — Care Management Important Message (Signed)
Important Message  Patient Details  Name: Stephanie Skinner MRN: [id number] Date of Birth: [birth year]   Medicare Important Message Given:  Other (see comment)  Patient left AMA.  Concurrent Medicare IM not delivered at this time.   Dannette Barbara 06/03/2022, 11:47 AM

## 2022-06-03 NOTE — Progress Notes (Signed)
Patient signed AMA paper at 11am.  She pulled out her own IV.  RN checked arm and it was not bleeding.  Patient refused wheelchair and walked out with family friend.    MD aware.

## 2022-06-03 NOTE — Discharge Summary (Signed)
Physician Discharge Summary   Patient: Stephanie Skinner MRN: [id number]  DOB: 14-Mar-1956   Admit:     Date of Admission: 05/23/2022 Admitted from: home   Discharge: Date of discharge: 06/03/22 PATIENT [name]  Disposition: Home Condition at discharge: fair  CODE STATUS: FULL CODE     Discharge Physician: Emeterio Reeve, DO [facility]     PCP: Stephanie Josephs, MD  Recommendations for Outpatient Follow-up:  FOLLOW W/ PCP       Discharge Diagnoses: Principal Problem:   Benzodiazepine abuse ([place]) Active Problems:   ABLA (acute blood loss anemia)   Alcohol abuse   Essential hypertension   Frequent falls   Malnutrition of moderate degree   Closed fracture of left zygomatic arch (HCC)   Fracture of left orbital wall [facility])   Gastrointestinal hemorrhage       Hospital Course: Stephanie Skinner is a 66 y.o. female with medical history significant for benzodiazepine dependence, alcohol dependence, hypertension, recent hospital discharge on 05/03/22 following admission for acute metabolic encephalopathy secondary to alcohol and benzodiazepine withdrawal complicated by delirium and COVID-19 infection. Patient was discharged home in stable condition and according to her roommate she started taking the 'pills" again.  He notes that she has been increasingly lethargic and has had frequent falls.  He called EMS on the day of admission after she fell landing on her face. 09/28: To ED/admitted to hospitalist service.  Significantly anemic, Hgb 5.7.  Heme positive stool per ER physician.  Received 2 units PRBC. 9/29 s/p EGD, no acute findings. No further w/u per GI. 9/30 scoring on ciwa protocal.  10/1 scoring 21 on CIWA protocol.  This happened this AM.  In mittens. Now on exam awakens and responds appropriately, but falls back asleep. Told her breakfast is here and she wants to eat. 10/2 patient was awake and alert this a.m.  Answered  questions. "This is probably the most awake and I am seeing her this week.  After I had seen her she was scoring 14 on the CIWA protocol." Later patient was found on the floor confused and combative.  Patient apparently denied hitting her head but were not sure.  CT of the head ordered.  Daughter updated. Psychiatry consulted per daughters request -not meeting criteria for psychiatric inpatient admission, ideally would plan to taper off of Xanax entirely, suggest discontinuation per her outpatient physician.  10/3 still scoring on CIWA protocol.  10/4: on CIWA, tremor and anxiety seem to be improving. 10/5-10/7: remains stable, working on SNF placement  10/8: pt a bit more alert but still intermittently confused. [facility] TOC notes - pt refusing SNF, family undecided / may consider HH. Awaiting this determination from family prior to dispo  10/9: [place]. I attempted to call son 06/03/22 2:49 PM and went to voicemail. No further attempts will be made.     Consultants:  GI Psychiatry   Procedures: EGD 05/24/2022      ASSESSMENT & PLAN:   Principal Problem:   Benzodiazepine abuse ([place]) Active Problems:   ABLA (acute blood loss anemia)   Alcohol abuse   Essential hypertension   Frequent falls   Malnutrition of moderate degree  ABLA (acute blood loss anemia) Patient presented to the ER for evaluation of a fall and noted to have a hemoglobin of 5.7, vs her baseline of 12.4 She has had occasional black stools and admits to NSAID use. Transfused 2  units of packed RBC 9/29 s/p EGD - unremarkable H/H has been stable, 05/30/22 is 9.6 Serial CBC   Alcohol abuse Xanax abuse On folate and thiamine Vitamin B12 elevated 10/3 still scoring on ciwa protocol, better 10/4 into 10/5 and 10/6 Psych consulted input was appreciated - There is no indication currently to add any psychiatric medicine to what the patient is taking.  Also no indication for transfer  at this time to the inpatient geriatric psychiatry unit. PLEASE SEE FULL CONSULT REPORT   Benzodiazepine abuse/dependence (HCC) Patient has a history of benzodiazepine dependence/abuse and was recently hospitalized for benzodiazepine withdrawal with delirium. She was seen by psychiatry during that hospitalization and was discharged on Klonopin and citalopram but according to her daughter she was started back on Xanax by her outpatient psychiatrist. Patient is at risk of going through withdrawal again. Per daughter she took "11 xanax in just few days". 10/3 psych started pt on xanax 0.5 mg tid. Advise slow taper downward outpatient    Essential hypertension Held home BP meds amlodipine Started propranolol today given BP creeping up - BB may help prevent headaches, help w/ anxiety    Frequent falls Facial fractures: Left maxillary sinus, left zygoma, left lateral orbital wall/orbital floor 10/2 was found on the floor. 10/3-10/6 sitter at bedside. Pain control Fall precautions PT/OT rec. SNF  Headache Mild, requesting fiorinal Ordered toradol yesterday  Monitor               Discharge Instructions  Allergies as of 06/03/2022       Reactions   Codeine Nausea And Vomiting   Morphine And Related Other (See Comments)   seizures   Sulfa Antibiotics Nausea And Vomiting            Allergies  Allergen Reactions   Codeine Nausea And Vomiting   Morphine And Related Other (See Comments)    seizures   Sulfa Antibiotics Nausea And Vomiting     Subjective: Pt asks about discharge this morning, has no complaints    Discharge Exam: based on rounds this morning BP (!) 144/67 (BP Location: Left Arm)   Pulse 80   Temp 97.9 F (36.6 C)   Resp 16   Ht  (1.676 m)   Wt 65.5 kg   SpO2 99%   BMI 23.31 kg/m  General: Pt is alert, awake, not in acute distress Cardiovascular: RRR, S1/S2 +, no rubs, no gallops Respiratory: CTA bilaterally, no wheezing, no  rhonchi Abdominal: Soft, NT, ND, bowel sounds + Extremities: no edema, no cyanosis     The results of significant diagnostics from this hospitalization (including imaging, microbiology, ancillary and laboratory) are listed below for reference.     Microbiology: No results found for this or any previous visit (from the past 240 hour(s)).   Labs: BNP (last 3 results) No results for input(s): "BNP" in the last 8760 hours. Basic Metabolic Panel: Recent Labs  Lab 05/30/22 0514 06/03/22 0409  NA 134* 138  K 4.3 3.7  CL 102 109  CO2 26 24  GLUCOSE 151* 180*  BUN 8 12  CREATININE 0.39* 0.48  CALCIUM 8.5* 8.8*   Liver Function Tests: No results for input(s): "AST", "ALT", "ALKPHOS", "BILITOT", "PROT", "ALBUMIN" in the last 168 hours. No results for input(s): "LIPASE", "AMYLASE" in the last 168 hours. No results for input(s): "AMMONIA" in the last 168 hours. CBC: Recent Labs  Lab 05/30/22 0514 06/03/22 0409  WBC 8.4 8.3  HGB 9.6* 11.0*  HCT 30.1*  35.0*  MCV 92.6 94.6  PLT 316 390   Cardiac Enzymes: No results for input(s): "CKTOTAL", "CKMB", "CKMBINDEX", "TROPONINI" in the last 168 hours. BNP: Invalid input(s): "POCBNP" CBG: No results for input(s): "GLUCAP" in the last 168 hours. D-Dimer No results for input(s): "DDIMER" in the last 72 hours. Hgb A1c No results for input(s): "HGBA1C" in the last 72 hours. Lipid Profile No results for input(s): "CHOL", "HDL", "LDLCALC", "TRIG", "CHOLHDL", "LDLDIRECT" in the last 72 hours. Thyroid function studies No results for input(s): "TSH", "T4TOTAL", "T3FREE", "THYROIDAB" in the last 72 hours.  Invalid input(s): "FREET3" Anemia work up No results for input(s): "VITAMINB12", "FOLATE", "FERRITIN", "TIBC", "IRON", "RETICCTPCT" in the last 72 hours. Urinalysis    Component Value Date/Time   COLORURINE AMBER (A) 05/23/2022 1400   APPEARANCEUR HAZY (A) 05/23/2022 1400   LABSPEC 1.016 05/23/2022 1400   PHURINE 6.0 05/23/2022  1400   GLUCOSEU NEGATIVE 05/23/2022 1400   HGBUR NEGATIVE 05/23/2022 1400   BILIRUBINUR NEGATIVE 05/23/2022 1400   KETONESUR 20 (A) 05/23/2022 1400   PROTEINUR NEGATIVE 05/23/2022 1400   NITRITE NEGATIVE 05/23/2022 1400   LEUKOCYTESUR NEGATIVE 05/23/2022 1400   Sepsis Labs Recent Labs  Lab 05/30/22 0514 06/03/22 0409  WBC 8.4 8.3   Microbiology No results found for this or any previous visit (from the past 240 hour(s)). Imaging US Abdomen Limited RUQ (LIVER/GB)  Result Date: 05/23/2022 CLINICAL DATA:  A 66 year old female presents with elevated liver enzymes. EXAM: ULTRASOUND ABDOMEN LIMITED RIGHT UPPER QUADRANT COMPARISON:  None Available. FINDINGS: Gallbladder: No gallstones or wall thickening visualized. No sonographic Murphy sign noted by sonographer. Common bile duct: Diameter: 5.5 mm Liver: No focal lesion identified. Mild parenchymal increased echogenicity. Portal vein is patent on color Doppler imaging with normal direction of blood flow towards the liver. Other: Limited study due to patient body habitus. Query RIGHT pleural effusion versus artifact. IMPRESSION: No acute biliary process. Limited evaluation of the liver with suggestion of mild hepatic steatosis. Query RIGHT-sided pleural effusion versus artifact. Electronically Signed   By: Donzetta KohutGeoffrey  Wile M.D.   On: 05/23/2022 19:07   DG Shoulder Right  Result Date: 05/23/2022 CLINICAL DATA:  Status post fall, severe pain EXAM: RIGHT SHOULDER - 2+ VIEW COMPARISON:  None Available. FINDINGS: Generalized osteopenia. No acute fracture or dislocation. No aggressive osseous lesion. Normal alignment. Soft tissue are unremarkable. No radiopaque foreign body or soft tissue emphysema. IMPRESSION: No acute osseous injury of the right shoulder. Electronically Signed   By: Elige KoHetal  Patel M.D.   On: 05/23/2022 09:11   CT Head Wo Contrast  Result Date: 05/23/2022 CLINICAL DATA:  Larey SeatFell 6 days ago. EXAM: CT HEAD WITHOUT CONTRAST CT MAXILLOFACIAL  WITHOUT CONTRAST CT CERVICAL SPINE WITHOUT CONTRAST TECHNIQUE: Multidetector CT imaging of the head, cervical spine, and maxillofacial structures were performed using the standard protocol without intravenous contrast. Multiplanar CT image reconstructions of the cervical spine and maxillofacial structures were also generated. RADIATION DOSE REDUCTION: This exam was performed according to the departmental dose-optimization program which includes automated exposure control, adjustment of the mA and/or kV according to patient size and/or use of iterative reconstruction technique. COMPARISON:  Head CT 04/27/2022. Facial CT and cervical spine CT 02/12/2022 FINDINGS: CT HEAD FINDINGS Brain: Stable age advanced cerebral atrophy, ventriculomegaly and periventricular white matter disease. No extra-axial fluid collections are identified. No CT findings for acute hemispheric infarction or intracranial hemorrhage. No mass lesions. The brainstem and cerebellum are normal. Vascular: Stable vascular calcifications. No aneurysm hyperdense vessels. Skull: No acute skull  fracture or bone lesions. Left-sided facial bone fractures noted. Other: No scalp lesions or scalp hematoma. CT MAXILLOFACIAL FINDINGS Osseous: Comminuted and displaced fractures involving the anterior and posterolateral walls of left maxillary sinus. The sinus is filled with hematoma. Comminuted and displaced fracture of the left zygoma. The lateral orbital wall is fractured along with the orbital floor. No evidence of entrapment of the inferior rectus muscle. The orbital roof is intact and the medial orbital wall is intact. No acute nasal bone fracture. No right-sided facial bone fractures. The mandibular condyles are normally located. No mandible fracture. Orbits: Left lateral orbital wall fracture and left orbital floor fracture. The left globe is intact. No intraorbital hematoma. Sinuses: The left maxillary sinus is filled with hematoma. The other paranasal  sinuses and mastoid air cells are clear. Soft tissues: No significant findings. CT CERVICAL SPINE FINDINGS Alignment: Normal Skull base and vertebrae: No acute fracture. No primary bone lesion or focal pathologic process. Stable sclerotic lesion in the C4 vertebral body. Remote T3 fracture. Soft tissues and spinal canal: No prevertebral fluid or swelling. No visible canal hematoma. Disc levels: Degenerative cervical spondylosis with multilevel disc disease and facet disease but no large disc protrusions or significant canal compromise. There is multilevel foraminal stenosis due to uncinate spurring and facet disease. Upper chest: The lung apices are grossly clear. Other: Severe age advanced bilateral carotid artery calcifications. IMPRESSION: 1. Stable age advanced cerebral atrophy, ventriculomegaly and periventricular white matter disease. 2. No acute intracranial findings or skull fracture. 3. Comminuted and displaced fractures involving the anterior and posterolateral walls of the left maxillary sinus. 4. Comminuted and displaced fracture of the left zygoma. 5. Left lateral orbital wall fracture and left orbital floor fracture. No evidence of entrapment of the inferior rectus muscle. 6. No acute cervical spine fracture. 7. Degenerative cervical spondylosis with multilevel disc disease and facet disease but no large disc protrusions or significant canal compromise. 8. Severe age advanced bilateral carotid artery calcifications. Electronically Signed   By: Rudie Meyer M.D.   On: 05/23/2022 09:08   CT Cervical Spine Wo Contrast  Result Date: 05/23/2022 CLINICAL DATA:  Larey Seat 6 days ago. EXAM: CT HEAD WITHOUT CONTRAST CT MAXILLOFACIAL WITHOUT CONTRAST CT CERVICAL SPINE WITHOUT CONTRAST TECHNIQUE: Multidetector CT imaging of the head, cervical spine, and maxillofacial structures were performed using the standard protocol without intravenous contrast. Multiplanar CT image reconstructions of the cervical spine and  maxillofacial structures were also generated. RADIATION DOSE REDUCTION: This exam was performed according to the departmental dose-optimization program which includes automated exposure control, adjustment of the mA and/or kV according to patient size and/or use of iterative reconstruction technique. COMPARISON:  Head CT 04/27/2022. Facial CT and cervical spine CT 02/12/2022 FINDINGS: CT HEAD FINDINGS Brain: Stable age advanced cerebral atrophy, ventriculomegaly and periventricular white matter disease. No extra-axial fluid collections are identified. No CT findings for acute hemispheric infarction or intracranial hemorrhage. No mass lesions. The brainstem and cerebellum are normal. Vascular: Stable vascular calcifications. No aneurysm hyperdense vessels. Skull: No acute skull fracture or bone lesions. Left-sided facial bone fractures noted. Other: No scalp lesions or scalp hematoma. CT MAXILLOFACIAL FINDINGS Osseous: Comminuted and displaced fractures involving the anterior and posterolateral walls of left maxillary sinus. The sinus is filled with hematoma. Comminuted and displaced fracture of the left zygoma. The lateral orbital wall is fractured along with the orbital floor. No evidence of entrapment of the inferior rectus muscle. The orbital roof is intact and the medial orbital wall is intact. No  acute nasal bone fracture. No right-sided facial bone fractures. The mandibular condyles are normally located. No mandible fracture. Orbits: Left lateral orbital wall fracture and left orbital floor fracture. The left globe is intact. No intraorbital hematoma. Sinuses: The left maxillary sinus is filled with hematoma. The other paranasal sinuses and mastoid air cells are clear. Soft tissues: No significant findings. CT CERVICAL SPINE FINDINGS Alignment: Normal Skull base and vertebrae: No acute fracture. No primary bone lesion or focal pathologic process. Stable sclerotic lesion in the C4 vertebral body. Remote T3  fracture. Soft tissues and spinal canal: No prevertebral fluid or swelling. No visible canal hematoma. Disc levels: Degenerative cervical spondylosis with multilevel disc disease and facet disease but no large disc protrusions or significant canal compromise. There is multilevel foraminal stenosis due to uncinate spurring and facet disease. Upper chest: The lung apices are grossly clear. Other: Severe age advanced bilateral carotid artery calcifications. IMPRESSION: 1. Stable age advanced cerebral atrophy, ventriculomegaly and periventricular white matter disease. 2. No acute intracranial findings or skull fracture. 3. Comminuted and displaced fractures involving the anterior and posterolateral walls of the left maxillary sinus. 4. Comminuted and displaced fracture of the left zygoma. 5. Left lateral orbital wall fracture and left orbital floor fracture. No evidence of entrapment of the inferior rectus muscle. 6. No acute cervical spine fracture. 7. Degenerative cervical spondylosis with multilevel disc disease and facet disease but no large disc protrusions or significant canal compromise. 8. Severe age advanced bilateral carotid artery calcifications. Electronically Signed   By: Rudie Meyer M.D.   On: 05/23/2022 09:08   CT Maxillofacial WO CM  Result Date: 05/23/2022 CLINICAL DATA:  Larey Seat 6 days ago. EXAM: CT HEAD WITHOUT CONTRAST CT MAXILLOFACIAL WITHOUT CONTRAST CT CERVICAL SPINE WITHOUT CONTRAST TECHNIQUE: Multidetector CT imaging of the head, cervical spine, and maxillofacial structures were performed using the standard protocol without intravenous contrast. Multiplanar CT image reconstructions of the cervical spine and maxillofacial structures were also generated. RADIATION DOSE REDUCTION: This exam was performed according to the departmental dose-optimization program which includes automated exposure control, adjustment of the mA and/or kV according to patient size and/or use of iterative reconstruction  technique. COMPARISON:  Head CT 04/27/2022. Facial CT and cervical spine CT 02/12/2022 FINDINGS: CT HEAD FINDINGS Brain: Stable age advanced cerebral atrophy, ventriculomegaly and periventricular white matter disease. No extra-axial fluid collections are identified. No CT findings for acute hemispheric infarction or intracranial hemorrhage. No mass lesions. The brainstem and cerebellum are normal. Vascular: Stable vascular calcifications. No aneurysm hyperdense vessels. Skull: No acute skull fracture or bone lesions. Left-sided facial bone fractures noted. Other: No scalp lesions or scalp hematoma. CT MAXILLOFACIAL FINDINGS Osseous: Comminuted and displaced fractures involving the anterior and posterolateral walls of left maxillary sinus. The sinus is filled with hematoma. Comminuted and displaced fracture of the left zygoma. The lateral orbital wall is fractured along with the orbital floor. No evidence of entrapment of the inferior rectus muscle. The orbital roof is intact and the medial orbital wall is intact. No acute nasal bone fracture. No right-sided facial bone fractures. The mandibular condyles are normally located. No mandible fracture. Orbits: Left lateral orbital wall fracture and left orbital floor fracture. The left globe is intact. No intraorbital hematoma. Sinuses: The left maxillary sinus is filled with hematoma. The other paranasal sinuses and mastoid air cells are clear. Soft tissues: No significant findings. CT CERVICAL SPINE FINDINGS Alignment: Normal Skull base and vertebrae: No acute fracture. No primary bone lesion or focal pathologic process.  Stable sclerotic lesion in the C4 vertebral body. Remote T3 fracture. Soft tissues and spinal canal: No prevertebral fluid or swelling. No visible canal hematoma. Disc levels: Degenerative cervical spondylosis with multilevel disc disease and facet disease but no large disc protrusions or significant canal compromise. There is multilevel foraminal  stenosis due to uncinate spurring and facet disease. Upper chest: The lung apices are grossly clear. Other: Severe age advanced bilateral carotid artery calcifications. IMPRESSION: 1. Stable age advanced cerebral atrophy, ventriculomegaly and periventricular white matter disease. 2. No acute intracranial findings or skull fracture. 3. Comminuted and displaced fractures involving the anterior and posterolateral walls of the left maxillary sinus. 4. Comminuted and displaced fracture of the left zygoma. 5. Left lateral orbital wall fracture and left orbital floor fracture. No evidence of entrapment of the inferior rectus muscle. 6. No acute cervical spine fracture. 7. Degenerative cervical spondylosis with multilevel disc disease and facet disease but no large disc protrusions or significant canal compromise. 8. Severe age advanced bilateral carotid artery calcifications. Electronically Signed   By: Rudie Meyer M.D.   On: 05/23/2022 09:08      Time coordinating discharge: less than 30 minutes  SIGNED:  Sunnie Nielsen DO [facility]

## 2022-06-03 NOTE — Progress Notes (Addendum)
CSW attempted to speak with patient regarding plan as patient was refusing SNF over the weekend. Patietn reports she is going home, aware that she is not discharged from the hospital and is going against medical advice. CSW inquired as to why, she reports that her husband Huston Foley ([phone]) is going with hospice at 1pm today and she was trying to get home to be with him. As CSW was discussing this with her, her firend Alison Murray arrived and patient said she was leaving with her.   CSW called son Marland Kitchen to update as well. He is requesting call from MD.  MD and RN informed.   [place], [place]

## 2023-02-19 ENCOUNTER — Other Ambulatory Visit: Payer: Self-pay

## 2023-02-19 ENCOUNTER — Encounter: Payer: Self-pay | Admitting: Emergency Medicine

## 2023-02-19 ENCOUNTER — Emergency Department
Admission: EM | Admit: 2023-02-19 | Discharge: 2023-02-19 | Disposition: A | Discharge status: Home / Self Care | Payer: Medicare HMO | Attending: Emergency Medicine | Admitting: Emergency Medicine

## 2023-02-19 DIAGNOSIS — F419 Anxiety disorder, unspecified: Secondary | ICD-10-CM | POA: Insufficient documentation

## 2023-02-19 DIAGNOSIS — R000 Tachycardia, unspecified: Secondary | ICD-10-CM | POA: Diagnosis not present

## 2023-02-19 DIAGNOSIS — Z760 Encounter for issue of repeat prescription: Secondary | ICD-10-CM | POA: Diagnosis not present

## 2023-02-19 DIAGNOSIS — F410 Panic disorder [episodic paroxysmal anxiety] without agoraphobia: Secondary | ICD-10-CM

## 2023-02-19 MED ORDER — BUPRENORPHINE HCL-NALOXONE HCL 8-2 MG SL SUBL
1.0000 | SUBLINGUAL_TABLET | Freq: Once | SUBLINGUAL | Status: AC
  Administered 2023-02-19: 1 via SUBLINGUAL
  Filled 2023-02-19: qty 1

## 2023-02-19 MED ORDER — BUPRENORPHINE HCL-NALOXONE HCL 8-2 MG SL FILM
1.0000 | ORAL_FILM | Freq: Two times a day (BID) | SUBLINGUAL | 0 refills | Status: AC
  Filled 2023-02-19: qty 4, 2d supply, fill #0

## 2023-02-19 MED ORDER — DIAZEPAM 5 MG PO TABS
10.0000 mg | ORAL_TABLET | Freq: Once | ORAL | Status: AC
  Administered 2023-02-19: 10 mg via ORAL
  Filled 2023-02-19: qty 2

## 2023-02-19 MED ORDER — ALPRAZOLAM 1 MG PO TABS
1.0000 mg | ORAL_TABLET | Freq: Three times a day (TID) | ORAL | 0 refills | Status: AC
  Filled 2023-02-19: qty 6, 2d supply, fill #0

## 2023-02-19 NOTE — Discharge Instructions (Signed)
Pick up your prescription at the pharmacy and take as directed.  See your doctor this week for a follow-up appointment.

## 2023-02-19 NOTE — ED Provider Notes (Addendum)
[facility] Provider Note    Event Date/Time   First MD Initiated Contact with Patient 02/19/23 1207     (approximate)   History   Medication Refill and Anxiety   HPI  Stephanie Skinner is a 67 y.o. female   Past medical history of generalized anxiety disorder, polysubstance use, alcohol use, here with medication refill and anxiety.  She states that she is unable to get to her pharmacy to pick up her refill of her Suboxone and Xanax.  She takes Xanax 3 times a day.  She takes Suboxone twice daily.  She has been 2 days without her medications.  She has no other acute medical complaints.  She has a plan for her girlfriend to drive her to the pharmacy tomorrow.   External Medical Documents Reviewed: PDMP review notes prescriptions approximately 1 month ago for Xanax and Suboxone consistent with her reported use of 2 Suboxone's per day and 3 times daily Xanax.      Physical Exam   Triage Vital Signs: ED Triage Vitals  Enc Vitals Group     BP 02/19/23 0841 (!) 144/85     Pulse Rate 02/19/23 0841 (!) 108     Resp 02/19/23 0841 20     Temp 02/19/23 0841 98.7 F (37.1 C)     Temp Source 02/19/23 0841 Oral     SpO2 02/19/23 0841 98 %     Weight 02/19/23 0842 130 lb (59 kg)     Height 02/19/23 0842 5\' 6"  (1.676 m)     Head Circumference --      Peak Flow --      Pain Score 02/19/23 0848 0     Pain Loc --      Pain Edu? --      Excl. in GC? --     Most recent vital signs: Vitals:   02/19/23 0841  BP: (!) 144/85  Pulse: (!) 108  Resp: 20  Temp: 98.7 F (37.1 C)  SpO2: 98%    General: Awake, no distress.  CV:  Good peripheral perfusion.  Resp:  Normal effort.  Abd:  No distention.  Other:  Anxious appearing, mild tachycardia, no significant tremors or tongue fasciculation.  Alert and oriented.   ED Results / Procedures / Treatments   Labs (all labs ordered are listed, but only abnormal results are displayed) Labs Reviewed - No data to  display   EKG  ED ECG REPORT I, Pilar Jarvis, the attending physician, personally viewed and interpreted this ECG.   Date: 02/19/2023  EKG Time: 0854  Rate: 111  Rhythm: sinus tachycardia  Axis: nl  Intervals:none  ST&T Change: no stemi    PROCEDURES:  Critical Care performed: No  Procedures   MEDICATIONS ORDERED IN ED: Medications  diazepam (VALIUM) tablet 10 mg (has no administration in time range)  buprenorphine-naloxone (SUBOXONE) 8-2 mg per SL tablet 1 tablet (has no administration in time range)    IMPRESSION / MDM / ASSESSMENT AND PLAN / ED COURSE  I reviewed the triage vital signs and the nursing notes.                                Patient's presentation is most consistent with acute presentation with potential threat to life or bodily function.  Differential diagnosis includes, but is not limited to, encounter for medication refill, opiate withdrawal, benzodiazepine withdrawal, anxiety   The patient  is on the cardiac monitor to evaluate for evidence of arrhythmia and/or significant heart rate changes.  MDM:   Patient requesting a dose of her medications Suboxone and benzodiazepine because she does not have access to transportation to fill her prescription which is awaiting her at the pharmacy.  She does not appear to be in acute withdrawal at this time.  I will give her Suboxone and a longer acting benzodiazepine and she will get to the pharmacy either this evening or tomorrow.   --- I wrote a 2-day prescription for Suboxone and Xanax to be filled at our hospital pharmacy, given that the patient is unsure whether she will make it to the pharmacy today or tomorrow with a ride from her friend.  I reassessed her at this time and she is calm and not exhibiting any signs of acute withdrawal.       FINAL CLINICAL IMPRESSION(S) / ED DIAGNOSES   Final diagnoses:  Medication refill  Anxiety attack     Rx / DC Orders   ED Discharge Orders     None         Note:  This document was prepared using Dragon voice recognition software and may include unintentional dictation errors.    Pilar Jarvis, MD 02/19/23 1230    Pilar Jarvis, MD 02/19/23 [phone]

## 2023-02-19 NOTE — ED Triage Notes (Signed)
First Nurse Note;  Pt via [facility] from home. Pt c/o panic attack. Pt ran out of her Suboxone and Xanax 2 days ago. ST on the monitor. Also has not take her HTN. Pt is A&Ox4 and NAD  BP 152/90  98% on RA  108 HR  99 CBG

## 2023-02-19 NOTE — ED Triage Notes (Signed)
Pt via [facility] from home. Pt here stating she has been out her medication for 2 days, states she started having a panic attack. Pt c/o CP and shakiness. Pt has been out of her HTN medication, Suboxone, and Xanax. Pt is A&Ox4 and NAD, pt tearful during.

## 2023-02-19 NOTE — ED Provider Triage Note (Signed)
Emergency Medicine Provider Triage Evaluation Note  Stephanie Skinner , a 67 y.o. female  was evaluated in triage.  Pt complains of out of suboxone and xanax for 2 days, states having severe anxiety attack, no si/hi.  Review of Systems  Positive:  Negative:   Physical Exam  BP (!) 144/85 (BP Location: Right Arm)   Pulse (!) 108   Temp 98.7 F (37.1 C) (Oral)   Resp 20   Ht 5\' 6"  (1.676 m)   Wt 59 kg   SpO2 98%   BMI 20.98 kg/m  Gen:   Awake, no distress   Resp:  Normal effort  MSK:   Moves extremities without difficulty  Other:    Medical Decision Making  Medically screening exam initiated at 8:55 AM.  Appropriate orders placed.  Stephanie Skinner was informed that the remainder of the evaluation will be completed by another provider, this initial triage assessment does not replace that evaluation, and the importance of remaining in the ED until their evaluation is complete.     Faythe Ghee, PA-C 02/19/23 [phone]

## 2023-03-04 ENCOUNTER — Other Ambulatory Visit: Payer: Self-pay

## 2023-03-05 ENCOUNTER — Emergency Department: Payer: Medicare HMO

## 2023-03-05 ENCOUNTER — Observation Stay (HOSPITAL_BASED_OUTPATIENT_CLINIC_OR_DEPARTMENT_OTHER)
Admit: 2023-03-05 | Discharge: 2023-03-05 | Disposition: A | Discharge status: Home / Self Care | Payer: Medicare HMO | Attending: Internal Medicine | Admitting: Internal Medicine

## 2023-03-05 ENCOUNTER — Observation Stay
Admission: EM | Admit: 2023-03-05 | Discharge: 2023-03-06 | Disposition: A | Discharge status: Home / Self Care | Payer: Medicare HMO | Attending: Internal Medicine | Admitting: Internal Medicine

## 2023-03-05 ENCOUNTER — Other Ambulatory Visit: Payer: Self-pay

## 2023-03-05 ENCOUNTER — Observation Stay: Payer: Medicare HMO

## 2023-03-05 ENCOUNTER — Encounter: Payer: Self-pay | Admitting: Emergency Medicine

## 2023-03-05 DIAGNOSIS — R7989 Other specified abnormal findings of blood chemistry: Secondary | ICD-10-CM

## 2023-03-05 DIAGNOSIS — I10 Essential (primary) hypertension: Secondary | ICD-10-CM | POA: Diagnosis present

## 2023-03-05 DIAGNOSIS — R4182 Altered mental status, unspecified: Secondary | ICD-10-CM | POA: Diagnosis not present

## 2023-03-05 DIAGNOSIS — R079 Chest pain, unspecified: Secondary | ICD-10-CM | POA: Diagnosis present

## 2023-03-05 DIAGNOSIS — F1110 Opioid abuse, uncomplicated: Secondary | ICD-10-CM | POA: Diagnosis present

## 2023-03-05 DIAGNOSIS — E440 Moderate protein-calorie malnutrition: Secondary | ICD-10-CM | POA: Diagnosis present

## 2023-03-05 DIAGNOSIS — L03115 Cellulitis of right lower limb: Secondary | ICD-10-CM | POA: Diagnosis not present

## 2023-03-05 DIAGNOSIS — R296 Repeated falls: Secondary | ICD-10-CM | POA: Diagnosis not present

## 2023-03-05 DIAGNOSIS — F17210 Nicotine dependence, cigarettes, uncomplicated: Secondary | ICD-10-CM | POA: Insufficient documentation

## 2023-03-05 DIAGNOSIS — F1310 Sedative, hypnotic or anxiolytic abuse, uncomplicated: Secondary | ICD-10-CM | POA: Diagnosis present

## 2023-03-05 DIAGNOSIS — R778 Other specified abnormalities of plasma proteins: Secondary | ICD-10-CM | POA: Diagnosis not present

## 2023-03-05 DIAGNOSIS — R0789 Other chest pain: Principal | ICD-10-CM | POA: Insufficient documentation

## 2023-03-05 DIAGNOSIS — F411 Generalized anxiety disorder: Secondary | ICD-10-CM | POA: Diagnosis present

## 2023-03-05 DIAGNOSIS — Z8616 Personal history of COVID-19: Secondary | ICD-10-CM | POA: Diagnosis not present

## 2023-03-05 DIAGNOSIS — Z79899 Other long term (current) drug therapy: Secondary | ICD-10-CM | POA: Diagnosis not present

## 2023-03-05 LAB — COMPREHENSIVE METABOLIC PANEL
ALT: 14 U/L (ref 0–44)
AST: 22 U/L (ref 15–41)
Albumin: 4.1 g/dL (ref 3.5–5.0)
Alkaline Phosphatase: 92 U/L (ref 38–126)
Anion gap: 8 (ref 5–15)
BUN: 16 mg/dL (ref 8–23)
CO2: 29 mmol/L (ref 22–32)
Calcium: 9.5 mg/dL (ref 8.9–10.3)
Chloride: 100 mmol/L (ref 98–111)
Creatinine, Ser: 0.74 mg/dL (ref 0.44–1.00)
GFR, Estimated: >60 mL/min (ref >60)
Glucose, Bld: 116 mg/dL — ABNORMAL HIGH (ref 70–99)
Potassium: 4.5 mmol/L (ref 3.5–5.1)
Sodium: 137 mmol/L (ref 135–145)
Total Bilirubin: 0.6 mg/dL (ref 0.3–1.2)
Total Protein: 8.2 g/dL — ABNORMAL HIGH (ref 6.5–8.1)

## 2023-03-05 LAB — ETHANOL: Alcohol, Ethyl (B): <10 mg/dL (ref <10)

## 2023-03-05 LAB — CBC
HCT: 42.4 % (ref 36.0–46.0)
Hemoglobin: 13.6 g/dL (ref 12.0–15.0)
MCH: 31.6 pg (ref 26.0–34.0)
MCHC: 32.1 g/dL (ref 30.0–36.0)
MCV: 98.6 fL (ref 80.0–100.0)
Platelets: 263 10*3/uL (ref 150–400)
RBC: 4.3 MIL/uL (ref 3.87–5.11)
RDW: 13 % (ref 11.5–15.5)
WBC: 6 10*3/uL (ref 4.0–10.5)
nRBC: 0 % (ref 0.0–0.2)

## 2023-03-05 LAB — URINE DRUG SCREEN, QUALITATIVE (ARMC ONLY)
Amphetamines, Ur Screen: NOT DETECTED
Barbiturates, Ur Screen: NOT DETECTED
Benzodiazepine, Ur Scrn: POSITIVE — AB
Cannabinoid 50 Ng, Ur ~~LOC~~: NOT DETECTED
Cocaine Metabolite,Ur ~~LOC~~: NOT DETECTED
MDMA (Ecstasy)Ur Screen: NOT DETECTED
Methadone Scn, Ur: NOT DETECTED
Opiate, Ur Screen: NOT DETECTED
Phencyclidine (PCP) Ur S: NOT DETECTED
Tricyclic, Ur Screen: NOT DETECTED

## 2023-03-05 LAB — LACTIC ACID, PLASMA: Lactic Acid, Venous: 0.9 mmol/L (ref 0.5–1.9)

## 2023-03-05 LAB — PROTIME-INR
INR: 1 (ref 0.8–1.2)
Prothrombin Time: 13.3 seconds (ref 11.4–15.2)

## 2023-03-05 LAB — TROPONIN I (HIGH SENSITIVITY)
Troponin I (High Sensitivity): 31 ng/L — ABNORMAL HIGH (ref <18)
Troponin I (High Sensitivity): 26 ng/L — ABNORMAL HIGH (ref <18)

## 2023-03-05 LAB — APTT: aPTT: 28 seconds (ref 24–36)

## 2023-03-05 MED ORDER — ADULT MULTIVITAMIN W/MINERALS CH
1.0000 | ORAL_TABLET | Freq: Every day | ORAL | Status: DC
  Administered 2023-03-06: 1 via ORAL
  Filled 2023-03-05: qty 1

## 2023-03-05 MED ORDER — SODIUM CHLORIDE 0.9 % IV SOLN: Freq: Once | INTRAVENOUS | Status: AC

## 2023-03-05 MED ORDER — ALPRAZOLAM 0.5 MG PO TABS: 0.5000 mg | ORAL_TABLET | Freq: Three times a day (TID) | ORAL | Status: DC | PRN

## 2023-03-05 MED ORDER — CITALOPRAM HYDROBROMIDE 20 MG PO TABS
40.0000 mg | ORAL_TABLET | Freq: Every day | ORAL | Status: DC
  Administered 2023-03-06: 40 mg via ORAL
  Filled 2023-03-05: qty 2

## 2023-03-05 MED ORDER — ALPRAZOLAM 0.5 MG PO TABS: 0.2500 mg | ORAL_TABLET | Freq: Three times a day (TID) | ORAL | Status: DC | PRN

## 2023-03-05 MED ORDER — SODIUM CHLORIDE 0.9 % IV SOLN
1.0000 g | INTRAVENOUS | Status: DC
  Administered 2023-03-05: 1 g via INTRAVENOUS
  Filled 2023-03-05: qty 10

## 2023-03-05 MED ORDER — LORAZEPAM 2 MG/ML IJ SOLN: 1.0000 mg | INTRAMUSCULAR | Status: DC | PRN

## 2023-03-05 MED ORDER — ACETAMINOPHEN 325 MG PO TABS
650.0000 mg | ORAL_TABLET | Freq: Four times a day (QID) | ORAL | Status: DC | PRN
  Administered 2023-03-06: 650 mg via ORAL
  Filled 2023-03-05: qty 2

## 2023-03-05 MED ORDER — AMLODIPINE BESYLATE 5 MG PO TABS
5.0000 mg | ORAL_TABLET | Freq: Every day | ORAL | Status: DC
  Administered 2023-03-06: 5 mg via ORAL
  Filled 2023-03-05: qty 1

## 2023-03-05 MED ORDER — THIAMINE MONONITRATE 100 MG PO TABS
100.0000 mg | ORAL_TABLET | Freq: Every day | ORAL | Status: DC
  Administered 2023-03-06: 100 mg via ORAL
  Filled 2023-03-05: qty 1

## 2023-03-05 MED ORDER — HEPARIN SODIUM (PORCINE) 5000 UNIT/ML IJ SOLN
5000.0000 [IU] | Freq: Three times a day (TID) | INTRAMUSCULAR | Status: DC
  Administered 2023-03-05: 5000 [IU] via SUBCUTANEOUS

## 2023-03-05 MED ORDER — ONDANSETRON HCL 4 MG/2ML IJ SOLN: 4.0000 mg | Freq: Four times a day (QID) | INTRAMUSCULAR | Status: DC | PRN

## 2023-03-05 MED ORDER — HYDRALAZINE HCL 20 MG/ML IJ SOLN: 5.0000 mg | Freq: Four times a day (QID) | INTRAMUSCULAR | Status: DC | PRN

## 2023-03-05 MED ORDER — THIAMINE HCL 100 MG/ML IJ SOLN
100.0000 mg | Freq: Once | INTRAMUSCULAR | Status: AC
  Administered 2023-03-05: 100 mg via INTRAVENOUS
  Filled 2023-03-05: qty 2

## 2023-03-05 MED ORDER — ALPRAZOLAM 0.25 MG PO TABS
0.2500 mg | ORAL_TABLET | Freq: Once | ORAL | Status: AC
  Administered 2023-03-06: 0.25 mg via ORAL
  Filled 2023-03-05: qty 1

## 2023-03-05 NOTE — H&P (Addendum)
History and Physical   Stephanie Skinner ZOX:[id number] DOB: 12-Jul-1956 DOA: 03/05/2023  PCP: Charlies Constable, MD  Patient coming from: home via EMS  I have personally briefly reviewed patient's old medical records in [facility] EMR.  Chief Concern: frequent falls, chest pain  HPI: Ms. Stephanie Skinner is a 67 year old female with history of COVID-19 infection in September 2023, alcohol abuse, hypertension, depression, anxiety, malnutrition to moderate degree, benzodiazepine use with likely dependence, history of acute blood loss anemia, history of multiple falls with facial fracture, history of GI bleed, who presents to the emergency department for chief concerns of chest pain, and frequent falls.  Vitals in the ED showed temperature of 97.8, respiration rate of 16, heart rate 74, blood pressure 127/69, SpO2 94% on room air.  Serum sodium is 137, potassium 4.5, chloride 100, bicarb 29, BUN of 16, serum creatinine of 0.74, EGFR greater than 60, nonfasting blood glucose 116, WBC 6.0, hemoglobin 13.6, platelets of 263.  High sensitive troponin was 31.  Lactic acid was 0.9.  EKG in the emergency department showed sinus rhythm with rate of 89, T wave inversion in leads I, 2, V2, V3.  With QTc of 511.  ED treatment: None ---------------------------------- At bedside, patient was able to tell me her name, age, current location of hospital and she knows that the current calendar year is 2024.  Patient frequently falls asleep during H&P catheter in process and her speech was difficult to understand but not in small part to her frequent sleepiness.  Patient is arousable when you call her by her first name.  She is able to follow commands weakly.  She states that after she fell, she started having chest pain.  She states that at bedside currently she is not having chest pain or shortness of breath.  She endorses right lower leg swelling and pain.  She does not know how long this has been ongoing.  She  denies dysuria, hematuria, diarrhea, thoughts of harming herself or others.  She denies known fever, chills, cough.  She endorses to daily drinking of beer, 1 to 2 cans/day (12 oz cans).  She reports that she took 2 sips of beer prior to ED presentation.  Social history: She states she lives at home with a friend.  She endorses daily tobacco use, 1 pack/day.  She endorses EtOH use of 2 cans of beer.  She denies recreational drug use.  She states she is retired.  ROS: Constitutional: no weight change, no fever ENT/Mouth: no sore throat, no rhinorrhea Eyes: no eye pain, no vision changes Cardiovascular: no chest pain, no dyspnea,  no edema, no palpitations Respiratory: no cough, no sputum, no wheezing Gastrointestinal: no nausea, no vomiting, no diarrhea, no constipation Genitourinary: no urinary incontinence, no dysuria, no hematuria Musculoskeletal: no arthralgias, no myalgias Skin: + right leg skin lesions, + right leg swelling, + right leg pain Neuro: + weakness, no loss of consciousness, no syncope Psych: no anxiety, no depression, + decrease appetite Heme/Lymph: no bruising, no bleeding  ED Course: Discussed with emergency medicine provider, patient requiring hospitalization for chief concerns of recurrent falls and elevated high sensitive troponin on initial labs.  Assessment/Plan  Principal Problem:   Chest pain Active Problems:   Essential hypertension   Generalized anxiety disorder   Benzodiazepine abuse (HCC)   Opiate abuse, continuous (HCC)   Frequent falls   Malnutrition of moderate degree   Elevated troponin   Cellulitis of right leg   Assessment and Plan:  *  Chest pain Etiology workup in progress, I suspect this is secondary to trauma in setting of falls likely secondary to multiple substance abuse including EtOH and opiates However given elevated high sensitive troponin initially with T wave changes on EKG, patient will be admitted for chest pain  observation Second high sensitive troponin ordered on admission and is in process at the time of this dictation Complete echo ordered due to T wave inversion Pending complete echo, a.m. team may consider cardiology consultation if appropriate Admit to telemetry cardiac, observation  Essential hypertension Amlodipine 5 mg daily resume for 03/06/2023 Home clonidine 0.1 mg daily and furosemide 20 mg as needed as needed not resumed on admission as patient has not taken these medications for at least 1 week Hydralazine 5 mg IV every 6 hours as needed for SBP greater than 175, 4 days ordered  Cellulitis of right leg Right distal lateral leg cellulitis, suspect secondary to skin lesion as noted in imaging in Epic media Initial lactic acid was negative in the ED, and given no fever, no indication for further lactic acid evaluation at this time Blood cultures x 2 are in process per EDP Ceftriaxone 1 g IV daily, 7 days ordered on admission Wound care consulted Admit to telemetry cardiac observation  Elevated troponin Etiology workup in progress We will check a second high sensitive troponin, if positive delta will consider heparin GGT And given new T wave inversions in leads II, V2, V3, complete echo ordered AM team to consult cardiology pending complete echo EKG as needed for recurrent chest pain ordered Admit to telemetry cardiac, observation  Malnutrition of moderate degree Protein/calorie monitor malnutrition, moderate to severe Dietitian has been consulted Thiamine 100 mg IV one-time dose ordered on admission; thiamine 100 mg daily p.o. ordered for 03/06/2023  Frequent falls Fall precautions  Opiate abuse, continuous (HCC) Alprazolam 0.5 mg p.o. 3 times daily as needed for anxiety, 20 hours of coverage ordered Aspiration precautions  Benzodiazepine abuse (HCC) UDS ordered  Generalized anxiety disorder Home Celexa 40 mg daily resumed Alprazolam 0.5 mg p.o. 3 times daily as needed  for anxiety, 19 hours of coverage ordered  Chart reviewed.   Upper endoscopy on 05/24/2022: Was read as normal esophagus, normal stomach, normal examined duodenum, no specimens collected.  DVT prophylaxis: Heparin 5000 units subcutaneous every 8 hours Code Status: Full code Diet: Heart healthy Family Communication: Attempted to call daughter, Donette Larry at [phone], no pickup Disposition Plan: Pending clinical course Consults called: none at this time Admission status: Telemetry cardiac, observation  Past Medical History:  Diagnosis Date   Anemia 05/24/2022   Arthritis    COVID-19    ETOH abuse    Hepatitis C    pt states she has been cured   Hypertension    Seizures (HCC)    Past Surgical History:  Procedure Laterality Date   ABDOMINAL HYSTERECTOMY     "partial hysterectomy"   ELBOW SURGERY     ESOPHAGOGASTRODUODENOSCOPY (EGD) WITH PROPOFOL N/A 05/24/2022   Procedure: ESOPHAGOGASTRODUODENOSCOPY (EGD) WITH PROPOFOL;  Surgeon: Toney Reil, MD;  Location: [facility] ENDOSCOPY;  Service: Gastroenterology;  Laterality: N/A;   Social History:  reports that she has been smoking cigarettes. She has been smoking an average of 1 pack per day. She has never used smokeless tobacco. She reports current alcohol use. She reports that she does not use drugs.  Allergies  Allergen Reactions   Codeine Nausea And Vomiting   Morphine And Codeine Other (See Comments)    seizures  Sulfa Antibiotics Nausea And Vomiting   Family History  Problem Relation Age of Onset   Diabetes Sister    Colon cancer Sister    Family history: Family history reviewed and not pertinent.  Prior to Admission medications   Medication Sig Start Date End Date Taking? Authorizing Provider  dextroamphetamine (DEXTROSTAT) 10 MG tablet Take 15 mg by mouth 2 (two) times daily. 02/19/23  Yes [provider]  ALPRAZolam Prudy Feeler) 1 MG tablet Take 1 mg by mouth 3 (three) times daily. 05/13/22   [provider]  amLODipine (NORVASC) 5 MG tablet Take 5 mg by mouth daily. 03/11/22   [provider]  amoxicillin (AMOXIL) 500 MG capsule Take 500 mg by mouth 3 (three) times daily. Patient not taking: Reported on 03/05/2023 05/16/22   [provider]  amphetamine-dextroamphetamine (ADDERALL XR) 10 MG 24 hr capsule Take 20 mg by mouth daily.    [provider]  buprenorphine-naloxone (SUBOXONE) 8-2 mg SUBL SL tablet Place 1 tablet under the tongue 2 (two) times daily. 05/13/22   [provider]  Cholecalciferol 50 MCG (2000 UT) CAPS Take 1 tablet by mouth daily. 01/30/21   [provider]  citalopram (CELEXA) 40 MG tablet Take 40 mg by mouth daily. 04/15/22   [provider]  clonazePAM (KLONOPIN) 0.5 MG tablet Take 1 tablet (0.5 mg total) by mouth 2 (two) times daily. 05/03/22   Enedina Finner, MD  cloNIDine (CATAPRES) 0.1 MG tablet Take 1 tablet by mouth daily. 12/18/21   [provider]  furosemide (LASIX) 20 MG tablet Take 20 mg by mouth daily as needed. 02/25/22   [provider]  Multiple Vitamin (MULTIVITAMIN WITH MINERALS) TABS tablet Take 1 tablet by mouth daily. 05/03/22   Enedina Finner, MD  thiamine (VITAMIN B-1) 100 MG tablet Take 1 tablet (100 mg total) by mouth daily. 05/03/22   Enedina Finner, MD   Physical Exam: Vitals:   03/05/23 1109 03/05/23 1110 03/05/23 1230  BP:  127/69 (!) 134/57  Pulse:  74 92  Resp:  16 (!) 21  Temp:  97.8 F (36.6 C)   TempSrc:  Oral   SpO2:  94% 92%  Weight: 58.9 kg    Height: 5\' 6"  (1.676 m)     Constitutional: appears frail, cachectic appearing, older than chronological age, frequently falls asleep Eyes: PERRL, lids and conjunctivae normal ENMT: Mucous membranes are moist. Posterior pharynx clear of any exudate or lesions. Age-appropriate dentition. Hearing appropriate Neck: normal, supple, no masses, no thyromegaly Respiratory: clear to auscultation bilaterally, no wheezing, no crackles.  Normal respiratory effort. No accessory muscle use.  Cardiovascular: Regular rate and rhythm, no murmurs / rubs / gallops. No extremity edema. 2+ pedal pulses. No carotid bruits.  Abdomen: no tenderness, no masses palpated, no hepatosplenomegaly. Bowel sounds positive.  Musculoskeletal: no clubbing / cyanosis. No joint deformity upper and lower extremities. Good ROM, no contractures, no atrophy. Normal muscle tone.  Skin: Right lower extremity skin lesion with edema and erythema         Neurologic: Sensation intact. Strength 5/5 in all 4.  Psychiatric: Normal judgment and insight. Alert and oriented x 3. Normal mood.   EKG: independently reviewed, showing sinus rhythm with rate of 89, QTc 511, new T wave inversions in leads II, V2, V3  Chest x-ray on Admission: I personally reviewed and I agree with radiologist reading as below.  US Venous Img Lower Unilateral Right (DVT)  Result Date: 03/05/2023 CLINICAL DATA:  Right leg  swelling. EXAM: RIGHT LOWER EXTREMITY VENOUS DOPPLER ULTRASOUND TECHNIQUE: Gray-scale sonography with graded compression, as well as color Doppler and duplex ultrasound were performed to evaluate the lower extremity deep venous systems from the level of the common femoral vein and including the common femoral, femoral, profunda femoral, popliteal and calf veins including the posterior tibial, peroneal and gastrocnemius veins when visible. The superficial great saphenous vein was also interrogated. Spectral Doppler was utilized to evaluate flow at rest and with distal augmentation maneuvers in the common femoral, femoral and popliteal veins. COMPARISON:  None Available. FINDINGS: Contralateral Common Femoral Vein: Respiratory phasicity is normal and symmetric with the symptomatic side. No evidence of thrombus. Normal compressibility. Common Femoral Vein: No evidence of thrombus. Normal compressibility, respiratory phasicity and response to augmentation. Saphenofemoral Junction:  No evidence of thrombus. Normal compressibility and flow on color Doppler imaging. Profunda Femoral Vein: No evidence of thrombus. Normal compressibility and flow on color Doppler imaging. Femoral Vein: No evidence of thrombus. Normal compressibility, respiratory phasicity and response to augmentation. Popliteal Vein: No evidence of thrombus. Normal compressibility, respiratory phasicity and response to augmentation. Calf Veins: Visualized right deep calf veins are patent without thrombus. Other Findings:  None. IMPRESSION: Negative for deep venous thrombosis in right lower extremity. Electronically Signed   By: Richarda Overlie M.D.   On: 03/05/2023 14:18   DG Chest Port 1 View  Result Date: 03/05/2023 CLINICAL DATA:  67 year old female with weakness.  Recurrent falls. EXAM: PORTABLE CHEST 1 VIEW COMPARISON:  Portable chest 04/30/2022 and earlier. FINDINGS: Portable AP upright view at 1205 hours. Extubated and enteric tube removed since the prior. Mildly rotated to the left. Lung volumes and mediastinal contours are within normal limits. Calcified aortic atherosclerosis. Allowing for portable technique the lungs are clear. No pneumothorax or pleural effusion. Negative visible bowel gas. Visualized tracheal air column is within normal limits. Bilateral rib fractures, some definitely chronic although displaced and possibly non healed left lateral 3rd and 4th rib fractures. IMPRESSION: 1. Mix of chronic and age indeterminate bilateral rib fractures, with mildly displaced and possibly unhealed left lateral 3rd and 4th rib fractures. 2. No acute cardiopulmonary abnormality. Electronically Signed   By: Odessa Fleming M.D.   On: 03/05/2023 12:22   CT Head Wo Contrast  Result Date: 03/05/2023 CLINICAL DATA:  67 year old female status post fall 4 days ago. And then fall from bed today. Pain and slurred speech. EXAM: CT HEAD WITHOUT CONTRAST TECHNIQUE: Contiguous axial images were obtained from the base of the skull through the  vertex without intravenous contrast. RADIATION DOSE REDUCTION: This exam was performed according to the departmental dose-optimization program which includes automated exposure control, adjustment of the mA and/or kV according to patient size and/or use of iterative reconstruction technique. COMPARISON:  Brain MRI 04/27/2022.  Head CT 05/27/2022. FINDINGS: Brain: Stable cerebral volume. No midline shift, ventriculomegaly, mass effect, evidence of mass lesion, intracranial hemorrhage or evidence of cortically based acute infarction. Gray-white differentiation is stable and within normal limits for age. Vascular: No suspicious intracranial vascular hyperdensity. Calcified atherosclerosis at the skull base. Skull: Right zygomatic arch fracture appears chronic but is new since October. Healed left maxillary and zygoma fractures seen at that time. No acute osseous abnormality identified. Sinuses/Orbits: Visualized paranasal sinuses and mastoids are clear. Other: No acute orbit or scalp soft tissue injury identified. IMPRESSION: 1. No acute intracranial abnormality or acute traumatic injury identified. 2. Healed left facial fractures, new but chronic appearing right zygoma fracture since October. Electronically Signed  By: Odessa Fleming M.D.   On: 03/05/2023 12:19    Labs on Admission: I have personally reviewed following labs  CBC: Recent Labs  Lab 03/05/23 1141  WBC 6.0  HGB 13.6  HCT 42.4  MCV 98.6  PLT 263   Basic Metabolic Panel: Recent Labs  Lab 03/05/23 1141  NA 137  K 4.5  CL 100  CO2 29  GLUCOSE 116*  BUN 16  CREATININE 0.74  CALCIUM 9.5   GFR: Estimated Creatinine Clearance: 62.6 mL/min (by C-G formula based on SCr of 0.74 mg/dL).  Liver Function Tests: Recent Labs  Lab 03/05/23 1141  AST 22  ALT 14  ALKPHOS 92  BILITOT 0.6  PROT 8.2*  ALBUMIN 4.1   Coagulation Profile: Recent Labs  Lab 03/05/23 1141  INR 1.0   Urine analysis:    Component Value Date/Time   COLORURINE  AMBER (A) 05/23/2022 1400   APPEARANCEUR HAZY (A) 05/23/2022 1400   LABSPEC 1.016 05/23/2022 1400   PHURINE 6.0 05/23/2022 1400   GLUCOSEU NEGATIVE 05/23/2022 1400   HGBUR NEGATIVE 05/23/2022 1400   BILIRUBINUR NEGATIVE 05/23/2022 1400   KETONESUR 20 (A) 05/23/2022 1400   PROTEINUR NEGATIVE 05/23/2022 1400   NITRITE NEGATIVE 05/23/2022 1400   LEUKOCYTESUR NEGATIVE 05/23/2022 1400   This document was prepared using Dragon Voice Recognition software and may include unintentional dictation errors.  Dr. Sedalia Muta [facility]  If 7PM-7AM, please contact overnight-coverage provider If 7AM-7PM, please contact day attending provider [email or web address]  03/05/2023, 2:52 PM

## 2023-03-05 NOTE — ED Notes (Signed)
Pt ambulated to the restroom with minimal help from staff. Pt was able to stay awake long enough to use the toilet in the room and ambulate back to the bed.

## 2023-03-05 NOTE — Assessment & Plan Note (Signed)
Amlodipine 5 mg daily resume for 03/06/2023 Home clonidine 0.1 mg daily and furosemide 20 mg as needed as needed not resumed on admission as patient has not taken these medications for at least 1 week Hydralazine 5 mg IV every 6 hours as needed for SBP greater than 175, 4 days ordered

## 2023-03-05 NOTE — ED Triage Notes (Signed)
Presents via EMS from home   Per EMS she had a fall 4 days ago   Had some chest pain d/t fall  Then fell out of bed again this am  Speech is slurred  And also had chest pain again  this am  Alos had redness and swelling to right lower leg with wound

## 2023-03-05 NOTE — Consult Note (Signed)
WOC Nurse Consult Note: Reason for Consult:RLE Patient admitted with AMS. History of anxiety and opoid abuse.  Larey Seat out of bed and has been with slurred speech. Unclear if wound on the RLE occurred from trauma from fall.  Wound type: intact, hematoma; right lateral leg Pressure Injury POA: NA Measurement: see nursing flow sheet at the time of the 2 nurse skin assessment/admission Wound bed: darkened area, blood accumulation under skin, but not raised  Drainage (amount, consistency, odor) none noted in images or ED notes  Periwound: mild associated ecthyma  Dressing procedure/placement/frequency: No topical care needed at this time, monitor for evolution to eschar or increasing in size. Monitor for acute signs/symptoms of infection.    Re consult if needed, will not follow at this time. Thanks  Denzil Bristol [employer], RN,CWOCN, CNS, CWON-AP [phone])

## 2023-03-05 NOTE — Assessment & Plan Note (Addendum)
Alprazolam 0.5 mg p.o. 3 times daily as needed for anxiety, 20 hours of coverage ordered Aspiration precautions

## 2023-03-05 NOTE — Assessment & Plan Note (Addendum)
Etiology workup in progress, I suspect this is secondary to trauma in setting of falls likely secondary to multiple substance abuse including EtOH and opiates However given elevated high sensitive troponin initially with T wave changes on EKG, patient will be admitted for chest pain observation Second high sensitive troponin ordered on admission and is in process at the time of this dictation Complete echo ordered due to T wave inversion Pending complete echo, a.m. team may consider cardiology consultation if appropriate Admit to telemetry cardiac, observation

## 2023-03-05 NOTE — Assessment & Plan Note (Signed)
Fall precautions.  

## 2023-03-05 NOTE — ED Provider Notes (Signed)
[facility] Provider Note    Event Date/Time   First MD Initiated Contact with Patient 03/05/23 1112     (approximate)   History   Fall and Altered Mental Status   HPI  Stephanie Skinner is a 67 y.o. female with notable history of generalized anxiety disorder, benzo and opiate abuse who presents with altered mental status.  Patient is slow to answer questions but does answer appropriately.  Denies pain or injury.  Is a poor historian.  Review of medical record demonstrates the patient was admitted under similar circumstances on May 23, 2022, eventually left AMA     Physical Exam   Triage Vital Signs: ED Triage Vitals  Enc Vitals Group     BP 03/05/23 1110 127/69     Pulse Rate 03/05/23 1110 74     Resp 03/05/23 1110 16     Temp 03/05/23 1110 97.8 F (36.6 C)     Temp Source 03/05/23 1110 Oral     SpO2 03/05/23 1110 94 %     Weight 03/05/23 1109 58.9 kg (129 lb 13.6 oz)     Height 03/05/23 1109 1.676 m (5\' 6" )     Head Circumference --      Peak Flow --      Pain Score --      Pain Loc --      Pain Edu? --      Excl. in GC? --     Most recent vital signs: Vitals:   03/05/23 1110 03/05/23 1230  BP: 127/69 (!) 134/57  Pulse: 74 92  Resp: 16 (!) 21  Temp: 97.8 F (36.6 C)   SpO2: 94% 92%     General: Awake, no distress.  Slow to answer questions but does answer appropriately CV:  Good peripheral perfusion.  Resp:  Normal effort.  Clear to auscultation bilaterally Abd:  No distention.  Other:  No pain with range of motion of upper extremities and lower extremities.  No vertebral tenderness to palpation.  No evidence of facial or head injury. Mucous membranes are dry   ED Results / Procedures / Treatments   Labs (all labs ordered are listed, but only abnormal results are displayed) Labs Reviewed  COMPREHENSIVE METABOLIC PANEL - Abnormal; Notable for the following components:      Result Value   Glucose, Bld 116 (*)    Total  Protein 8.2 (*)    All other components within normal limits  TROPONIN I (HIGH SENSITIVITY) - Abnormal; Notable for the following components:   Troponin I (High Sensitivity) 31 (*)    All other components within normal limits  CULTURE, BLOOD (ROUTINE X 2)  CULTURE, BLOOD (ROUTINE X 2)  CBC  APTT  PROTIME-INR  LACTIC ACID, PLASMA  URINE DRUG SCREEN, QUALITATIVE ([facility] ONLY)     EKG  ED ECG REPORT I, Jene Every, the attending physician, personally viewed and interpreted this ECG.  Date: 03/05/2023  Rhythm: normal sinus rhythm QRS Axis: normal Intervals: normal ST/T Wave abnormalities: T wave inversions noted Narrative Interpretation: T wave inversions  Patient denies chest pain  RADIOLOGY Chest x-ray with age-indeterminate rib fractures,     PROCEDURES:  Critical Care performed: yes  CRITICAL CARE Performed by: Jene Every   Total critical care time: 30 minutes  Critical care time was exclusive of separately billable procedures and treating other patients.  Critical care was necessary to treat or prevent imminent or life-threatening deterioration.  Critical care was time spent  personally by me on the following activities: development of treatment plan with patient and/or surrogate as well as nursing, discussions with consultants, evaluation of patient's response to treatment, examination of patient, obtaining history from patient or surrogate, ordering and performing treatments and interventions, ordering and review of laboratory studies, ordering and review of radiographic studies, pulse oximetry and re-evaluation of patient's condition.   Procedures   MEDICATIONS ORDERED IN ED: Medications  acetaminophen (TYLENOL) tablet 650 mg (has no administration in time range)  ondansetron (ZOFRAN) injection 4 mg (has no administration in time range)  heparin injection 5,000 Units (has no administration in time range)  ALPRAZolam (XANAX) tablet 0.25 mg (has no  administration in time range)  0.9 %  sodium chloride infusion ( Intravenous New Bag/Given 03/05/23 1146)     IMPRESSION / MDM / ASSESSMENT AND PLAN / ED COURSE  I reviewed the triage vital signs and the nursing notes. Patient's presentation is most consistent with acute presentation with potential threat to life or bodily function.  Patient with altered mental status, slow to respond but does answer with repeat stimulation.  Differential includes substance abuse, does not appear consistent with stroke or ICH.  Possibility of infection/metabolic encephalopathy  She denies chest pain for me although EKG does demonstrate T wave inversions  Lab work notable for normal lactic acid, normal white blood cell count.  Troponin is elevated at 31  CT head without acute abnormality.  UDS pending  Have discussed with the hospitalist for admission        FINAL CLINICAL IMPRESSION(S) / ED DIAGNOSES   Final diagnoses:  Altered mental status, unspecified altered mental status type  Elevated troponin     Rx / DC Orders   ED Discharge Orders     None        Note:  This document was prepared using Dragon voice recognition software and may include unintentional dictation errors.   Jene Every, MD 03/05/23 1314

## 2023-03-05 NOTE — Assessment & Plan Note (Addendum)
Etiology workup in progress We will check a second high sensitive troponin, if positive delta will consider heparin GGT And given new T wave inversions in leads II, V2, V3, complete echo ordered AM team to consult cardiology pending complete echo EKG as needed for recurrent chest pain ordered Admit to telemetry cardiac, observation

## 2023-03-05 NOTE — Assessment & Plan Note (Signed)
Protein/calorie monitor malnutrition, moderate to severe Dietitian has been consulted Thiamine 100 mg IV one-time dose ordered on admission; thiamine 100 mg daily p.o. ordered for 03/06/2023

## 2023-03-05 NOTE — Hospital Course (Addendum)
Ms. Stephanie Skinner is a 67 year old female with history of COVID-19 infection in September 2023, alcohol abuse, hypertension, depression, anxiety, malnutrition to moderate degree, benzodiazepine use with likely dependence, history of acute blood loss anemia, history of multiple falls with facial fracture, history of GI bleed, who presents to the emergency department for chief concerns of chest pain, and frequent falls.  Vitals in the ED showed temperature of 97.8, respiration rate of 16, heart rate 74, blood pressure 127/69, SpO2 94% on room air.  Serum sodium is 137, potassium 4.5, chloride 100, bicarb 29, BUN of 16, serum creatinine of 0.74, EGFR greater than 60, nonfasting blood glucose 116, WBC 6.0, hemoglobin 13.6, platelets of 263.  High sensitive troponin was 31.  Lactic acid was 0.9.  EKG in the emergency department showed sinus rhythm with rate of 89, T wave inversion in leads I, 2, V2, V3.  With QTc of 511.  ED treatment: None

## 2023-03-05 NOTE — Assessment & Plan Note (Addendum)
Right distal lateral leg cellulitis, suspect secondary to skin lesion as noted in imaging in Epic media Initial lactic acid was negative in the ED, and given no fever, no indication for further lactic acid evaluation at this time Blood cultures x 2 are in process per EDP Ceftriaxone 1 g IV daily, 7 days ordered on admission Wound care consulted Admit to telemetry cardiac observation

## 2023-03-05 NOTE — Assessment & Plan Note (Signed)
Home Celexa 40 mg daily resumed Alprazolam 0.5 mg p.o. 3 times daily as needed for anxiety, 19 hours of coverage ordered

## 2023-03-05 NOTE — Assessment & Plan Note (Signed)
UDS ordered

## 2023-03-06 DIAGNOSIS — F1310 Sedative, hypnotic or anxiolytic abuse, uncomplicated: Secondary | ICD-10-CM

## 2023-03-06 DIAGNOSIS — R296 Repeated falls: Secondary | ICD-10-CM | POA: Diagnosis not present

## 2023-03-06 DIAGNOSIS — R079 Chest pain, unspecified: Secondary | ICD-10-CM | POA: Diagnosis not present

## 2023-03-06 DIAGNOSIS — F411 Generalized anxiety disorder: Secondary | ICD-10-CM | POA: Diagnosis not present

## 2023-03-06 DIAGNOSIS — R0789 Other chest pain: Secondary | ICD-10-CM | POA: Diagnosis not present

## 2023-03-06 LAB — ECHOCARDIOGRAM COMPLETE
Area-P 1/2: 5.13 cm2
Height: 66 in
S' Lateral: 3.4 cm
Weight: 2077.62 [oz_av]

## 2023-03-06 LAB — BASIC METABOLIC PANEL WITH GFR
Anion gap: 9 (ref 5–15)
BUN: 16 mg/dL (ref 8–23)
CO2: 24 mmol/L (ref 22–32)
Calcium: 8.5 mg/dL — ABNORMAL LOW (ref 8.9–10.3)
Chloride: 104 mmol/L (ref 98–111)
Creatinine, Ser: 0.64 mg/dL (ref 0.44–1.00)
GFR, Estimated: >60 mL/min
Glucose, Bld: 84 mg/dL (ref 70–99)
Potassium: 3.8 mmol/L (ref 3.5–5.1)
Sodium: 137 mmol/L (ref 135–145)

## 2023-03-06 LAB — CBC
HCT: 33.6 % — ABNORMAL LOW (ref 36.0–46.0)
Hemoglobin: 11.1 g/dL — ABNORMAL LOW (ref 12.0–15.0)
MCH: 32 pg (ref 26.0–34.0)
MCHC: 33 g/dL (ref 30.0–36.0)
MCV: 96.8 fL (ref 80.0–100.0)
Platelets: 233 10*3/uL (ref 150–400)
RBC: 3.47 MIL/uL — ABNORMAL LOW (ref 3.87–5.11)
RDW: 12.8 % (ref 11.5–15.5)
WBC: 5.2 10*3/uL (ref 4.0–10.5)
nRBC: 0 % (ref 0.0–0.2)

## 2023-03-06 MED ORDER — LIDOCAINE 5 % EX PTCH
1.0000 | MEDICATED_PATCH | CUTANEOUS | Status: DC
  Administered 2023-03-06: 1 via TRANSDERMAL
  Filled 2023-03-06: qty 1

## 2023-03-06 MED ORDER — BUPRENORPHINE HCL-NALOXONE HCL 8-2 MG SL SUBL
1.0000 | SUBLINGUAL_TABLET | Freq: Every day | SUBLINGUAL | Status: DC
  Administered 2023-03-06: 1 via SUBLINGUAL
  Filled 2023-03-06: qty 1

## 2023-03-06 MED ORDER — TRAMADOL HCL 50 MG PO TABS: 50.0000 mg | ORAL_TABLET | Freq: Four times a day (QID) | ORAL | Status: DC | PRN

## 2023-03-06 MED ORDER — TRAMADOL HCL 50 MG PO TABS: 50.0000 mg | ORAL_TABLET | Freq: Three times a day (TID) | ORAL | Status: DC | PRN

## 2023-03-06 MED ORDER — ALPRAZOLAM 0.5 MG PO TABS: 1.0000 mg | ORAL_TABLET | Freq: Three times a day (TID) | ORAL | Status: DC

## 2023-03-06 NOTE — Discharge Summary (Signed)
Physician Discharge Summary   Patient: Stephanie Skinner MRN: [id number] DOB: 13-Sep-1955  Admit date:     03/05/2023  Discharge date:   Discharge Physician: Enedina Finner   PCP: Charlies Constable, MD   Recommendations at discharge:   follow-up PCP in 1 to 2 weeks  Discharge Diagnoses: Principal Problem:   Chest pain Active Problems:   Essential hypertension   Generalized anxiety disorder   Benzodiazepine abuse (HCC)   Opiate abuse, continuous (HCC)   Frequent falls   Malnutrition of moderate degree   Elevated troponin   Cellulitis of right leg Stephanie Skinner is a 67 year old female with history of COVID-19 infection in September 2023, alcohol abuse, hypertension, depression, anxiety, malnutrition to moderate degree, benzodiazepine use with likely dependence, history of acute blood loss anemia, history of multiple falls with facial fracture, history of GI bleed, who presents to the emergency department for chief concerns of chest pain, and  fall.   Generalized weakness with fall -- patient states she felt dizzy and fell and thereafter had some chest contusion. Carotid denies any chest pain. -- Overall feels better. She ate some food today. Hydrating herself. She is eager to go home.- She tells me she lives with her roommate. She was seen by physical therapy and appears stable no services needed. Ambulated in the ER with PT. -Holding clonidine for now  Hypertension -- blood pressure stable. To avoid more drop in blood pressure am holding clonidine. -- Patient recommended to keep log of blood pressure at home and discussed with PCP  Chronic pain syndrome with pain meds dependence and benzodiazepine dependence -- patient is on suboxone and Xanax -- I will not be giving her any prescription for that  Right lower extremity wound -- no evidence of cellulitis. No indication for antibiotic. -- Seen by wound nurse does not need any acute care at present continue to monitor developing  eschar  No family at bedside. Patient is eager to go home. Will discharge with outpatient follow-up PCP.       Pain control - [employer] Controlled Substance Reporting System database was reviewed. and patient was instructed, not to drive, operate heavy machinery, perform activities at heights, swimming or participation in water activities or provide baby-sitting services while on Pain, Sleep and Anxiety Medications; until their outpatient Physician has advised to do so again. Also recommended to not to take more than prescribed Pain, Sleep and Anxiety Medications.  Disposition: Home Diet recommendation:  Discharge Diet Orders (From admission, onward)     Start     Ordered   03/06/23 0000  Diet - low sodium heart healthy        03/06/23 1408           Regular diet DISCHARGE MEDICATION: Allergies as of 03/06/2023       Reactions   Codeine Nausea And Vomiting   Morphine And Codeine Other (See Comments)   seizures   Sulfa Antibiotics Nausea And Vomiting        Medication List     STOP taking these medications    cloNIDine 0.1 MG tablet Commonly known as: CATAPRES       TAKE these medications    ALPRAZolam 1 MG tablet Commonly known as: XANAX Take 1 mg by mouth 3 (three) times daily.   amLODipine 5 MG tablet Commonly known as: NORVASC Take 5 mg by mouth daily.   buprenorphine-naloxone 8-2 mg Subl SL tablet Commonly known as: SUBOXONE Place 1 tablet under the tongue 2 (two)  times daily.   Cholecalciferol 50 MCG (2000 UT) Caps Take 1 tablet by mouth daily.   citalopram 40 MG tablet Commonly known as: CELEXA Take 40 mg by mouth daily.   dextroamphetamine 10 MG tablet Commonly known as: DEXTROSTAT Take 15 mg by mouth 2 (two) times daily.   furosemide 20 MG tablet Commonly known as: LASIX Take 20 mg by mouth daily as needed.   multivitamin with minerals Tabs tablet Take 1 tablet by mouth daily.   thiamine 100 MG tablet Commonly known as:  Vitamin B-1 Take 1 tablet (100 mg total) by mouth daily.               Discharge Care Instructions  (From admission, onward)           Start     Ordered   03/06/23 0000  Discharge wound care:       Comments: 03/05/23 1444    Wound care  Every shift      Comments: Monitor RLE wound for evolution of hematoma, re-consult for formation of eschar or significant changes in hematoma. No need for topical wound care at this time.  03/05/23 1444   03/06/23 1408            Follow-up Information     Charlies Constable, MD. Schedule an appointment as soon as possible for a visit in 1 week(s).   Specialty: Family Medicine                Filed Weights   03/05/23 1109  Weight: 58.9 kg     Condition at discharge: fair  The results of significant diagnostics from this hospitalization (including imaging, microbiology, ancillary and laboratory) are listed below for reference.   Imaging Studies: ECHOCARDIOGRAM COMPLETE  Result Date: 03/06/2023    ECHOCARDIOGRAM REPORT   Patient Name:   Stephanie Skinner Date of Exam: 03/05/2023 Medical Rec #:  [id number]      Height:       66.0 in Accession #:    [id number]     Weight:       129.9 lb Date of Birth:  [birth year]       BSA:          1.664 m Patient Age:    67 years       BP:           127/74 mmHg Patient Gender: F              HR:           90 bpm. Exam Location:  [facility] Procedure: 2D Echo, Cardiac Doppler and Color Doppler Indications:     Elevated Troponin  History:         Patient has no prior history of Echocardiogram examinations.                  Risk Factors:Hypertension.  Sonographer:     Daphine Deutscher RDCS Referring Phys:  [id number] AMY N COX Diagnosing Phys: Julien Nordmann MD IMPRESSIONS  1. Left ventricular ejection fraction, by estimation, is 60 to 65%. The left ventricle has normal function. The left ventricle has no regional wall motion abnormalities. Left ventricular diastolic parameters are consistent with Grade I  diastolic dysfunction (impaired relaxation).  2. Right ventricular systolic function is normal. The right ventricular size is normal. Tricuspid regurgitation signal is inadequate for assessing PA pressure.  3. A small pericardial effusion is present.  4. The mitral valve is normal in structure. Mild to  moderate mitral valve regurgitation. No evidence of mitral stenosis.  5. The aortic valve has an indeterminant number of cusps. Aortic valve regurgitation is not visualized. No aortic stenosis is present.  6. The inferior vena cava is normal in size with greater than 50% respiratory variability, suggesting right atrial pressure of 3 mmHg. FINDINGS  Left Ventricle: Left ventricular ejection fraction, by estimation, is 60 to 65%. The left ventricle has normal function. The left ventricle has no regional wall motion abnormalities. The left ventricular internal cavity size was normal in size. There is  no left ventricular hypertrophy. Left ventricular diastolic parameters are consistent with Grade I diastolic dysfunction (impaired relaxation). Right Ventricle: The right ventricular size is normal. No increase in right ventricular wall thickness. Right ventricular systolic function is normal. Tricuspid regurgitation signal is inadequate for assessing PA pressure. Left Atrium: Left atrial size was normal in size. Right Atrium: Right atrial size was normal in size. Pericardium: A small pericardial effusion is present. Mitral Valve: The mitral valve is normal in structure. Mild to moderate mitral valve regurgitation. No evidence of mitral valve stenosis. Tricuspid Valve: The tricuspid valve is normal in structure. Tricuspid valve regurgitation is mild . No evidence of tricuspid stenosis. Aortic Valve: The aortic valve has an indeterminant number of cusps. Aortic valve regurgitation is not visualized. No aortic stenosis is present. Pulmonic Valve: The pulmonic valve was normal in structure. Pulmonic valve regurgitation is not  visualized. No evidence of pulmonic stenosis. Aorta: The aortic root is normal in size and structure. Venous: The inferior vena cava is normal in size with greater than 50% respiratory variability, suggesting right atrial pressure of 3 mmHg. IAS/Shunts: No atrial level shunt detected by color flow Doppler.  LEFT VENTRICLE PLAX 2D LVIDd:         4.60 cm   Diastology LVIDs:         3.40 cm   LV e' medial:    5.72 cm/s LV PW:         1.00 cm   LV E/e' medial:  13.5 LV IVS:        1.00 cm   LV e' lateral:   12.45 cm/s LVOT diam:     2.00 cm   LV E/e' lateral: 6.2 LV SV:         49 LV SV Index:   30 LVOT Area:     3.14 cm  RIGHT VENTRICLE             IVC RV Basal diam:  2.80 cm     IVC diam: 1.50 cm RV S prime:     14.37 cm/s TAPSE (M-mode): 1.6 cm LEFT ATRIUM             Index        RIGHT ATRIUM           Index LA diam:        4.50 cm 2.70 cm/m   RA Area:     10.90 cm LA Vol (A2C):   43.8 ml 26.32 ml/m  RA Volume:   25.50 ml  15.32 ml/m LA Vol (A4C):   36.6 ml 21.99 ml/m LA Biplane Vol: 39.9 ml 23.97 ml/m  AORTIC VALVE LVOT Vmax:   85.15 cm/s LVOT Vmean:  55.300 cm/s LVOT VTI:    0.156 m  AORTA Ao Root diam: 2.80 cm MITRAL VALVE MV Area (PHT): 5.13 cm    SHUNTS MV Decel Time: 148 msec    Systemic VTI:  0.16 m  MV E velocity: 77.05 cm/s  Systemic Diam: 2.00 cm MV A velocity: 92.75 cm/s MV E/A ratio:  0.83 Julien Nordmann MD Electronically signed by Julien Nordmann MD Signature Date/Time: 03/06/2023/8:06:09 AM    Final    US Venous Img Lower Unilateral Right (DVT)  Result Date: 03/05/2023 CLINICAL DATA:  Right leg swelling. EXAM: RIGHT LOWER EXTREMITY VENOUS DOPPLER ULTRASOUND TECHNIQUE: Gray-scale sonography with graded compression, as well as color Doppler and duplex ultrasound were performed to evaluate the lower extremity deep venous systems from the level of the common femoral vein and including the common femoral, femoral, profunda femoral, popliteal and calf veins including the posterior tibial, peroneal  and gastrocnemius veins when visible. The superficial great saphenous vein was also interrogated. Spectral Doppler was utilized to evaluate flow at rest and with distal augmentation maneuvers in the common femoral, femoral and popliteal veins. COMPARISON:  None Available. FINDINGS: Contralateral Common Femoral Vein: Respiratory phasicity is normal and symmetric with the symptomatic side. No evidence of thrombus. Normal compressibility. Common Femoral Vein: No evidence of thrombus. Normal compressibility, respiratory phasicity and response to augmentation. Saphenofemoral Junction: No evidence of thrombus. Normal compressibility and flow on color Doppler imaging. Profunda Femoral Vein: No evidence of thrombus. Normal compressibility and flow on color Doppler imaging. Femoral Vein: No evidence of thrombus. Normal compressibility, respiratory phasicity and response to augmentation. Popliteal Vein: No evidence of thrombus. Normal compressibility, respiratory phasicity and response to augmentation. Calf Veins: Visualized right deep calf veins are patent without thrombus. Other Findings:  None. IMPRESSION: Negative for deep venous thrombosis in right lower extremity. Electronically Signed   By: Richarda Overlie M.D.   On: 03/05/2023 14:18   DG Chest Port 1 View  Result Date: 03/05/2023 CLINICAL DATA:  67 year old female with weakness.  Recurrent falls. EXAM: PORTABLE CHEST 1 VIEW COMPARISON:  Portable chest 04/30/2022 and earlier. FINDINGS: Portable AP upright view at 1205 hours. Extubated and enteric tube removed since the prior. Mildly rotated to the left. Lung volumes and mediastinal contours are within normal limits. Calcified aortic atherosclerosis. Allowing for portable technique the lungs are clear. No pneumothorax or pleural effusion. Negative visible bowel gas. Visualized tracheal air column is within normal limits. Bilateral rib fractures, some definitely chronic although displaced and possibly non healed left  lateral 3rd and 4th rib fractures. IMPRESSION: 1. Mix of chronic and age indeterminate bilateral rib fractures, with mildly displaced and possibly unhealed left lateral 3rd and 4th rib fractures. 2. No acute cardiopulmonary abnormality. Electronically Signed   By: Odessa Fleming M.D.   On: 03/05/2023 12:22   CT Head Wo Contrast  Result Date: 03/05/2023 CLINICAL DATA:  67 year old female status post fall 4 days ago. And then fall from bed today. Pain and slurred speech. EXAM: CT HEAD WITHOUT CONTRAST TECHNIQUE: Contiguous axial images were obtained from the base of the skull through the vertex without intravenous contrast. RADIATION DOSE REDUCTION: This exam was performed according to the departmental dose-optimization program which includes automated exposure control, adjustment of the mA and/or kV according to patient size and/or use of iterative reconstruction technique. COMPARISON:  Brain MRI 04/27/2022.  Head CT 05/27/2022. FINDINGS: Brain: Stable cerebral volume. No midline shift, ventriculomegaly, mass effect, evidence of mass lesion, intracranial hemorrhage or evidence of cortically based acute infarction. Gray-white differentiation is stable and within normal limits for age. Vascular: No suspicious intracranial vascular hyperdensity. Calcified atherosclerosis at the skull base. Skull: Right zygomatic arch fracture appears chronic but is new since October. Healed left maxillary and zygoma fractures  seen at that time. No acute osseous abnormality identified. Sinuses/Orbits: Visualized paranasal sinuses and mastoids are clear. Other: No acute orbit or scalp soft tissue injury identified. IMPRESSION: 1. No acute intracranial abnormality or acute traumatic injury identified. 2. Healed left facial fractures, new but chronic appearing right zygoma fracture since October. Electronically Signed   By: Odessa Fleming M.D.   On: 03/05/2023 12:19    Microbiology: Results for orders placed or performed during the hospital  encounter of 03/05/23  Blood culture (routine x 2)     Status: None (Preliminary result)   Collection Time: 03/05/23 11:37 AM   Specimen: BLOOD  Result Value Ref Range Status   Specimen Description BLOOD BLOOD RIGHT ARM  Final   Special Requests   Final    BOTTLES DRAWN AEROBIC AND ANAEROBIC Blood Culture adequate volume   Culture   Final    NO GROWTH < 24 HOURS Performed at [facility], [address] [place], [place] [address]    Report Status PENDING  Incomplete  Blood culture (routine x 2)     Status: None (Preliminary result)   Collection Time: 03/05/23 11:37 AM   Specimen: BLOOD  Result Value Ref Range Status   Specimen Description BLOOD BLOOD LEFT FOREARM  Final   Special Requests   Final    BOTTLES DRAWN AEROBIC AND ANAEROBIC Blood Culture adequate volume   Culture   Final    NO GROWTH < 24 HOURS Performed at [facility], [address], [place], [place] [address]    Report Status PENDING  Incomplete    Labs: CBC: Recent Labs  Lab 03/05/23 1141 03/06/23 0422  WBC 6.0 5.2  HGB 13.6 11.1*  HCT 42.4 33.6*  MCV 98.6 96.8  PLT 263 233   Basic Metabolic Panel: Recent Labs  Lab 03/05/23 1141 03/06/23 0422  NA 137 137  K 4.5 3.8  CL 100 104  CO2 29 24  GLUCOSE 116* 84  BUN 16 16  CREATININE 0.74 0.64  CALCIUM 9.5 8.5*   Liver Function Tests: Recent Labs  Lab 03/05/23 1141  AST 22  ALT 14  ALKPHOS 92  BILITOT 0.6  PROT 8.2*  ALBUMIN 4.1   CBG: No results for input(s): "GLUCAP" in the last 168 hours.  Discharge time spent: greater than 30 minutes.  Signed: Enedina Finner, MD [facility] 03/06/2023

## 2023-03-06 NOTE — Progress Notes (Signed)
TOC consult for Home Health, PT has signed off indicating no PT follow up. Patient has PCP Dr. Charlies Constable, per MD no indication of home services needed..   Please consult TOC should  needs arise.   Darolyn Rua, Diaz, MSW, [place] [phone]

## 2023-03-06 NOTE — Evaluation (Addendum)
Physical Therapy Evaluation Patient Details Name: Stephanie Skinner MRN: [id number] DOB: Aug 03, 1956 Today's Date: 03/06/2023  History of Present Illness  Stephanie Skinner is a 67yoF who comes to [facility] after a fall at home, impact to left upper lateral chest wall, room mate called EMS due to concerns over chest pain s/p fall. Per notes, workup more consistent with musculoskeletal pain v cardiac CP.  Clinical Impression  Pt in bed on arrival, still in ED c-pod. Pt agreeable to evaluation, albeit she attests to being at baseline, no acute deficits save for left lateral CP which she attributes to impact sustain on night stand upon falling. Pt reports to be at her baseline, demonstrates independent mobility in room, reassuring balance screening. Pt would desperately like to leave, is mad her roommate called EMS. Sounds as though pt has had decreased frequency of falls since prior PT evaluation here. Will signoff at this time. No PT services needed at this time.       Assistance Recommended at Discharge None  If plan is discharge home, recommend the following:  Can travel by private vehicle  Assist for transportation        Equipment Recommendations None recommended by PT  Recommendations for Other Services       Functional Status Assessment Patient has had a recent decline in their functional status and demonstrates the ability to make significant improvements in function in a reasonable and predictable amount of time.     Precautions / Restrictions Precautions Precautions: Fall Restrictions Weight Bearing Restrictions: No      Mobility  Bed Mobility Overal bed mobility: Independent                  Transfers Overall transfer level: Independent                      Ambulation/Gait Ambulation/Gait assistance: Independent Gait Distance (Feet): 80 Feet (limited by lack of clothing available while in ED, AMB just fine and at baseline per pt report)               Stairs            Wheelchair Mobility     Tilt Bed    Modified Rankin (Stroke Patients Only)       Balance                                             Pertinent Vitals/Pain Pain Assessment Pain Assessment: No/denies pain    Home Living Family/patient expects to be discharged to:: Private residence Living Arrangements: Non-relatives/Friends Stephanie Skinner) Available Help at Discharge: Friend(s);Available PRN/intermittently Type of Home: House Home Access: Stairs to enter Entrance Stairs-Rails: Right Entrance Stairs-Number of Steps: 1   Home Layout: One level Home Equipment: None      Prior Function Prior Level of Function : Independent/Modified Independent;History of Falls (last six months)             Mobility Comments: has been accessing community on foot recent weeks due to no longer having a working vehicle. no device used/needed       Hand Dominance        Extremity/Trunk Assessment                Communication   Communication: No difficulties  Cognition Arousal/Alertness: Awake/alert Behavior During Therapy: WFL for tasks assessed/performed Overall Cognitive Status:  Within Functional Limits for tasks assessed                                          General Comments      Exercises Other Exercises Other Exercises: high marching in place x10, no LOB Other Exercises: normal stance eyes closed x15sec, no LOB, low sway Other Exercises: horizontal head turns in normal stance x5 bilat   Assessment/Plan    PT Assessment Patient does not need any further PT services  PT Problem List Decreased safety awareness       PT Treatment Interventions Therapeutic activities;Neuromuscular re-education    PT Goals (Current goals can be found in the Care Plan section)  Acute Rehab PT Goals PT Goal Formulation: All assessment and education complete, DC therapy    Frequency       Co-evaluation                AM-PAC PT "6 Clicks" Mobility  Outcome Measure Help needed turning from your back to your side while in a flat bed without using bedrails?: None Help needed moving from lying on your back to sitting on the side of a flat bed without using bedrails?: None Help needed moving to and from a bed to a chair (including a wheelchair)?: None Help needed standing up from a chair using your arms (e.g., wheelchair or bedside chair)?: None Help needed to walk in hospital room?: None Help needed climbing 3-5 steps with a railing? : None 6 Click Score: 24    End of Session   Activity Tolerance: Patient tolerated treatment well;No increased pain Patient left: in bed;with call bell/phone within reach Nurse Communication: Mobility status PT Visit Diagnosis: History of falling (Z91.81)    Time: 1610-9604 PT Time Calculation (min) (ACUTE ONLY): 19 min   Charges:   PT Evaluation $PT Eval Low Complexity: 1 Low   PT General Charges $$ ACUTE PT VISIT: 1 Visit        2:25 PM, 03/06/23 Rosamaria Lints, PT, DPT Physical Therapist - [facility]  [phone] (ASCOM)    Lauralynn Loeb C 03/06/2023, 2:20 PM

## 2023-03-10 LAB — CULTURE, BLOOD (ROUTINE X 2)
Culture: NO GROWTH
Culture: NO GROWTH
Special Requests: ADEQUATE
Special Requests: ADEQUATE

## 2023-04-26 ENCOUNTER — Other Ambulatory Visit: Payer: Self-pay

## 2023-04-26 ENCOUNTER — Encounter: Payer: Self-pay | Admitting: Emergency Medicine

## 2023-04-26 ENCOUNTER — Emergency Department
Admission: EM | Admit: 2023-04-26 | Discharge: 2023-04-26 | Disposition: A | Discharge status: Home / Self Care | Payer: Medicare HMO | Attending: Emergency Medicine | Admitting: Emergency Medicine

## 2023-04-26 DIAGNOSIS — F13239 Sedative, hypnotic or anxiolytic dependence with withdrawal, unspecified: Secondary | ICD-10-CM | POA: Diagnosis not present

## 2023-04-26 DIAGNOSIS — I10 Essential (primary) hypertension: Secondary | ICD-10-CM | POA: Insufficient documentation

## 2023-04-26 DIAGNOSIS — F13930 Sedative, hypnotic or anxiolytic use, unspecified with withdrawal, uncomplicated: Secondary | ICD-10-CM

## 2023-04-26 MED ORDER — ONDANSETRON 4 MG PO TBDP
ORAL_TABLET | ORAL | Status: AC
  Filled 2023-04-26: qty 1

## 2023-04-26 MED ORDER — ONDANSETRON 8 MG PO TBDP
8.0000 mg | ORAL_TABLET | Freq: Once | ORAL | Status: AC
  Administered 2023-04-26: 4 mg via ORAL

## 2023-04-26 MED ORDER — DIAZEPAM 10 MG PO TABS: 10.0000 mg | ORAL_TABLET | Freq: Two times a day (BID) | ORAL | 0 refills | Status: AC | PRN

## 2023-04-26 MED ORDER — DIAZEPAM 5 MG PO TABS
10.0000 mg | ORAL_TABLET | Freq: Once | ORAL | Status: AC
  Administered 2023-04-26: 10 mg via ORAL
  Filled 2023-04-26: qty 2

## 2023-04-26 NOTE — ED Provider Notes (Signed)
[facility] Provider Note    Event Date/Time   First MD Initiated Contact with Patient 04/26/23 1119     (approximate)   History   Chief Complaint: Withdrawal   HPI  Annaclaire NALIJAH NASTRI is a 67 y.o. female with a history of hypertension, hepatitis C, alcohol abuse, anxiety who comes to the ED complaining of feeling anxious and restless.  She ran out of her Xanax which she takes 1 mg 3 times a day, waiting for it to be refilled by mail order pharmacy.  PDMP shows it was filled 3 days ago, but today is Saturday of Labor Day holiday weekend.  Unlikely to arrive before Tuesday.  Patient denies any other acute complaints, no chest pain or shortness of breath or abdominal pain.  She feels nauseated but no vomiting diarrhea or fever.     Physical Exam   Triage Vital Signs: ED Triage Vitals  Encounter Vitals Group     BP 04/26/23 1042 (!) 178/91     Systolic BP Percentile --      Diastolic BP Percentile --      Pulse Rate 04/26/23 1042 85     Resp 04/26/23 1042 15     Temp 04/26/23 1042 98.4 F (36.9 C)     Temp Source 04/26/23 1042 Oral     SpO2 04/26/23 1042 98 %     Weight 04/26/23 1053 120 lb (54.4 kg)     Height 04/26/23 1053 5\' 6"  (1.676 m)     Head Circumference --      Peak Flow --      Pain Score 04/26/23 1052 8     Pain Loc --      Pain Education --      Exclude from Growth Chart --     Most recent vital signs: Vitals:   04/26/23 1042  BP: (!) 178/91  Pulse: 85  Resp: 15  Temp: 98.4 F (36.9 C)  SpO2: 98%    General: Awake, no distress.  CV:  Good peripheral perfusion.  Regular rate and rhythm Resp:  Normal effort.  Clear to auscultation bilaterally Abd:  No distention.  Soft nontender Other:  Moist oral mucosa.   ED Results / Procedures / Treatments   Labs (all labs ordered are listed, but only abnormal results are displayed) Labs Reviewed - No data to  display   EKG    RADIOLOGY    PROCEDURES:  Procedures   MEDICATIONS ORDERED IN ED: Medications  ondansetron (ZOFRAN-ODT) 4 MG disintegrating tablet (  Not Given 04/26/23 1144)  diazepam (VALIUM) tablet 10 mg (10 mg Oral Given 04/26/23 1142)  ondansetron (ZOFRAN-ODT) disintegrating tablet 8 mg (4 mg Oral Given 04/26/23 1144)     IMPRESSION / MDM / ASSESSMENT AND PLAN / ED COURSE  I reviewed the triage vital signs and the nursing notes.    Patient's presentation is most consistent with exacerbation of chronic illness.  Patient presents with symptoms of benzodiazepine withdrawal.  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  Will give Valium now, prescribed a limited supply to get her through the weekend until her regular prescription could be reliably delivered to avoid a severe withdrawal syndrome.  She otherwise is nontoxic with reassuring exam and stable for discharge.       FINAL CLINICAL IMPRESSION(S) / ED DIAGNOSES   Final diagnoses:  Benzodiazepine withdrawal without complication (HCC)     Rx / DC Orders   ED Discharge Orders  Ordered    diazepam (VALIUM) 10 MG tablet  Every 12 hours PRN        04/26/23 1141             Note:  This document was prepared using Dragon voice recognition software and may include unintentional dictation errors.   Sharman Cheek, MD 04/26/23 [phone]

## 2023-04-26 NOTE — ED Triage Notes (Addendum)
Pt in via [facility], reports she has been out of her Xanax since Thursday and is concerned about going through withdrawals; states she has had seizures in the past.  States [place] had said that her medications should deliver to her by yesterday but they have still not arrived.    Complains of being clammy, diarrhea, tingling to bilateral hands.  States, "I am super panicky, and paranoid."  Ambulatory to triage; NAD noted at this time.

## 2023-04-26 NOTE — ED Notes (Signed)
First Nurse Note: Pt to ED via [facility] from home for anxiety. Pt states that she has been out of her xanax for 2 days. Pt was A & [facility] with EMS.

## 2023-07-13 ENCOUNTER — Emergency Department: Payer: Medicare HMO

## 2023-07-13 ENCOUNTER — Inpatient Hospital Stay
Admission: EM | Admit: 2023-07-13 | Discharge: 2023-07-15 | DRG: 917 | Disposition: A | Discharge status: Other Acute Inpatient Hospital | Payer: Medicare HMO | Attending: Internal Medicine | Admitting: Internal Medicine

## 2023-07-13 ENCOUNTER — Encounter: Payer: Self-pay | Admitting: Internal Medicine

## 2023-07-13 ENCOUNTER — Other Ambulatory Visit: Payer: Self-pay

## 2023-07-13 ENCOUNTER — Inpatient Hospital Stay: Payer: Medicare HMO

## 2023-07-13 DIAGNOSIS — I10 Essential (primary) hypertension: Secondary | ICD-10-CM | POA: Diagnosis present

## 2023-07-13 DIAGNOSIS — F17290 Nicotine dependence, other tobacco product, uncomplicated: Secondary | ICD-10-CM | POA: Diagnosis present

## 2023-07-13 DIAGNOSIS — Z833 Family history of diabetes mellitus: Secondary | ICD-10-CM | POA: Diagnosis not present

## 2023-07-13 DIAGNOSIS — T481X2A Poisoning by skeletal muscle relaxants [neuromuscular blocking agents], intentional self-harm, initial encounter: Principal | ICD-10-CM | POA: Diagnosis present

## 2023-07-13 DIAGNOSIS — F17210 Nicotine dependence, cigarettes, uncomplicated: Secondary | ICD-10-CM | POA: Diagnosis present

## 2023-07-13 DIAGNOSIS — F1320 Sedative, hypnotic or anxiolytic dependence, uncomplicated: Secondary | ICD-10-CM | POA: Diagnosis present

## 2023-07-13 DIAGNOSIS — G9341 Metabolic encephalopathy: Secondary | ICD-10-CM | POA: Diagnosis present

## 2023-07-13 DIAGNOSIS — F332 Major depressive disorder, recurrent severe without psychotic features: Secondary | ICD-10-CM | POA: Diagnosis present

## 2023-07-13 DIAGNOSIS — F1120 Opioid dependence, uncomplicated: Secondary | ICD-10-CM | POA: Diagnosis present

## 2023-07-13 DIAGNOSIS — F1020 Alcohol dependence, uncomplicated: Secondary | ICD-10-CM | POA: Diagnosis present

## 2023-07-13 DIAGNOSIS — Z79899 Other long term (current) drug therapy: Secondary | ICD-10-CM

## 2023-07-13 DIAGNOSIS — Z885 Allergy status to narcotic agent status: Secondary | ICD-10-CM | POA: Diagnosis not present

## 2023-07-13 DIAGNOSIS — T424X1A Poisoning by benzodiazepines, accidental (unintentional), initial encounter: Secondary | ICD-10-CM | POA: Diagnosis present

## 2023-07-13 DIAGNOSIS — T50901A Poisoning by unspecified drugs, medicaments and biological substances, accidental (unintentional), initial encounter: Secondary | ICD-10-CM | POA: Diagnosis present

## 2023-07-13 DIAGNOSIS — R4182 Altered mental status, unspecified: Secondary | ICD-10-CM | POA: Diagnosis present

## 2023-07-13 DIAGNOSIS — Z8616 Personal history of COVID-19: Secondary | ICD-10-CM | POA: Diagnosis not present

## 2023-07-13 DIAGNOSIS — Z882 Allergy status to sulfonamides status: Secondary | ICD-10-CM | POA: Diagnosis not present

## 2023-07-13 DIAGNOSIS — Z800 Family history of malignant neoplasm of digestive organs: Secondary | ICD-10-CM | POA: Diagnosis not present

## 2023-07-13 DIAGNOSIS — T68XXXA Hypothermia, initial encounter: Secondary | ICD-10-CM | POA: Diagnosis present

## 2023-07-13 DIAGNOSIS — T50902A Poisoning by unspecified drugs, medicaments and biological substances, intentional self-harm, initial encounter: Secondary | ICD-10-CM | POA: Diagnosis not present

## 2023-07-13 DIAGNOSIS — Z90711 Acquired absence of uterus with remaining cervical stump: Secondary | ICD-10-CM | POA: Diagnosis not present

## 2023-07-13 DIAGNOSIS — F13239 Sedative, hypnotic or anxiolytic dependence with withdrawal, unspecified: Secondary | ICD-10-CM | POA: Diagnosis present

## 2023-07-13 DIAGNOSIS — F13939 Sedative, hypnotic or anxiolytic use, unspecified with withdrawal, unspecified: Secondary | ICD-10-CM | POA: Insufficient documentation

## 2023-07-13 LAB — CBC
HCT: 37.4 % (ref 36.0–46.0)
Hemoglobin: 12.4 g/dL (ref 12.0–15.0)
MCH: 31.8 pg (ref 26.0–34.0)
MCHC: 33.2 g/dL (ref 30.0–36.0)
MCV: 95.9 fL (ref 80.0–100.0)
Platelets: 222 10*3/uL (ref 150–400)
RBC: 3.9 MIL/uL (ref 3.87–5.11)
RDW: 13.2 % (ref 11.5–15.5)
WBC: 7.3 10*3/uL (ref 4.0–10.5)
nRBC: 0 % (ref 0.0–0.2)

## 2023-07-13 LAB — COMPREHENSIVE METABOLIC PANEL
ALT: 25 U/L (ref 0–44)
AST: 29 U/L (ref 15–41)
Albumin: 3.8 g/dL (ref 3.5–5.0)
Alkaline Phosphatase: 95 U/L (ref 38–126)
Anion gap: 9 (ref 5–15)
BUN: 12 mg/dL (ref 8–23)
CO2: 25 mmol/L (ref 22–32)
Calcium: 9.1 mg/dL (ref 8.9–10.3)
Chloride: 102 mmol/L (ref 98–111)
Creatinine, Ser: 0.65 mg/dL (ref 0.44–1.00)
GFR, Estimated: >60 mL/min (ref >60)
Glucose, Bld: 123 mg/dL — ABNORMAL HIGH (ref 70–99)
Potassium: 4.6 mmol/L (ref 3.5–5.1)
Sodium: 136 mmol/L (ref 135–145)
Total Bilirubin: 0.7 mg/dL (ref <1.2)
Total Protein: 7.3 g/dL (ref 6.5–8.1)

## 2023-07-13 LAB — ETHANOL: Alcohol, Ethyl (B): <10 mg/dL (ref <10)

## 2023-07-13 LAB — SALICYLATE LEVEL: Salicylate Lvl: <7 mg/dL — ABNORMAL LOW (ref 7.0–30.0)

## 2023-07-13 LAB — LACTIC ACID, PLASMA
Lactic Acid, Venous: 0.7 mmol/L (ref 0.5–1.9)
Lactic Acid, Venous: 0.7 mmol/L (ref 0.5–1.9)

## 2023-07-13 LAB — ACETAMINOPHEN LEVEL: Acetaminophen (Tylenol), Serum: <10 ug/mL — ABNORMAL LOW (ref 10–30)

## 2023-07-13 MED ORDER — ONDANSETRON HCL 4 MG PO TABS: 4.0000 mg | ORAL_TABLET | Freq: Four times a day (QID) | ORAL | Status: DC | PRN

## 2023-07-13 MED ORDER — SODIUM CHLORIDE 0.9 % IV SOLN
1.0000 g | Freq: Once | INTRAVENOUS | Status: AC
  Administered 2023-07-13: 1 g via INTRAVENOUS
  Filled 2023-07-13: qty 10

## 2023-07-13 MED ORDER — HYDRALAZINE HCL 20 MG/ML IJ SOLN: 5.0000 mg | Freq: Four times a day (QID) | INTRAMUSCULAR | Status: DC | PRN

## 2023-07-13 MED ORDER — SODIUM CHLORIDE 0.9 % IV SOLN: Freq: Once | INTRAVENOUS | Status: AC

## 2023-07-13 MED ORDER — DEXTROSE 5 % IV SOLN
500.0000 mg | Freq: Once | INTRAVENOUS | Status: AC
  Administered 2023-07-13: 500 mg via INTRAVENOUS
  Filled 2023-07-13: qty 5

## 2023-07-13 MED ORDER — LORAZEPAM 1 MG PO TABS
1.0000 mg | ORAL_TABLET | ORAL | Status: DC | PRN
  Administered 2023-07-14 – 2023-07-15 (×2): 1 mg via ORAL
  Filled 2023-07-13 (×2): qty 1

## 2023-07-13 MED ORDER — ONDANSETRON HCL 4 MG/2ML IJ SOLN: 4.0000 mg | Freq: Four times a day (QID) | INTRAMUSCULAR | Status: DC | PRN

## 2023-07-13 MED ORDER — FOLIC ACID 1 MG PO TABS
1.0000 mg | ORAL_TABLET | Freq: Every day | ORAL | Status: DC
  Administered 2023-07-14 – 2023-07-15 (×2): 1 mg via ORAL
  Filled 2023-07-13 (×2): qty 1

## 2023-07-13 MED ORDER — THIAMINE HCL 100 MG/ML IJ SOLN
100.0000 mg | Freq: Every day | INTRAMUSCULAR | Status: DC
  Administered 2023-07-13: 100 mg via INTRAVENOUS
  Filled 2023-07-13: qty 2

## 2023-07-13 MED ORDER — ACETAMINOPHEN 325 MG PO TABS
650.0000 mg | ORAL_TABLET | Freq: Four times a day (QID) | ORAL | Status: DC | PRN
  Administered 2023-07-14: 650 mg via ORAL
  Filled 2023-07-13: qty 2

## 2023-07-13 MED ORDER — ACETAMINOPHEN 650 MG RE SUPP: 650.0000 mg | Freq: Four times a day (QID) | RECTAL | Status: DC | PRN

## 2023-07-13 MED ORDER — ENOXAPARIN SODIUM 40 MG/0.4ML IJ SOSY
40.0000 mg | PREFILLED_SYRINGE | INTRAMUSCULAR | Status: DC
  Administered 2023-07-13 – 2023-07-14 (×2): 40 mg via SUBCUTANEOUS
  Filled 2023-07-13 (×2): qty 0.4

## 2023-07-13 MED ORDER — LORAZEPAM 2 MG/ML IJ SOLN
1.0000 mg | INTRAMUSCULAR | Status: DC | PRN
  Administered 2023-07-14: 2 mg via INTRAVENOUS
  Filled 2023-07-13 (×2): qty 1

## 2023-07-13 MED ORDER — THIAMINE MONONITRATE 100 MG PO TABS
100.0000 mg | ORAL_TABLET | Freq: Every day | ORAL | Status: DC
Start: 2023-07-13 — End: 2023-07-15
  Administered 2023-07-14 – 2023-07-15 (×2): 100 mg via ORAL
  Filled 2023-07-13 (×2): qty 1

## 2023-07-13 MED ORDER — ADULT MULTIVITAMIN W/MINERALS CH
1.0000 | ORAL_TABLET | Freq: Every day | ORAL | Status: DC
  Administered 2023-07-14 – 2023-07-15 (×2): 1 via ORAL
  Filled 2023-07-13 (×2): qty 1

## 2023-07-13 NOTE — ED Notes (Addendum)
Bher hugger removed as core temp is 98.76f. Pt and linens changed, warm blankets given.

## 2023-07-13 NOTE — H&P (Addendum)
History and Physical    Stephanie Skinner OZH:[id number] DOB: 04-Apr-1956 DOA: 07/13/2023  PCP: Charlies Constable, MD (Confirm with patient/family/NH records and if not entered, this has to be entered at [facility] point of entry) Patient coming from: Home   I have personally briefly reviewed patient's old medical records in [facility]  Chief Complaint: AMS  HPI: Stephanie Skinner is a 67 y.o. female with medical history significant of benzodiazepine dependence, alcohol dependence, narcotic dependence, on Suboxone, hypertension, presented with drug overdose  Patient is extremely lethargic and only mumbling when answering my questions.  All history given by daughter over the phone.  Patient did show up in the ED saying that she was overdosed on Xanax and Suboxone.  When patient was found to be hypothermic and shaking and confused.  Blood pressure stable and very lethargic.  Daughter reported that the patient might have threatened to overdose on Xanax and Suboxone and Atarax for 2 to 3 days.  Review of Systems: Unable to perform, patient lethargic and only mumbling Past Medical History:  Diagnosis Date   Anemia 05/24/2022   Arthritis    COVID-19    ETOH abuse    Hepatitis C    pt states she has been cured   Hypertension    Seizures (HCC)     Past Surgical History:  Procedure Laterality Date   ABDOMINAL HYSTERECTOMY     "partial hysterectomy"   ELBOW SURGERY     ESOPHAGOGASTRODUODENOSCOPY (EGD) WITH PROPOFOL N/A 05/24/2022   Procedure: ESOPHAGOGASTRODUODENOSCOPY (EGD) WITH PROPOFOL;  Surgeon: Toney Reil, MD;  Location: [facility] ENDOSCOPY;  Service: Gastroenterology;  Laterality: N/A;     reports that she has been smoking cigarettes. She has never used smokeless tobacco. She reports current alcohol use. She reports that she does not use drugs.  Allergies  Allergen Reactions   Codeine Nausea And Vomiting   Morphine And Codeine Other (See Comments)    Seizure    Sulfa Antibiotics  Nausea And Vomiting    Family History  Problem Relation Age of Onset   Diabetes Sister    Colon cancer Sister      Prior to Admission medications   Medication Sig Start Date End Date Taking? Authorizing Provider  ALPRAZolam Prudy Feeler) 1 MG tablet Take 1 mg by mouth 3 (three) times daily. 05/13/22   [provider]  amLODipine (NORVASC) 5 MG tablet Take 5 mg by mouth daily. 03/11/22   [provider]  buprenorphine-naloxone (SUBOXONE) 8-2 mg SUBL SL tablet Place 1 tablet under the tongue 2 (two) times daily. 05/13/22   [provider]  Cholecalciferol 50 MCG (2000 UT) CAPS Take 1 tablet by mouth daily. 01/30/21   [provider]  citalopram (CELEXA) 40 MG tablet Take 40 mg by mouth daily. 04/15/22   [provider]  dextroamphetamine (DEXTROSTAT) 10 MG tablet Take 15 mg by mouth 2 (two) times daily. 02/19/23   [provider]  furosemide (LASIX) 20 MG tablet Take 20 mg by mouth daily as needed. 02/25/22   [provider]  Multiple Vitamin (MULTIVITAMIN WITH MINERALS) TABS tablet Take 1 tablet by mouth daily. 05/03/22   Enedina Finner, MD  thiamine (VITAMIN B-1) 100 MG tablet Take 1 tablet (100 mg total) by mouth daily. 05/03/22   Enedina Finner, MD    Physical Exam: Vitals:   07/13/23 1300 07/13/23 1357 07/13/23 1430 07/13/23 1507  BP: (!) 167/84  (!) 152/69   Pulse: 70  70 68  Resp: 19  16 14  Temp:  (!) 92.9 F (33.8 C)  (!) 94.1 F (34.5 C)  TempSrc:  Oral  Rectal  SpO2: 96%  97%   Weight:        Constitutional: NAD, calm, comfortable Vitals:   07/13/23 1300 07/13/23 1357 07/13/23 1430 07/13/23 1507  BP: (!) 167/84  (!) 152/69   Pulse: 70  70 68  Resp: 19  16 14   Temp:  (!) 92.9 F (33.8 C)  (!) 94.1 F (34.5 C)  TempSrc:  Oral  Rectal  SpO2: 96%  97%   Weight:       Eyes: PERRL, lids and conjunctivae normal ENMT: Mucous membranes are moist. Posterior pharynx clear of any exudate or lesions.Normal dentition.  Neck: normal,  supple, no masses, no thyromegaly Respiratory: clear to auscultation bilaterally, no wheezing, no crackles. Normal respiratory effort. No accessory muscle use.  Cardiovascular: Regular rate and rhythm, no murmurs / rubs / gallops. No extremity edema. 2+ pedal pulses. No carotid bruits.  Abdomen: no tenderness, no masses palpated. No hepatosplenomegaly. Bowel sounds positive.  Musculoskeletal: no clubbing / cyanosis. No joint deformity upper and lower extremities. Good ROM, no contractures. Normal muscle tone.  Skin: no rashes, lesions, ulcers. No induration Neurologic: No facial droops, following command. Psychiatric: Lethargic, open eyes to physical stimulation, oriented to herself, confused about time and place    Labs on Admission: I have personally reviewed following labs and imaging studies  CBC: Recent Labs  Lab 07/13/23 1300  WBC 7.3  HGB 12.4  HCT 37.4  MCV 95.9  PLT 222   Basic Metabolic Panel: Recent Labs  Lab 07/13/23 1232  NA 136  K 4.6  CL 102  CO2 25  GLUCOSE 123*  BUN 12  CREATININE 0.65  CALCIUM 9.1   GFR: Estimated Creatinine Clearance: 63 mL/min (by C-G formula based on SCr of 0.65 mg/dL). Liver Function Tests: Recent Labs  Lab 07/13/23 1232  AST 29  ALT 25  ALKPHOS 95  BILITOT 0.7  PROT 7.3  ALBUMIN 3.8   No results for input(s): "LIPASE", "AMYLASE" in the last 168 hours. No results for input(s): "AMMONIA" in the last 168 hours. Coagulation Profile: No results for input(s): "INR", "PROTIME" in the last 168 hours. Cardiac Enzymes: No results for input(s): "CKTOTAL", "CKMB", "CKMBINDEX", "TROPONINI" in the last 168 hours. BNP (last 3 results) No results for input(s): "PROBNP" in the last 8760 hours. HbA1C: No results for input(s): "HGBA1C" in the last 72 hours. CBG: No results for input(s): "GLUCAP" in the last 168 hours. Lipid Profile: No results for input(s): "CHOL", "HDL", "LDLCALC", "TRIG", "CHOLHDL", "LDLDIRECT" in the last 72  hours. Thyroid Function Tests: No results for input(s): "TSH", "T4TOTAL", "FREET4", "T3FREE", "THYROIDAB" in the last 72 hours. Anemia Panel: No results for input(s): "VITAMINB12", "FOLATE", "FERRITIN", "TIBC", "IRON", "RETICCTPCT" in the last 72 hours. Urine analysis:    Component Value Date/Time   COLORURINE AMBER (A) 05/23/2022 1400   APPEARANCEUR HAZY (A) 05/23/2022 1400   LABSPEC 1.016 05/23/2022 1400   PHURINE 6.0 05/23/2022 1400   GLUCOSEU NEGATIVE 05/23/2022 1400   HGBUR NEGATIVE 05/23/2022 1400   BILIRUBINUR NEGATIVE 05/23/2022 1400   KETONESUR 20 (A) 05/23/2022 1400   PROTEINUR NEGATIVE 05/23/2022 1400   NITRITE NEGATIVE 05/23/2022 1400   LEUKOCYTESUR NEGATIVE 05/23/2022 1400    Radiological Exams on Admission: DG Chest Port 1 View  Result Date: 07/13/2023 CLINICAL DATA:  67 year old female history of drug overdose. EXAM: PORTABLE CHEST 1 VIEW COMPARISON:  Chest x-ray  03/05/2023. FINDINGS: Multiple rib fractures bilaterally, including several acute left-sided rib fractures, similar to the prior study. Lung volumes are low. Bibasilar opacities which may reflect areas of atelectasis and/or consolidation, increased compared to the prior study. Small bilateral pleural effusions. No pneumothorax. No evidence of pulmonary edema. Heart size is normal. Upper mediastinal contours are distorted by patient's rotation to the left. Atherosclerotic calcifications are noted in the thoracic aorta. IMPRESSION: 1. Low lung volumes with bibasilar areas of atelectasis and/or consolidation and small bilateral pleural effusions. 2. Multiple bilateral rib fractures redemonstrated, including several left-sided rib fractures which appear likely acute or late subacute. No pneumothorax noted at this time. Electronically Signed   By: Trudie Reed M.D.   On: 07/13/2023 13:14    EKG: Independently reviewed.  Sinus rhythm, no acute ST changes.  Assessment/Plan Principal Problem:   Drug overdose Active  Problems:   AMS (altered mental status)   Benzodiazepine overdose  (please populate well all problems here in Problem List. (For example, if patient is on BP meds at home and you resume or decide to hold them, it is a problem that needs to be her. Same for CAD, COPD, HLD and so on)  Hypothermia -Probably secondary to Xanax and Suboxone overdose -Patient sufficiently protecting airway, no indication for Narcan or Flumazenil, admitted to stepdown unit for close monitoring. -CIWA protocol to watch for withdrawal symptoms -Discussed with daughter over the phone, daughter recommended that the patient is very easily withdraw from Xanax even after overdose and recommend we put some benzo diazepam on board.  For now, start CIWA protocol with as needed benzos. -Psychiatry consulted to help out to restart her psychiatry medications.  Acute metabolic encephalopathy, GCS=9 -Sufficiently protecting airway -Secondary to overdose from benzo and narcotics -CT head -Management of withdrawal symptoms as above  Benzodiazepine overdose -Monitor seizure activity, seizure precaution -CIWA protocol with as needed Ativan  Probably suicidal attempt -Daughter also suspect patient may have suicidal attempt as she has threatened family about planning to end her life several times since last year. -1:1 -IVC -Psy to see patient when she is more awake for further screening  Alcohol abuse Xanax abuse -On CIWA protocol with as needed benzos to treat withdrawal symptoms.  Psychiatry consulted to restart Xanax  HTN -Hold off p.o. BP medication -As needed hydralazine  Suboxone therapy -Resume Suboxone once mentation improves and able to take p.o.  DVT prophylaxis: Lovenox Code Status: Full code Family Communication: Daughter over the phone Disposition Plan: Patient is sick with severe hypothermia and benzodiazepine withdrawal, requiring close monitoring and inpatient psychiatry evaluation, expect more than 2  midnight hospital stay. Consults called: Psychiatry Admission status: Stepdown unit   Emeline General MD [facility] Pager [phone]  07/13/2023, 4:06 PM

## 2023-07-13 NOTE — ED Provider Notes (Signed)
[facility] Provider Note    Event Date/Time   First MD Initiated Contact with Patient 07/13/23 1215     (approximate)   History   Drug Overdose   HPI  Stephanie Skinner is a 67 y.o. female with a history of polysubstance abuse, anxiety disorder who presents after reported overdose.  Patient admitted to nurse of taking 10 Xanax last night.  When asked why she only says "my boyfriend is dying "     Physical Exam   Triage Vital Signs: ED Triage Vitals  Encounter Vitals Group     BP 07/13/23 1220 (!) 166/72     Systolic BP Percentile --      Diastolic BP Percentile --      Pulse Rate 07/13/23 1220 67     Resp 07/13/23 1220 13     Temp --      Temp src --      SpO2 07/13/23 1220 99 %     Weight 07/13/23 1222 61.2 kg (135 lb)     Height --      Head Circumference --      Peak Flow --      Pain Score --      Pain Loc --      Pain Education --      Exclude from Growth Chart --     Most recent vital signs: Vitals:   07/13/23 1300 07/13/23 1357  BP: (!) 167/84   Pulse: 70   Resp: 19   Temp:  (!) 92.9 F (33.8 C)  SpO2: 96%      General: Awake, arousable to voice CV:  Good peripheral perfusion.  Resp:  Normal effort.  Abd:  No distention.  Soft, nontender Other:  Moving all extremities   ED Results / Procedures / Treatments   Labs (all labs ordered are listed, but only abnormal results are displayed) Labs Reviewed  COMPREHENSIVE METABOLIC PANEL - Abnormal; Notable for the following components:      Result Value   Glucose, Bld 123 (*)    All other components within normal limits  SALICYLATE LEVEL - Abnormal; Notable for the following components:   Salicylate Lvl <7.0 (*)    All other components within normal limits  ACETAMINOPHEN LEVEL - Abnormal; Notable for the following components:   Acetaminophen (Tylenol), Serum <10 (*)    All other components within normal limits  CULTURE, BLOOD (ROUTINE X 2)  CULTURE, BLOOD (ROUTINE X 2)   CBC  LACTIC ACID, PLASMA  LACTIC ACID, PLASMA     EKG  ED ECG REPORT I, Jene Every, the attending physician, personally viewed and interpreted this ECG.  Date: 07/13/2023  Rhythm: normal sinus rhythm QRS Axis: normal Intervals: normal ST/T Wave abnormalities: normal Narrative Interpretation: no evidence of acute ischemia    RADIOLOGY Chest x-ray viewed interpreted by me, possible infiltrates versus atelectasis   PROCEDURES:  Critical Care performed: yes  CRITICAL CARE Performed by: Jene Every   Total critical care time: 30 minutes  Critical care time was exclusive of separately billable procedures and treating other patients.  Critical care was necessary to treat or prevent imminent or life-threatening deterioration.  Critical care was time spent personally by me on the following activities: development of treatment plan with patient and/or surrogate as well as nursing, discussions with consultants, evaluation of patient's response to treatment, examination of patient, obtaining history from patient or surrogate, ordering and performing treatments and interventions, ordering and review of  laboratory studies, ordering and review of radiographic studies, pulse oximetry and re-evaluation of patient's condition.   Procedures   MEDICATIONS ORDERED IN ED: Medications  cefTRIAXone (ROCEPHIN) 1 g in sodium chloride 0.9 % 100 mL IVPB (has no administration in time range)  azithromycin (ZITHROMAX) 500 mg in dextrose 5 % 250 mL IVPB (has no administration in time range)  0.9 %  sodium chloride infusion ( Intravenous New Bag/Given 07/13/23 1247)     IMPRESSION / MDM / ASSESSMENT AND PLAN / ED COURSE  I reviewed the triage vital signs and the nursing notes. Patient's presentation is most consistent with acute presentation with potential threat to life or bodily function.  Patient presents with drug overdose, likely suicide attempt.  Vital signs reassuring at this  time, not requiring supplemental oxygen.  Placed on the cardiac monitor, will obtain labs, consult TTS and psychiatry, place under involuntary commitment  Patient is hypothermic, bear hugger applied, warm IV fluids infusing, not consistent with sepsis, labs pending however  ----------------------------------------- 1:58 PM on 07/13/2023 ----------------------------------------- Chest x-ray with possible infiltrates, given hypothermia which I suspect is related to her significant overdose but could also be related to pneumonia we will blood cultures, lactic acid, will treat with IV Rocephin, IV azithromycin will require medical admission, have consulted the hospitalist team, psychiatry has also been consulted.  The patient is involuntarily committed      FINAL CLINICAL IMPRESSION(S) / ED DIAGNOSES   Final diagnoses:  Intentional drug overdose, initial encounter (HCC)  Hypothermia, initial encounter     Rx / DC Orders   ED Discharge Orders     None        Note:  This document was prepared using Dragon voice recognition software and may include unintentional dictation errors.   Jene Every, MD 07/13/23 1447

## 2023-07-13 NOTE — ED Notes (Signed)
Pt called out. Pt was asking for some sprite at this time. Pt is more alert than earlier. Pt asking can she go home and why is she sick. RN informed her about why she was here.

## 2023-07-13 NOTE — Consult Note (Signed)
  Patient asleep. Unable to complete evaluation at this time.

## 2023-07-13 NOTE — ED Notes (Signed)
Pt was dressed out into beh scrubs with this RN, Vernard Gambles, EDT and Marylene Land, CRN present. Belongings were placed in belongings bag and include:  1 pair of black pants 1 pair of blue hospital socks 1 brown sweatshirt 1 pair of black slippers

## 2023-07-13 NOTE — ED Notes (Signed)
This RN to bedside to do report with RN Aundra Millet. Pt is drenched in cold urine all the way to her sheets. RN stayed to assist this RN to change patient, sheets and brief. Pt is alert, but not making much sense when she speaks.

## 2023-07-13 NOTE — ED Notes (Signed)
Called CCMD about placing Pt on central cardiac monitoring

## 2023-07-13 NOTE — ED Triage Notes (Addendum)
BIBEMS, coming from home. Neighbor called for empty bottles of flexeril and clonidine. Pt reports taking 10 xanax last night. 170/90, 62 HR. BGL: 153. 97% on 2L Comfort. Pt reports falling @home  yesterday. Denies SI, HI. Pt is drowsy. Pt states her boyfriend is dying and told EMS "she wanted to be with him".

## 2023-07-13 NOTE — ED Notes (Signed)
[employer] and multiple blankets at this time

## 2023-07-13 NOTE — BH Assessment (Signed)
TTS is unable to complete consult. Patient cannot participate in the interview at this time.

## 2023-07-14 ENCOUNTER — Other Ambulatory Visit: Payer: Self-pay

## 2023-07-14 ENCOUNTER — Encounter: Payer: Self-pay | Admitting: Internal Medicine

## 2023-07-14 DIAGNOSIS — T50902A Poisoning by unspecified drugs, medicaments and biological substances, intentional self-harm, initial encounter: Secondary | ICD-10-CM | POA: Diagnosis not present

## 2023-07-14 DIAGNOSIS — F332 Major depressive disorder, recurrent severe without psychotic features: Secondary | ICD-10-CM | POA: Diagnosis present

## 2023-07-14 DIAGNOSIS — T50901A Poisoning by unspecified drugs, medicaments and biological substances, accidental (unintentional), initial encounter: Secondary | ICD-10-CM | POA: Diagnosis present

## 2023-07-14 LAB — BASIC METABOLIC PANEL
Anion gap: 6 (ref 5–15)
BUN: 12 mg/dL (ref 8–23)
CO2: 25 mmol/L (ref 22–32)
Calcium: 8.5 mg/dL — ABNORMAL LOW (ref 8.9–10.3)
Chloride: 105 mmol/L (ref 98–111)
Creatinine, Ser: 0.65 mg/dL (ref 0.44–1.00)
GFR, Estimated: >60 mL/min (ref >60)
Glucose, Bld: 120 mg/dL — ABNORMAL HIGH (ref 70–99)
Potassium: 3.8 mmol/L (ref 3.5–5.1)
Sodium: 136 mmol/L (ref 135–145)

## 2023-07-14 LAB — CBC
HCT: 36.6 % (ref 36.0–46.0)
Hemoglobin: 12.1 g/dL (ref 12.0–15.0)
MCH: 30.9 pg (ref 26.0–34.0)
MCHC: 33.1 g/dL (ref 30.0–36.0)
MCV: 93.4 fL (ref 80.0–100.0)
Platelets: 237 10*3/uL (ref 150–400)
RBC: 3.92 MIL/uL (ref 3.87–5.11)
RDW: 13.2 % (ref 11.5–15.5)
WBC: 5.8 10*3/uL (ref 4.0–10.5)
nRBC: 0 % (ref 0.0–0.2)

## 2023-07-14 LAB — GLUCOSE, CAPILLARY: Glucose-Capillary: 256 mg/dL — ABNORMAL HIGH (ref 70–99)

## 2023-07-14 LAB — HIV ANTIBODY (ROUTINE TESTING W REFLEX): HIV Screen 4th Generation wRfx: NONREACTIVE

## 2023-07-14 MED ORDER — BUPRENORPHINE HCL-NALOXONE HCL 8-2 MG SL SUBL
1.0000 | SUBLINGUAL_TABLET | Freq: Two times a day (BID) | SUBLINGUAL | Status: DC
  Administered 2023-07-14 – 2023-07-15 (×3): 1 via SUBLINGUAL
  Filled 2023-07-14 (×3): qty 1

## 2023-07-14 MED ORDER — IBUPROFEN 400 MG PO TABS
400.0000 mg | ORAL_TABLET | Freq: Four times a day (QID) | ORAL | Status: DC | PRN
  Filled 2023-07-14: qty 1

## 2023-07-14 MED ORDER — ALPRAZOLAM 0.5 MG PO TABS
0.5000 mg | ORAL_TABLET | Freq: Three times a day (TID) | ORAL | Status: DC
  Administered 2023-07-14 – 2023-07-15 (×3): 0.5 mg via ORAL
  Filled 2023-07-14 (×3): qty 1

## 2023-07-14 MED ORDER — DEXTROAMPHETAMINE SULFATE 5 MG PO TABS: 15.0000 mg | ORAL_TABLET | Freq: Two times a day (BID) | ORAL | Status: DC

## 2023-07-14 MED ORDER — CLONIDINE HCL 0.1 MG PO TABS
0.1000 mg | ORAL_TABLET | Freq: Two times a day (BID) | ORAL | Status: DC
Start: 2023-07-14 — End: 2023-07-15
  Administered 2023-07-14 – 2023-07-15 (×3): 0.1 mg via ORAL
  Filled 2023-07-14 (×3): qty 1

## 2023-07-14 MED ORDER — CITALOPRAM HYDROBROMIDE 10 MG PO TABS
40.0000 mg | ORAL_TABLET | Freq: Every day | ORAL | Status: DC
  Administered 2023-07-14 – 2023-07-15 (×2): 40 mg via ORAL
  Filled 2023-07-14 (×2): qty 4

## 2023-07-14 MED ORDER — BUTALBITAL-APAP-CAFFEINE 50-325-40 MG PO TABS
1.0000 | ORAL_TABLET | Freq: Four times a day (QID) | ORAL | Status: DC | PRN
  Administered 2023-07-14 – 2023-07-15 (×3): 1 via ORAL
  Filled 2023-07-14 (×3): qty 1

## 2023-07-14 NOTE — ED Notes (Signed)
PT IS IVC/ PENDING PSYCH CONSULT.

## 2023-07-14 NOTE — ED Notes (Signed)
Assisted pt getting up to restroom and back in bed.

## 2023-07-14 NOTE — ED Notes (Signed)
Patient c/o headache/migraine. Patient was provided with Tylenol, which she took, but stated that it wouldn't help anything. Patient asked if her daughter could be called and if the status of her boyfriend/husband could be ascertained.

## 2023-07-14 NOTE — TOC Initial Note (Signed)
Transition of Care [facility]) - Initial/Assessment Note    Patient Details  Name: Stephanie Skinner MRN: [id number] Date of Birth: 01-06-1956  [facility]) CM/SW Contact:    Marquita Palms, LCSW Phone Number: 07/14/2023, 11:44 AM  Clinical Narrative:                  CSW met with patient bedside. Patient was crying and then angry. She reports her significant other is currently in hospice "dying." CSW questioned patient about her stress level and patient reported that she needs to be with him but she was "falling." Patient became non responsive when asked question about current needs. Patient does not want information on substance abuse at this time. Patient reports she did not try to "kill herself."       Patient Goals and CMS Choice            Expected Discharge Plan and Services                                              Prior Living Arrangements/Services                       Activities of Daily Living   ADL Screening (condition at time of admission) Independently performs ADLs?: No Does the patient have a NEW difficulty with bathing/dressing/toileting/self-feeding that is expected to last >3 days?: No Does the patient have a NEW difficulty with getting in/out of bed, walking, or climbing stairs that is expected to last >3 days?: No Does the patient have a NEW difficulty with communication that is expected to last >3 days?: No Is the patient deaf or have difficulty hearing?: No Does the patient have difficulty seeing, even when wearing glasses/contacts?: No Does the patient have difficulty concentrating, remembering, or making decisions?: No  Permission Sought/Granted                  Emotional Assessment              Admission diagnosis:  Drug overdose [T50.901A] Hypothermia [T68.XXXA] Overdose [T50.901A] Patient Active Problem List   Diagnosis Date Noted   Major depressive disorder, recurrent severe without psychotic  features (HCC) 07/14/2023   Overdose 07/14/2023   Benzodiazepine overdose 07/13/2023   Drug overdose 07/13/2023   Benzodiazepine withdrawal (HCC) 07/13/2023   Hypothermia 07/13/2023   Chest pain 03/05/2023   Elevated troponin 03/05/2023   Cellulitis of right leg 03/05/2023   Closed fracture of left zygomatic arch (HCC)    Fracture of left orbital wall (HCC)    Gastrointestinal hemorrhage    Malnutrition of moderate degree 05/24/2022   ABLA (acute blood loss anemia) 05/23/2022   Frequent falls 05/23/2022   Symptomatic anemia    COVID-19 virus infection    Benzodiazepine withdrawal with delirium (HCC)    AMS (altered mental status) 04/27/2022   Alcohol abuse 04/27/2022   Essential hypertension 04/27/2022   Hypokalemia 04/27/2022   Acute respiratory failure with hypoxia (HCC)    Generalized anxiety disorder 08/10/2017   Benzodiazepine abuse (HCC) 08/10/2017   Opiate abuse, continuous (HCC) 08/10/2017   Dehydration 07/20/2017   PCP:  Charlies Constable, MD Pharmacy:   [facility], [place] - [address] [place] [address] Phone: [phone] Fax: [phone]  [id number] - [place], [place] - [place]  [address] [place] [address] Phone: [phone] Fax: [phone]  [facility] [address] [place] [address] Phone: [phone] Fax: [phone]     Social Determinants of Health (SDOH) Social History: SDOH Screenings   Food Insecurity: No Food Insecurity (07/14/2023)  Housing: Low Risk  (07/14/2023)  Transportation Needs: No Transportation Needs (07/14/2023)  Utilities: Not At Risk (07/14/2023)  Financial Resource Strain: Low Risk  (05/17/2022)   Received from [facility], [facility]  Tobacco Use: High Risk (07/14/2023)   SDOH Interventions:     Readmission Risk Interventions     No data to display

## 2023-07-14 NOTE — Progress Notes (Signed)
PROGRESS NOTE    KARRYN WITHEE  UUV:[id number] DOB: 11-Sep-1955 DOA: 07/13/2023 PCP: Charlies Constable, MD    Brief Narrative:  67 y.o. female with medical history significant of benzodiazepine dependence, alcohol dependence, narcotic dependence, on Suboxone, hypertension, presented with drug overdose   Patient is extremely lethargic and only mumbling when answering my questions.  All history given by daughter over the phone.  Patient did show up in the ED saying that she was overdosed on Xanax and Suboxone.  When patient was found to be hypothermic and shaking and confused.  Blood pressure stable and very lethargic.  Daughter reported that the patient might have threatened to overdose on Xanax and Suboxone and Atarax for 2 to 3 days.   Assessment & Plan:   Principal Problem:   Drug overdose Active Problems:   AMS (altered mental status)   Benzodiazepine withdrawal (HCC)   Hypothermia   Major depressive disorder, recurrent severe without psychotic features (HCC)   Overdose  Hypothermia, resolved -Probably secondary to Xanax and Suboxone overdose -Patient sufficiently protecting airway, no indication for Narcan or Flumazenil, admitted to stepdown unit for close monitoring. -Hypothermia resolved Plan: Slowly restart home benzodiazepine and Suboxone regimen Medically stable for discharge    Acute metabolic encephalopathy, resolved -Sufficiently protecting airway -Secondary to overdose from benzo and narcotics -CT head reassuring Plan: -Management of withdrawal symptoms as above   Benzodiazepine overdose -Monitor seizure activity, seizure precaution -CIWA protocol with as needed Ativan -Restart home Xanax   Probable suicidal attempt -Daughter also suspect patient may have suicidal attempt as she has threatened family about planning to end her life several times since last year. Plan: -1:1 -IVC -Psychiatry will admit to geropsych when bed available -Patient is medically  cleared for discharge to the inpatient psychiatric unit at this time   Alcohol abuse Xanax abuse -On CIWA protocol with as needed benzos to treat withdrawal symptoms.     HTN -Restart home clonidine -As needed hydralazine   Suboxone therapy -Restart Suboxone   DVT prophylaxis: Lovenox Code Status: Full Family Communication: None Disposition Plan: Status is: Inpatient Remains inpatient appropriate because: Under IVC.  Plan for admission to inpatient behavioral health unit.  Medically ready for discharge at this time.   Level of care: Med-Surg  Consultants:  Psychiatry  Procedures:  None  Antimicrobials: None   Subjective: Seen and examined peer resting in bed.  Alert oriented x 3.  Appears fatigued.  Tearful.  Objective: Vitals:   07/14/23 0830 07/14/23 0900 07/14/23 0930 07/14/23 1000  BP: (!) 148/71 (!) 151/68 (!) 163/79 (!) 152/67  Pulse: 83 73 77 72  Resp: (!) 22 14 13    Temp:      TempSrc:      SpO2: 97% 98% 98% 98%  Weight:      Height:  5\' 6"  (1.676 m)     No intake or output data in the 24 hours ending 07/14/23 1137 Filed Weights   07/13/23 1222  Weight: 61.2 kg    Examination:  General exam: Tearful Respiratory system: Clear to auscultation. Respiratory effort normal. Cardiovascular system: S1-2, RRR, no murmurs, no pedal edema Gastrointestinal system: Soft, NT/ND, normal bowel sounds Central nervous system: Alert and oriented. No focal neurological deficits. Extremities: Symmetric 5 x 5 power. Skin: No rashes, lesions or ulcers Psychiatry: Judgement and insight appear impaired. Mood & affect sad.     Data Reviewed: I have personally reviewed following labs and imaging studies  CBC: Recent Labs  Lab 07/13/23 1300 07/14/23  0510  WBC 7.3 5.8  HGB 12.4 12.1  HCT 37.4 36.6  MCV 95.9 93.4  PLT 222 237   Basic Metabolic Panel: Recent Labs  Lab 07/13/23 1232 07/14/23 0510  NA 136 136  K 4.6 3.8  CL 102 105  CO2 25 25  GLUCOSE  123* 120*  BUN 12 12  CREATININE 0.65 0.65  CALCIUM 9.1 8.5*   GFR: Estimated Creatinine Clearance: 63 mL/min (by C-G formula based on SCr of 0.65 mg/dL). Liver Function Tests: Recent Labs  Lab 07/13/23 1232  AST 29  ALT 25  ALKPHOS 95  BILITOT 0.7  PROT 7.3  ALBUMIN 3.8   No results for input(s): "LIPASE", "AMYLASE" in the last 168 hours. No results for input(s): "AMMONIA" in the last 168 hours. Coagulation Profile: No results for input(s): "INR", "PROTIME" in the last 168 hours. Cardiac Enzymes: No results for input(s): "CKTOTAL", "CKMB", "CKMBINDEX", "TROPONINI" in the last 168 hours. BNP (last 3 results) No results for input(s): "PROBNP" in the last 8760 hours. HbA1C: No results for input(s): "HGBA1C" in the last 72 hours. CBG: No results for input(s): "GLUCAP" in the last 168 hours. Lipid Profile: No results for input(s): "CHOL", "HDL", "LDLCALC", "TRIG", "CHOLHDL", "LDLDIRECT" in the last 72 hours. Thyroid Function Tests: No results for input(s): "TSH", "T4TOTAL", "FREET4", "T3FREE", "THYROIDAB" in the last 72 hours. Anemia Panel: No results for input(s): "VITAMINB12", "FOLATE", "FERRITIN", "TIBC", "IRON", "RETICCTPCT" in the last 72 hours. Sepsis Labs: Recent Labs  Lab 07/13/23 1434 07/13/23 1704  LATICACIDVEN 0.7 0.7    Recent Results (from the past 240 hour(s))  Blood culture (routine x 2)     Status: None (Preliminary result)   Collection Time: 07/13/23  2:34 PM   Specimen: BLOOD  Result Value Ref Range Status   Specimen Description BLOOD BLOOD RIGHT ARM  Final   Special Requests   Final    BOTTLES DRAWN AEROBIC AND ANAEROBIC Blood Culture adequate volume   Culture   Final    NO GROWTH < 24 HOURS Performed at [facility], [address] [place], [place] [address]    Report Status PENDING  Incomplete  Blood culture (routine x 2)     Status: None (Preliminary result)   Collection Time: 07/13/23  2:34 PM   Specimen: BLOOD  Result Value  Ref Range Status   Specimen Description BLOOD BLOOD LEFT ARM  Final   Special Requests   Final    BOTTLES DRAWN AEROBIC AND ANAEROBIC Blood Culture adequate volume   Culture   Final    NO GROWTH < 24 HOURS Performed at [facility], [address], [place], [place] [address]    Report Status PENDING  Incomplete         Radiology Studies: CT HEAD WO CONTRAST ( )  Result Date: 07/13/2023 CLINICAL DATA:  Mental status change, unknown cause. EXAM: CT HEAD WITHOUT CONTRAST TECHNIQUE: Contiguous axial images were obtained from the base of the skull through the vertex without intravenous contrast. RADIATION DOSE REDUCTION: This exam was performed according to the departmental dose-optimization program which includes automated exposure control, adjustment of the mA and/or kV according to patient size and/or use of iterative reconstruction technique. COMPARISON:  Head CT 03/05/2023 FINDINGS: Brain: There is no evidence of an acute infarct, intracranial hemorrhage, mass, midline shift, or extra-axial fluid collection. There is mild cerebral atrophy. Periventricular white matter hypodensities are unchanged and nonspecific but compatible with mild chronic small vessel ischemic disease. Vascular: Calcified atherosclerosis at the skull  base. No hyperdense vessel. Skull: Remote maxillofacial fractures. Sinuses/Orbits: Left cataract extraction. The included paranasal sinuses and mastoid air cells are clear. Other: None. IMPRESSION: 1. No evidence of acute intracranial abnormality. 2. Mild chronic small vessel ischemic disease. Electronically Signed   By: Sebastian Ache M.D.   On: 07/13/2023 16:38   DG Chest Port 1 View  Result Date: 07/13/2023 CLINICAL DATA:  67 year old female history of drug overdose. EXAM: PORTABLE CHEST 1 VIEW COMPARISON:  Chest x-ray 03/05/2023. FINDINGS: Multiple rib fractures bilaterally, including several acute left-sided rib fractures, similar to the prior study. Lung  volumes are low. Bibasilar opacities which may reflect areas of atelectasis and/or consolidation, increased compared to the prior study. Small bilateral pleural effusions. No pneumothorax. No evidence of pulmonary edema. Heart size is normal. Upper mediastinal contours are distorted by patient's rotation to the left. Atherosclerotic calcifications are noted in the thoracic aorta. IMPRESSION: 1. Low lung volumes with bibasilar areas of atelectasis and/or consolidation and small bilateral pleural effusions. 2. Multiple bilateral rib fractures redemonstrated, including several left-sided rib fractures which appear likely acute or late subacute. No pneumothorax noted at this time. Electronically Signed   By: Trudie Reed M.D.   On: 07/13/2023 13:14        Scheduled Meds:  enoxaparin (LOVENOX) injection  40 mg Subcutaneous Q24H   folic acid  1 mg Oral Daily   multivitamin with minerals  1 tablet Oral Daily   thiamine  100 mg Oral Daily   Or   thiamine  100 mg Intravenous Daily   Continuous Infusions:   LOS: 1 day    Tresa Moore, MD [facility]   If 7PM-7AM, please contact night-coverage  07/14/2023, 11:37 AM

## 2023-07-14 NOTE — Evaluation (Addendum)
Occupational Therapy Evaluation Patient Details Name: Stephanie Skinner MRN: [id number] DOB: 28-Feb-1956 Today's Date: 07/14/2023   History of Present Illness 67 y.o. female with medical history significant of benzodiazepine dependence, alcohol dependence, narcotic dependence, on Suboxone, hypertension, presented with drug overdose. CT head negative.   Clinical Impression   Pt was seen for OT evaluation this date. Prior to hospital admission, pt lives at home with her husband who is on hospice care. She reports IND with ADL/IADLs and able to drive.  Pt presents to acute OT demonstrating no functional decline in regard to ADL performance, functional transfers/mobility with biggest limitation being her desire to return home to her husband on hospice. Does endorse 10/10 head and back pain (scoliosis per report). Also requesting a nerve pill-sitter in room stating she could receive more meds at 1400pm. Tearful during session when asked what caused her falls at home and she stated "I would get dizzy, I just got overwhelmed and my husband is on hospice. Pt demo bed mobility IND, STS and in room mobility to bathroom with IND/MOD I. Toilet transfer with IND. Declined performance of any ADL activities as OT offered oral care, brushing her hair, changing into mesh panties with pt declining immediately. Offered coloring pages and recommended tv to place calming music to decrease stress/anxiety levels. Pt would benefit from motivation to continue participating in her ADLs and daily mobiity to prevent weakness, however do not feel she needs acute OT services or an OT follow up on DC. Recommend mobility tech and NT services to get her up to walk daily. *Addendum-can add additional OT order if noted decline or need for assistance with ADLs.*        If plan is discharge home, recommend the following:      Functional Status Assessment  Patient has not had a recent decline in their functional status  Equipment  Recommendations  None recommended by OT    Recommendations for Other Services       Precautions / Restrictions Precautions Precautions: Fall Restrictions Weight Bearing Restrictions: No      Mobility Bed Mobility Overal bed mobility: Modified Independent                  Transfers Overall transfer level: Modified independent                 General transfer comment: STS from EOB with MOD I and ambulated to the door then bathroom and back to bed; declined any further ADLs/activity d/t headache      Balance Overall balance assessment: Modified Independent                                         ADL either performed or assessed with clinical judgement   ADL Overall ADL's : Modified independent                                       General ADL Comments: transferred to toilet with MOD I     Vision         Perception         Praxis         Pertinent Vitals/Pain Pain Assessment Pain Assessment: 0-10 Pain Score: 10-Worst pain ever Pain Location: head and back-per sitter can get more meds at 1400pm Pain  Descriptors / Indicators: Aching Pain Intervention(s): Monitored during session, Limited activity within patient's tolerance     Extremity/Trunk Assessment Upper Extremity Assessment Upper Extremity Assessment: Overall WFL for tasks assessed   Lower Extremity Assessment Lower Extremity Assessment: Overall WFL for tasks assessed       Communication Communication Communication: No apparent difficulties   Cognition Arousal: Alert Behavior During Therapy: WFL for tasks assessed/performed Overall Cognitive Status: Within Functional Limits for tasks assessed                                 General Comments: tearful at times when talking about her husband being on hospice and wanting to go back home     General Comments  balance appeared intact throughout evaluation with no LOB noted during bed  mobility, STS and mobility in the room with no AD use    Exercises Other Exercises Other Exercises: Edu on role of OT in acute setting and importanct of mobility and exercise to promote strength and IND to be able to return home safely.   Shoulder Instructions      Home Living Family/patient expects to be discharged to:: Private residence Living Arrangements: Spouse/significant other (spouse is on hospice)   Type of Home: House Home Access: Stairs to enter [employer] of Steps: 2 Entrance Stairs-Rails: Right;Left Home Layout: One level     Bathroom Shower/Tub: Chief Strategy Officer: Standard     Home Equipment: None          Prior Functioning/Environment Prior Level of Function : Independent/Modified Independent;History of Falls (last six months)             Mobility Comments: no AD use, reports able to drive to grocery store, MD appts, etc. ADLs Comments: IND with ADLs,IADLs; spouse is on hospice        OT Problem List: Pain      OT Treatment/Interventions:      OT Goals(Current goals can be found in the care plan section)    OT Frequency:      Co-evaluation              AM-PAC OT "6 Clicks" Daily Activity     Outcome Measure Help from another person eating meals?: None Help from another person taking care of personal grooming?: None Help from another person toileting, which includes using toliet, bedpan, or urinal?: None Help from another person bathing (including washing, rinsing, drying)?: None Help from another person to put on and taking off regular upper body clothing?: None Help from another person to put on and taking off regular lower body clothing?: None 6 Click Score: 24   End of Session Nurse Communication: Mobility status  Activity Tolerance: Patient tolerated treatment well Patient left: in bed;with call bell/phone within reach;with nursing/sitter in room  OT Visit Diagnosis: Other abnormalities of gait and  mobility (R26.89)                Time: 6045-4098 OT Time Calculation (min): 13 min Charges:  OT General Charges $OT Visit: 1 Visit OT Evaluation $OT Eval Low Complexity: 1 Low  Layia Walla, OTR/L 07/14/23, 1:56 PM  Emmauel Hallums E Farheen Pfahler 07/14/2023, 1:56 PM

## 2023-07-14 NOTE — Consult Note (Signed)
[facility] Face-to-Face Psychiatry Consult   Reason for Consult:  intentional overdose Referring Physician:  EDP Patient Identification: Stephanie Skinner MRN:  [id number] Principal Diagnosis: Drug overdose Diagnosis:  Principal Problem:   Drug overdose Active Problems:   AMS (altered mental status)   Benzodiazepine withdrawal (HCC)   Hypothermia   Major depressive disorder, recurrent severe without psychotic features (HCC)   Total Time spent with patient: 45 minutes  Subjective:   Stephanie Skinner is a 67 y.o. female patient admitted with intentional overdose, suicide attempt.Marland Kitchen  HPI:  67 yo female presented after taking an intentional overdose on Flexeril and clonidine to die with the goal to go be with her boyfriend who is dying.  On assessment, she was unable to remember what happened which concerned her neighbor, who was at her bed side.  She was confused and has a history of Suboxone therapy along with benzodiazepine and alcohol dependency.  Difficult to obtain much history except when she was told she would be admitted psychiatrically after she was medically cleared, she became upset and stated, "I don't need help".  Per notes, her daughter reported she had been threatening to overdose in the past 2-3 days.  Geriatric psych admission required.  Past Psychiatric History: depression, anxiety  Risk to Self:  yes Risk to Others:  no Prior Inpatient Therapy:  yes Prior Outpatient Therapy:  not able to   Past Medical History:  Past Medical History:  Diagnosis Date   Anemia 05/24/2022   Arthritis    COVID-19    ETOH abuse    Hepatitis C    pt states she has been cured   Hypertension    Seizures (HCC)     Past Surgical History:  Procedure Laterality Date   ABDOMINAL HYSTERECTOMY     "partial hysterectomy"   ELBOW SURGERY     ESOPHAGOGASTRODUODENOSCOPY (EGD) WITH PROPOFOL N/A 05/24/2022   Procedure: ESOPHAGOGASTRODUODENOSCOPY (EGD) WITH PROPOFOL;  Surgeon: Toney Reil, MD;   Location: [facility] ENDOSCOPY;  Service: Gastroenterology;  Laterality: N/A;   Family History:  Family History  Problem Relation Age of Onset   Diabetes Sister    Colon cancer Sister    Family Psychiatric  History: none Social History:  Social History   Substance and Sexual Activity  Alcohol Use Yes     Social History   Substance and Sexual Activity  Drug Use No    Social History   Socioeconomic History   Marital status: Divorced    Spouse name: Not on file   Number of children: Not on file   Years of education: Not on file   Highest education level: Not on file  Occupational History   Not on file  Tobacco Use   Smoking status: Every Day    Current packs/day: 1.00    Types: Cigarettes   Smokeless tobacco: Never  Vaping Use   Vaping status: Every Day  Substance and Sexual Activity   Alcohol use: Yes   Drug use: No   Sexual activity: Not Currently  Other Topics Concern   Not on file  Social History Narrative   Not on file   Social Determinants of Health   Financial Resource Strain: Low Risk  (05/17/2022)   Received from [facility]   Overall Financial Resource Strain (CARDIA)    Difficulty of Paying Living Expenses: Not hard at all  Food Insecurity: No Food Insecurity (07/14/2023)   Hunger Vital Sign    Worried About Running Out  of Food in the Last Year: Never true    Ran Out of Food in the Last Year: Never true  Transportation Needs: No Transportation Needs (07/14/2023)   PRAPARE - Administrator, Civil Service (Medical): No    Lack of Transportation (Non-Medical): No  Physical Activity: Not on file  Stress: Not on file  Social Connections: Not on file   Additional Social History:    Allergies:   Allergies  Allergen Reactions   Codeine Nausea And Vomiting   Morphine And Codeine Other (See Comments)    Seizure    Sulfa Antibiotics Nausea And Vomiting    Labs:  Results for orders placed or performed during the  hospital encounter of 07/13/23 (from the past 48 hour(s))  Comprehensive metabolic panel     Status: Abnormal   Collection Time: 07/13/23 12:32 PM  Result Value Ref Range   Sodium 136 135 - 145 mmol/L   Potassium 4.6 3.5 - 5.1 mmol/L   Chloride 102 98 - 111 mmol/L   CO2 25 22 - 32 mmol/L   Glucose, Bld 123 (H) 70 - 99 mg/dL    Comment: Glucose reference range applies only to samples taken after fasting for at least 8 hours.   BUN 12 8 - 23 mg/dL   Creatinine, Ser 9.79 0.44 - 1.00 mg/dL   Calcium 9.1 8.9 - 89.2 mg/dL   Total Protein 7.3 6.5 - 8.1 g/dL   Albumin 3.8 3.5 - 5.0 g/dL   AST 29 15 - 41 U/L   ALT 25 0 - 44 U/L   Alkaline Phosphatase 95 38 - 126 U/L   Total Bilirubin 0.7 <1.2 mg/dL   GFR, Estimated >11 >94 mL/min    Comment: (NOTE) Calculated using the CKD-EPI Creatinine Equation (2021)    Anion gap 9 5 - 15    Comment: Performed at [facility], [address], [place], [place] [address]  Salicylate level     Status: Abnormal   Collection Time: 07/13/23 12:32 PM  Result Value Ref Range   Salicylate Lvl <7.0 (L) 7.0 - 30.0 mg/dL    Comment: Performed at [facility], [address] [place], [place] [address]  Acetaminophen level     Status: Abnormal   Collection Time: 07/13/23 12:32 PM  Result Value Ref Range   Acetaminophen (Tylenol), Serum <10 (L) 10 - 30 ug/mL    Comment: (NOTE) Therapeutic concentrations vary significantly. A range of 10-30 ug/mL  may be an effective concentration for many patients. However, some  are best treated at concentrations outside of this range. Acetaminophen concentrations >150 ug/mL at 4 hours after ingestion  and >50 ug/mL at 12 hours after ingestion are often associated with  toxic reactions.  Performed at [facility], [address] [place], [place] [address]   Ethanol     Status: None   Collection Time: 07/13/23 12:50 PM  Result Value Ref Range   Alcohol, Ethyl (B) <10 <10 mg/dL    Comment:  (NOTE) Lowest detectable limit for serum alcohol is 10 mg/dL.  For medical purposes only. Performed at [facility], [address] [place], [place] [address]   CBC     Status: None   Collection Time: 07/13/23  1:00 PM  Result Value Ref Range   WBC 7.3 4.0 - 10.5 K/uL   RBC 3.90 3.87 - 5.11 MIL/uL   Hemoglobin 12.4 12.0 - 15.0 g/dL   HCT 63.7 85.8 - 85.0 %  MCV 95.9 80.0 - 100.0 fL   MCH 31.8 26.0 - 34.0 pg   MCHC 33.2 30.0 - 36.0 g/dL   RDW 84.6 96.2 - 95.2 %   Platelets 222 150 - 400 K/uL   nRBC 0.0 0.0 - 0.2 %    Comment: Performed at [facility], [address] [place], [place] [address]  Blood culture (routine x 2)     Status: None (Preliminary result)   Collection Time: 07/13/23  2:34 PM   Specimen: BLOOD  Result Value Ref Range   Specimen Description BLOOD BLOOD RIGHT ARM    Special Requests      BOTTLES DRAWN AEROBIC AND ANAEROBIC Blood Culture adequate volume   Culture      NO GROWTH < 24 HOURS Performed at [facility], [address], [place], [place] [address]    Report Status PENDING   Blood culture (routine x 2)     Status: None (Preliminary result)   Collection Time: 07/13/23  2:34 PM   Specimen: BLOOD  Result Value Ref Range   Specimen Description BLOOD BLOOD LEFT ARM    Special Requests      BOTTLES DRAWN AEROBIC AND ANAEROBIC Blood Culture adequate volume   Culture      NO GROWTH < 24 HOURS Performed at [facility], [address] [place], [place] [address]    Report Status PENDING   Lactic acid, plasma     Status: None   Collection Time: 07/13/23  2:34 PM  Result Value Ref Range   Lactic Acid, Venous 0.7 0.5 - 1.9 mmol/L    Comment: Performed at [facility], [address] [place], [place] [address]  Lactic acid, plasma     Status: None   Collection Time: 07/13/23  5:04 PM  Result Value Ref Range   Lactic Acid, Venous 0.7 0.5 - 1.9 mmol/L    Comment: Performed at [facility],  [address] [place], [place] [address]  HIV Antibody (routine testing w rflx)     Status: None   Collection Time: 07/13/23  5:04 PM  Result Value Ref Range   HIV Screen 4th Generation wRfx Non Reactive Non Reactive    Comment: Performed at [facility], [address], [place], [place] [address]  Basic metabolic panel     Status: Abnormal   Collection Time: 07/14/23  5:10 AM  Result Value Ref Range   Sodium 136 135 - 145 mmol/L   Potassium 3.8 3.5 - 5.1 mmol/L   Chloride 105 98 - 111 mmol/L   CO2 25 22 - 32 mmol/L   Glucose, Bld 120 (H) 70 - 99 mg/dL    Comment: Glucose reference range applies only to samples taken after fasting for at least 8 hours.   BUN 12 8 - 23 mg/dL   Creatinine, Ser 8.75 0.44 - 1.00 mg/dL   Calcium 8.5 (L) 8.9 - 10.3 mg/dL   GFR, Estimated >64 >33 mL/min    Comment: (NOTE) Calculated using the CKD-EPI Creatinine Equation (2021)    Anion gap 6 5 - 15    Comment: Performed at [facility], [address] [place], [place] [address]  CBC     Status: None   Collection Time: 07/14/23  5:10 AM  Result Value Ref Range   WBC 5.8 4.0 - 10.5 K/uL   RBC 3.92 3.87 - 5.11 MIL/uL   Hemoglobin 12.1 12.0 - 15.0 g/dL   HCT 84.1 66.0 -  46.0 %   MCV 93.4 80.0 - 100.0 fL   MCH 30.9 26.0 - 34.0 pg   MCHC 33.1 30.0 - 36.0 g/dL   RDW 87.5 64.3 - 32.9 %   Platelets 237 150 - 400 K/uL   nRBC 0.0 0.0 - 0.2 %    Comment: Performed at [facility], [address] [place], [place] [address]    Current Facility-Administered Medications  Medication Dose Route Frequency Provider Last Rate Last Admin   acetaminophen (TYLENOL) tablet 650 mg  650 mg Oral Q6H PRN Mikey College T, MD   650 mg at 07/14/23 1660   Or   acetaminophen (TYLENOL) suppository 650 mg  650 mg Rectal Q6H PRN Emeline General, MD       butalbital-acetaminophen-caffeine (FIORICET) 50-325-40 MG per tablet 1 tablet  1 tablet Oral Q6H PRN Georgeann Oppenheim, Sudheer B, MD       enoxaparin (LOVENOX)  injection 40 mg  40 mg Subcutaneous Q24H Mikey College T, MD   40 mg at 07/13/23 1533   folic acid (FOLVITE) tablet 1 mg  1 mg Oral Daily Mikey College T, MD   1 mg at 07/14/23 1029   hydrALAZINE (APRESOLINE) injection 5 mg  5 mg Intravenous Q6H PRN Mikey College T, MD       LORazepam (ATIVAN) tablet 1-4 mg  1-4 mg Oral Q1H PRN Emeline General, MD       Or   LORazepam (ATIVAN) injection 1-4 mg  1-4 mg Intravenous Q1H PRN Mikey College T, MD       multivitamin with minerals tablet 1 tablet  1 tablet Oral Daily Mikey College T, MD       ondansetron [facility]) tablet 4 mg  4 mg Oral Q6H PRN Mikey College T, MD       Or   ondansetron [facility]) injection 4 mg  4 mg Intravenous Q6H PRN Mikey College T, MD       thiamine (VITAMIN B1) tablet 100 mg  100 mg Oral Daily Mikey College T, MD       Or   thiamine (VITAMIN B1) injection 100 mg  100 mg Intravenous Daily Mikey College T, MD   100 mg at 07/13/23 1533   Current Outpatient Medications  Medication Sig Dispense Refill   ALPRAZolam (XANAX) 1 MG tablet Take 1 mg by mouth 3 (three) times daily.     buprenorphine-naloxone (SUBOXONE) 8-2 mg SUBL SL tablet Place 1 tablet under the tongue 2 (two) times daily.     Cholecalciferol 50 MCG (2000 UT) CAPS Take 1 tablet by mouth daily.     citalopram (CELEXA) 40 MG tablet Take 40 mg by mouth daily.     cloNIDine (CATAPRES) 0.1 MG tablet Take 0.1 mg by mouth 2 (two) times daily.     dextroamphetamine (DEXTROSTAT) 10 MG tablet Take 15 mg by mouth 2 (two) times daily.     furosemide (LASIX) 20 MG tablet Take 20 mg by mouth daily as needed.     amLODipine (NORVASC) 5 MG tablet Take 5 mg by mouth daily. (Patient not taking: Reported on 07/13/2023)     Multiple Vitamin (MULTIVITAMIN WITH MINERALS) TABS tablet Take 1 tablet by mouth daily. (Patient not taking: Reported on 07/13/2023) 30 tablet 0   thiamine (VITAMIN B-1) 100 MG tablet Take 1 tablet (100 mg total) by mouth daily. (Patient not taking: Reported on 07/13/2023) 30 tablet 0     Musculoskeletal: Strength & Muscle Tone: decreased Gait & Station:  did not witness Patient leans: N/A  Psychiatric Specialty Exam: Physical Exam Vitals and nursing note reviewed.  Constitutional:      Appearance: Normal appearance.  HENT:     Head: Normocephalic.     Nose: Nose normal.  Pulmonary:     Effort: Pulmonary effort is normal.  Musculoskeletal:     Cervical back: Normal range of motion.  Neurological:     General: No focal deficit present.     Mental Status: She is alert and oriented to person, place, and time.     Review of Systems  Psychiatric/Behavioral:  Positive for depression, memory loss and suicidal ideas. The patient is nervous/anxious.   All other systems reviewed and are negative.   Blood pressure (!) 152/67, pulse 72, temperature 97.6 F (36.4 C), temperature source Oral, resp. rate 13, height 5\' 6"  (1.676 m), weight 61.2 kg, SpO2 98%.Body mass index is 21.79 kg/m.  General Appearance: Disheveled  Eye Contact:  Fair  Speech:  Slow  Volume:  Decreased  Mood:  Anxious and Depressed  Affect:  Congruent  Thought Process:  Confused  Orientation:  Other:  person and place  Thought Content:  logical, confused at times  Suicidal Thoughts:  Yes.  with intent/plan  Homicidal Thoughts:  No  Memory:  Immediate;   Poor Recent;   Poor Remote;   Poor  Judgement:  Impaired  Insight:  Lacking  Psychomotor Activity:  Decreased  Concentration:  Concentration: Poor and Attention Span: Poor  Recall:  Poor  Fund of Knowledge:  Poor  Language:  Fair  Akathisia:  No  Handed:  Right  AIMS (if indicated):     Assets:  Housing Leisure Time Resilience Social Support  ADL's:  Intact  Cognition:  Impaired,  Moderate  Sleep:        Physical Exam: Physical Exam Vitals and nursing note reviewed.  Constitutional:      Appearance: Normal appearance.  HENT:     Head: Normocephalic.     Nose: Nose normal.  Pulmonary:     Effort: Pulmonary effort is  normal.  Musculoskeletal:     Cervical back: Normal range of motion.  Neurological:     General: No focal deficit present.     Mental Status: She is alert and oriented to person, place, and time.    Review of Systems  Psychiatric/Behavioral:  Positive for depression, memory loss and suicidal ideas. The patient is nervous/anxious.   All other systems reviewed and are negative.  Blood pressure (!) 152/67, pulse 72, temperature 97.6 F (36.4 C), temperature source Oral, resp. rate 13, height 5\' 6"  (1.676 m), weight 61.2 kg, SpO2 98%. Body mass index is 21.79 kg/m.  Treatment Plan Summary: Daily contact with patient to assess and evaluate symptoms and progress in treatment, Medication management, and Plan : Major depressive disorder, recurrent, severe without psychosis: Admit to gero-psych once medically cleared.  Disposition: Recommend psychiatric Inpatient admission when medically cleared.  Nanine Means, NP 07/14/2023 10:38 AM

## 2023-07-14 NOTE — ED Notes (Signed)
Patient refused lunch

## 2023-07-14 NOTE — ED Notes (Signed)
Family at bedside. 

## 2023-07-14 NOTE — ED Notes (Signed)
Pt rang call bell for bathroom assistance. This tech assisted pt to toilet in room. Pt had BM, and completed self peri care.  Pt very tearful getting back into bed, concerned of boyfriends well being.  Pt is resting in bed, bed is in lowest locked position call bell in reach.

## 2023-07-14 NOTE — ED Notes (Signed)
Patient was yelling out "Help". This tech entered the room and attended to patients request. This tech educated pt on using her call bell instead of yelling. Patient is resting in bed, bed is in lowest and locked position, call bell in reach.

## 2023-07-14 NOTE — Evaluation (Signed)
Physical Therapy Evaluation Patient Details Name: Stephanie Skinner MRN: [id number] DOB: 1956/08/24 Today's Date: 07/14/2023  History of Present Illness  67 y.o. female with medical history significant of benzodiazepine dependence, alcohol dependence, narcotic dependence, on Suboxone, hypertension, presented with drug overdose. CT head negative for acute intracranial abnormalities.   Clinical Impression  Pt alert and oriented, reports headache (RN notified), and is receptive to participating in therapy. Pt lives with her husband (who is on hospice), has 2 STE with B/L handrails, and was independent with all mobility and ADL/IADLs prior to recent hospitalization. Pt mod I with bed mobility, transfers, and stair negotiation with B/L handrail use. Pt required supervision with 200' for amb without AD (see gait details) and would benefit from skilled PT to address dynamic balance impairments. Pt safe from a mobility standpoint to return home, but will pick her up for PT to improve dynamic balance while in the acute care setting. Pt left with sitter in room and all needs met.         If plan is discharge home, recommend the following: A little help with walking and/or transfers;Assist for transportation   Can travel by private vehicle    yes    Equipment Recommendations None recommended by PT  Recommendations for Other Services       Functional Status Assessment Patient has had a recent decline in their functional status and demonstrates the ability to make significant improvements in function in a reasonable and predictable amount of time.     Precautions / Restrictions Precautions Precautions: Fall Restrictions Weight Bearing Restrictions: No      Mobility  Bed Mobility Overal bed mobility: Modified Independent                  Transfers Overall transfer level: Modified independent                 General transfer comment: STS from EOB with mod I- placed hand on table  to steady herself but no LOB observed    Ambulation/Gait Ambulation/Gait assistance: Supervision Gait Distance (Feet): 200 Feet Assistive device: None Gait Pattern/deviations: Antalgic       General Gait Details: L trunk lean with R limp 2/2 scoliosis and prior R hip surgery years ago per pt reports; slightly unsteady but pt reports this is her baseline; not interested in an AD  Stairs Stairs: Yes Stairs assistance: Modified independent (Device/Increase time) Stair Management: Two rails, Alternating pattern, Forwards Number of Stairs: 4 General stair comments: mod I and stable with B/L handrails  Wheelchair Mobility     Tilt Bed    Modified Rankin (Stroke Patients Only)       Balance Overall balance assessment: Needs assistance, History of Falls Sitting-balance support: Feet supported, No upper extremity supported Sitting balance-Leahy Scale: Normal     Standing balance support: No upper extremity supported, During functional activity Standing balance-Leahy Scale: Fair Standing balance comment: mildly unsteady and staggering gait pattern with increased ambulation, no LOB observed                             Pertinent Vitals/Pain Pain Assessment Pain Assessment: No/denies pain Pain Location: headache Pain Intervention(s): Monitored during session    Home Living Family/patient expects to be discharged to:: Private residence Living Arrangements: Spouse/significant other (spouse is on hospice)   Type of Home: House Home Access: Stairs to enter Entrance Stairs-Rails: Right;Left;Can reach both Entrance Stairs-Number of Steps: 2  Home Layout: One level Home Equipment: None      Prior Function Prior Level of Function : Independent/Modified Independent;History of Falls (last six months)             Mobility Comments: no AD use, reports able to drive to grocery store, MD appts, etc. ADLs Comments: IND with ADLs,IADLs; spouse is on hospice      Extremity/Trunk Assessment   Upper Extremity Assessment Upper Extremity Assessment: Defer to OT evaluation    Lower Extremity Assessment Lower Extremity Assessment: Overall WFL for tasks assessed    Cervical / Trunk Assessment Cervical / Trunk Assessment: Normal  Communication   Communication Communication: No apparent difficulties Cueing Techniques: Verbal cues  Cognition Arousal: Alert Behavior During Therapy: WFL for tasks assessed/performed Overall Cognitive Status: Within Functional Limits for tasks assessed                                 General Comments: tearful at times when talking about her husband being on hospice and wanting to go back home        General Comments General comments (skin integrity, edema, etc.): balance appeared intact throughout evaluation with no LOB noted during bed mobility, STS and mobility in the room with no AD use    Exercises     Assessment/Plan    PT Assessment Patient needs continued PT services  PT Problem List Decreased balance;Decreased safety awareness;Decreased activity tolerance       PT Treatment Interventions Balance training;Gait training;Patient/family education;Functional mobility training;DME instruction    PT Goals (Current goals can be found in the Care Plan section)  Acute Rehab PT Goals Patient Stated Goal: to go home PT Goal Formulation: With patient Time For Goal Achievement: 07/28/23 Potential to Achieve Goals: Good    Frequency Min 1X/week     Co-evaluation               AM-PAC PT "6 Clicks" Mobility  Outcome Measure Help needed turning from your back to your side while in a flat bed without using bedrails?: None Help needed moving from lying on your back to sitting on the side of a flat bed without using bedrails?: None Help needed moving to and from a bed to a chair (including a wheelchair)?: None Help needed standing up from a chair using your arms (e.g., wheelchair or  bedside chair)?: None Help needed to walk in hospital room?: A Little Help needed climbing 3-5 steps with a railing? : A Little 6 Click Score: 22    End of Session Equipment Utilized During Treatment: Gait belt Activity Tolerance: Patient tolerated treatment well Patient left: in bed;with nursing/sitter in room;with call bell/phone within reach;with bed alarm set Nurse Communication: Mobility status PT Visit Diagnosis: Unsteadiness on feet (R26.81);Repeated falls (R29.6);History of falling (Z91.81)    Time: 3474-2595 PT Time Calculation (min) (ACUTE ONLY): 16 min   Charges:   PT Evaluation $PT Eval Low Complexity: 1 Low   PT General Charges $$ ACUTE PT VISIT: 1 Visit           Shauna Hugh, SPT 07/14/2023, 3:07 PM

## 2023-07-14 NOTE — ED Notes (Signed)
Assisted the patient to the toilet in the room. Patient tearful and inquiring about the status of her boyfriend.

## 2023-07-15 ENCOUNTER — Inpatient Hospital Stay
Admission: AD | Admit: 2023-07-15 | Discharge: 2023-07-20 | DRG: 881 | Disposition: A | Discharge status: Home / Self Care | Payer: Medicare HMO | Source: Intra-hospital | Attending: Psychiatry | Admitting: Psychiatry

## 2023-07-15 ENCOUNTER — Encounter: Payer: Self-pay | Admitting: Psychiatry

## 2023-07-15 ENCOUNTER — Other Ambulatory Visit: Payer: Self-pay

## 2023-07-15 DIAGNOSIS — I10 Essential (primary) hypertension: Secondary | ICD-10-CM | POA: Diagnosis present

## 2023-07-15 DIAGNOSIS — Z885 Allergy status to narcotic agent status: Secondary | ICD-10-CM

## 2023-07-15 DIAGNOSIS — F332 Major depressive disorder, recurrent severe without psychotic features: Secondary | ICD-10-CM | POA: Diagnosis not present

## 2023-07-15 DIAGNOSIS — Z79899 Other long term (current) drug therapy: Secondary | ICD-10-CM | POA: Diagnosis not present

## 2023-07-15 DIAGNOSIS — Z833 Family history of diabetes mellitus: Secondary | ICD-10-CM | POA: Diagnosis not present

## 2023-07-15 DIAGNOSIS — Z8616 Personal history of COVID-19: Secondary | ICD-10-CM

## 2023-07-15 DIAGNOSIS — F17210 Nicotine dependence, cigarettes, uncomplicated: Secondary | ICD-10-CM | POA: Diagnosis present

## 2023-07-15 DIAGNOSIS — F17290 Nicotine dependence, other tobacco product, uncomplicated: Secondary | ICD-10-CM | POA: Diagnosis present

## 2023-07-15 DIAGNOSIS — F329 Major depressive disorder, single episode, unspecified: Principal | ICD-10-CM | POA: Diagnosis present

## 2023-07-15 DIAGNOSIS — Z634 Disappearance and death of family member: Secondary | ICD-10-CM

## 2023-07-15 DIAGNOSIS — Z882 Allergy status to sulfonamides status: Secondary | ICD-10-CM

## 2023-07-15 DIAGNOSIS — Z800 Family history of malignant neoplasm of digestive organs: Secondary | ICD-10-CM

## 2023-07-15 DIAGNOSIS — F1120 Opioid dependence, uncomplicated: Secondary | ICD-10-CM | POA: Diagnosis present

## 2023-07-15 DIAGNOSIS — Z90711 Acquired absence of uterus with remaining cervical stump: Secondary | ICD-10-CM | POA: Diagnosis not present

## 2023-07-15 DIAGNOSIS — T50902A Poisoning by unspecified drugs, medicaments and biological substances, intentional self-harm, initial encounter: Secondary | ICD-10-CM | POA: Diagnosis not present

## 2023-07-15 LAB — SARS CORONAVIRUS 2 BY RT PCR: SARS Coronavirus 2 by RT PCR: NEGATIVE

## 2023-07-15 MED ORDER — OLANZAPINE 10 MG IM SOLR: 5.0000 mg | Freq: Three times a day (TID) | INTRAMUSCULAR | Status: DC | PRN

## 2023-07-15 MED ORDER — BUTALBITAL-APAP-CAFFEINE 50-325-40 MG PO TABS
1.0000 | ORAL_TABLET | Freq: Four times a day (QID) | ORAL | Status: DC | PRN
  Administered 2023-07-19 – 2023-07-20 (×3): 1 via ORAL
  Filled 2023-07-15 (×3): qty 1

## 2023-07-15 MED ORDER — MAGNESIUM HYDROXIDE 400 MG/5ML PO SUSP: 30.0000 mL | Freq: Every day | ORAL | Status: DC | PRN

## 2023-07-15 MED ORDER — LORAZEPAM 2 MG/ML IJ SOLN: 1.0000 mg | INTRAMUSCULAR | Status: AC | PRN

## 2023-07-15 MED ORDER — BUPRENORPHINE HCL-NALOXONE HCL 8-2 MG SL SUBL
1.0000 | SUBLINGUAL_TABLET | Freq: Two times a day (BID) | SUBLINGUAL | Status: DC
  Administered 2023-07-15 – 2023-07-20 (×10): 1 via SUBLINGUAL
  Filled 2023-07-15 (×10): qty 1

## 2023-07-15 MED ORDER — OLANZAPINE 5 MG PO TBDP: 5.0000 mg | ORAL_TABLET | Freq: Three times a day (TID) | ORAL | Status: DC | PRN

## 2023-07-15 MED ORDER — FOLIC ACID 1 MG PO TABS
1.0000 mg | ORAL_TABLET | Freq: Every day | ORAL | Status: DC
  Administered 2023-07-16 – 2023-07-20 (×5): 1 mg via ORAL
  Filled 2023-07-15 (×5): qty 1

## 2023-07-15 MED ORDER — ONDANSETRON HCL 4 MG PO TABS: 4.0000 mg | ORAL_TABLET | Freq: Four times a day (QID) | ORAL | Status: DC | PRN

## 2023-07-15 MED ORDER — ALPRAZOLAM 0.5 MG PO TABS
0.5000 mg | ORAL_TABLET | Freq: Three times a day (TID) | ORAL | Status: DC
  Administered 2023-07-15 – 2023-07-18 (×9): 0.5 mg via ORAL
  Filled 2023-07-15 (×9): qty 1

## 2023-07-15 MED ORDER — THIAMINE MONONITRATE 100 MG PO TABS
100.0000 mg | ORAL_TABLET | Freq: Every day | ORAL | Status: DC
  Administered 2023-07-16 – 2023-07-20 (×5): 100 mg via ORAL
  Filled 2023-07-15 (×5): qty 1

## 2023-07-15 MED ORDER — LORAZEPAM 1 MG PO TABS: 1.0000 mg | ORAL_TABLET | ORAL | Status: AC | PRN

## 2023-07-15 MED ORDER — ALUM & MAG HYDROXIDE-SIMETH 200-200-20 MG/5ML PO SUSP: 30.0000 mL | ORAL | Status: DC | PRN

## 2023-07-15 MED ORDER — ADULT MULTIVITAMIN W/MINERALS CH
1.0000 | ORAL_TABLET | Freq: Every day | ORAL | Status: DC
Start: 2023-07-16 — End: 2023-07-20
  Administered 2023-07-16 – 2023-07-20 (×5): 1 via ORAL
  Filled 2023-07-15 (×5): qty 1

## 2023-07-15 MED ORDER — ONDANSETRON HCL 4 MG/2ML IJ SOLN
4.0000 mg | Freq: Four times a day (QID) | INTRAMUSCULAR | Status: DC | PRN
Start: 2023-07-15 — End: 2023-07-17

## 2023-07-15 MED ORDER — CITALOPRAM HYDROBROMIDE 20 MG PO TABS
40.0000 mg | ORAL_TABLET | Freq: Every day | ORAL | Status: DC
  Administered 2023-07-16 – 2023-07-20 (×5): 40 mg via ORAL
  Filled 2023-07-15 (×5): qty 2

## 2023-07-15 MED ORDER — THIAMINE HCL 100 MG/ML IJ SOLN: 100.0000 mg | Freq: Every day | INTRAMUSCULAR | Status: DC

## 2023-07-15 MED ORDER — CLONIDINE HCL 0.1 MG PO TABS
0.1000 mg | ORAL_TABLET | Freq: Two times a day (BID) | ORAL | Status: DC
  Administered 2023-07-15 – 2023-07-17 (×4): 0.1 mg via ORAL
  Filled 2023-07-15 (×4): qty 1

## 2023-07-15 MED ORDER — ACETAMINOPHEN 325 MG PO TABS
650.0000 mg | ORAL_TABLET | Freq: Four times a day (QID) | ORAL | Status: DC | PRN
  Administered 2023-07-16 – 2023-07-20 (×5): 650 mg via ORAL
  Filled 2023-07-15 (×5): qty 2

## 2023-07-15 NOTE — Tx Team (Signed)
Initial Treatment Plan 07/15/2023 1:13 PM Stephanie Skinner UEA:[id number]    PATIENT STRESSORS: Substance abuse   Husband has been admitted to hospice   PATIENT STRENGTHS: Communication skills  Supportive family/friends    PATIENT IDENTIFIED PROBLEMS:   Everyone thinks that I overdosed to kill myself but I did not.                   DISCHARGE CRITERIA:  Ability to meet basic life and health needs Adequate post-discharge living arrangements Improved stabilization in mood, thinking, and/or behavior Safe-care adequate arrangements made Verbal commitment to aftercare and medication compliance  PRELIMINARY DISCHARGE PLAN: Attend aftercare/continuing care group Return to previous living arrangement  PATIENT/FAMILY INVOLVEMENT: This treatment plan has been presented to and reviewed with the patient, Stephanie Skinner. The patient has been given the opportunity to ask questions and make suggestions.   Luane School, RN 07/15/2023, 1:13 PM

## 2023-07-15 NOTE — Plan of Care (Signed)
  Problem: Education: Goal: Knowledge of General Education information will improve Description: Including pain rating scale, medication(s)/side effects and non-pharmacologic comfort measures Outcome: Not Progressing   Problem: Health Behavior/Discharge Planning: Goal: Ability to manage health-related needs will improve Outcome: Not Progressing   Problem: Clinical Measurements: Goal: Ability to maintain clinical measurements within normal limits will improve Outcome: Not Progressing   Problem: Nutrition: Goal: Adequate nutrition will be maintained Outcome: Not Progressing   Problem: Coping: Goal: Level of anxiety will decrease Outcome: Not Progressing   Problem: Pain Management: Goal: General experience of comfort will improve Outcome: Not Progressing

## 2023-07-15 NOTE — Progress Notes (Signed)
Patient is a 67 year old female admitted involuntarily to the [facility] Psych floor from [facility] 1-A unit after an overdose on Flexeril and Clonidine. Patient states repeatedly that she did not purposely overdose to kill herself. "I just wanted the pain to go away." Patient admitted approx. 1230. Patient presents to assessment via wheelchair but is ambulatory. She is A+O x 4. She currently denies SI/HI/AVH. She does agree to contract for safety on the unit. Patient's affect is sad and speech is logical and coherent. Patient endorses depression and anxiety 10/10. She states that her main stressor is the fact that her husband was admitted to hospice yesterday. She currently denies pain. Patient denies the use of a mobility aid at home. Denies the use of reading glasses. Reports last BM yesterday Nov 19.  Patient reports smoking about 1 pack of cigs daily and drinking alcohol sporadically. She denies substance use although her chart says otherwise. Patient says that her support system is her good friend and the hospice team. Her goal while she is here is "to get out of this place."   Skin assessment and body search completed with Elexis, MHT. Skin: warm/dry. Bruising on back especially lower left, L buttock, L arm, R forehead, and scabs on hands.   Emotional support and reassurance provided throughout admission intake. Consents signed. Afterwards, oriented patient to unit, room and call light, reviewed POC with all questions answered and concerns voiced. Patient verbalized understanding. Denies any needs at this time.  Will continue to monitor with ongoing Q 15 minute safety checks.

## 2023-07-15 NOTE — Progress Notes (Signed)
Patient accepted nicotine education but refused any/all replacements.

## 2023-07-15 NOTE — Consult Note (Signed)
Pt seen at bedside. Reviewed with pt IVC status and that she has been accepted to [facility] geropsychiatry unit. She verbalized understanding of plan for transfer for inpatient psychiatric hospitalization. She reports she did not overdose on medications in order to kill herself but because she "didn't want to hurt". Reports primary stressor is that her husband of 21 years was transferred to hospice yesterday. Pt is tearful during assessment. When asked whether she was aware overdoing on medications could kill her, she minimizes overdose. She denies suicidal, homicidal ideations. She denies auditory visual hallucinations or paranoia. Objectively, there is no evidence of agitation, aggression, distractibility or internal preoccupation. No paranoia or delusions elicited.

## 2023-07-15 NOTE — BH Assessment (Signed)
Patient is to be admitted to [facility] Psych Unit by Dr.  Marlou Porch .  Attending Physician will be Dr. Marlou Porch.   Patient has been assigned to room L37, by [facility] Charge Nurse Whitney.    ER staff is aware of the admission:    Juliette Alcide, Patient Access.

## 2023-07-15 NOTE — Plan of Care (Signed)

## 2023-07-15 NOTE — Progress Notes (Signed)
   07/15/23 1400  Spiritual Encounters  Type of Visit Initial  Care provided to: Patient  Conversation partners present during encounter Nurse  Referral source Chaplain assessment  Reason for visit Routine spiritual support  OnCall Visit No  Spiritual Framework  Presenting Themes Impactful experiences and emotions;Courage hope and growth;Meaning/purpose/sources of inspiration  Community/Connection Friend(s);Significant other  Patient Stress Factors Loss  Family Stress Factors Family relationships  Interventions  Spiritual Care Interventions Made Established relationship of care and support;Compassionate presence;Reflective listening;Encouragement;Self-care teaching  Intervention Outcomes  Outcomes Connection to spiritual care;Awareness around self/spiritual resourses;Reduced anxiety;Awareness of support  Spiritual Care Plan  Spiritual Care Issues Still Outstanding Chaplain will continue to follow   Chaplain was on rounds and went to meet with the patient. Patient shared with the chaplain that she is experiencing a lot of grief because her partner is on hospice and she knows that he will die soon. Patient also shared with me that she has not talked with her daughter in over a year. Patient talked to the chaplain about heaven and all of the grief that she has experienced. Chaplain listened and offered her words of hope and comfort. Chaplain will continue to follow up with the patient.

## 2023-07-15 NOTE — Group Note (Signed)
Date:  07/15/2023 Time:  11:47 AM  Group Topic/Focus:  Daily goal setting/Crossword puzzle/Soothing music The purpose of this group is for patients to plan their daily goal and how will they accomplish their goal and who can they ask for help outside of the hospital. Also doing a hygiene crossword puzzle activity while listening to soothing music.    Participation Level:  Did Not Attend  Participation Quality:    Affect:    Cognitive:    Insight:   Engagement in Group:    Modes of Intervention:    Additional Comments:  Patient not on unit yet   Elian Gloster T Noe Gens 07/15/2023, 11:47 AM

## 2023-07-15 NOTE — Discharge Summary (Signed)
Physician Discharge Summary  Stephanie Skinner ZOX:[id number] DOB: [birth year] DOA: 07/13/2023  PCP: Charlies Constable, MD  Admit date: 07/13/2023 Discharge date: 07/15/2023  Admitted From: Home Disposition:  Inpatient BHU  Recommendations for Outpatient Follow-up:  Follow up with PCP in 1-2 weeks   Home Health:No Equipment/Devices:None   Discharge Condition:Stable  CODE STATUS:FULL  Diet recommendation: Reg  Brief/Interim Summary:  67 y.o. female with medical history significant of benzodiazepine dependence, alcohol dependence, narcotic dependence, on Suboxone, hypertension, presented with drug overdose   Patient is extremely lethargic and only mumbling when answering my questions.  All history given by daughter over the phone.  Patient did show up in the ED saying that she was overdosed on Xanax and Suboxone.  When patient was found to be hypothermic and shaking and confused.  Blood pressure stable and very lethargic.  Daughter reported that the patient might have threatened to overdose on Xanax and Suboxone and Atarax for 2 to 3 days.   Seen by psychiatry.  Accepted to inpatient gero-psych unit.    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 -Hypothermia resolved Plan: Continue home benzodiazepine and Suboxone regimen on dc Medically stable for discharge Will go to inpatient gero-psych unit     Acute metabolic encephalopathy, resolved -Sufficiently protecting airway -Secondary to overdose from benzo and narcotics -CT head reassuring   Benzodiazepine overdose -Restart home  Xanax   Probable suicidal attempt -Daughter also suspect patient may have suicidal attempt as she has threatened family about planning to end her life several times since last year. Plan: -1:1 -IVC -Psychiatry will admit to geropsych when bed available -Patient is medically cleared for discharge to the inpatient psychiatric unit at this time -Accepted to inpatient gero-psych      HTN -Restart home clonidine   Suboxone therapy -Restart Suboxone   Discharge Instructions  Discharge Instructions     Diet - low sodium heart healthy   Complete by: As directed    Increase activity slowly   Complete by: As directed       Allergies as of 07/15/2023       Reactions   Codeine Nausea And Vomiting   Morphine And Codeine Other (See Comments)   Seizure   Sulfa Antibiotics Nausea And Vomiting        Medication List     STOP taking these medications    amLODipine 5 MG tablet Commonly known as: NORVASC   dextroamphetamine 10 MG tablet Commonly known as: DEXTROSTAT       TAKE these medications    ALPRAZolam 1 MG tablet Commonly known as: XANAX Take 1 mg by mouth 3 (three) times daily.   buprenorphine-naloxone 8-2 mg Subl SL tablet Commonly known as: SUBOXONE Place 1 tablet under the tongue 2 (two) times daily.   Cholecalciferol 50 MCG (2000 UT) Caps Take 1 tablet by mouth daily.   citalopram 40 MG tablet Commonly known as: CELEXA Take 40 mg by mouth daily.   cloNIDine 0.1 MG tablet Commonly known as: CATAPRES Take 0.1 mg by mouth 2 (two) times daily.   furosemide 20 MG tablet Commonly known as: LASIX Take 20 mg by mouth  daily as needed.   multivitamin with minerals Tabs tablet Take 1 tablet by mouth daily.   thiamine 100 MG tablet Commonly known as: Vitamin B-1 Take 1 tablet (100 mg total) by mouth daily.        Allergies  Allergen Reactions   Codeine Nausea And Vomiting   Morphine And Codeine Other (See Comments)    Seizure    Sulfa  Antibiotics Nausea And Vomiting    Consultations: Psychiatry   Procedures/Studies: CT HEAD WO CONTRAST ( )  Result Date: 07/13/2023 CLINICAL DATA:  Mental status change, unknown cause. EXAM: CT HEAD WITHOUT CONTRAST TECHNIQUE: Contiguous axial images were obtained from the base of the skull through the vertex without intravenous contrast. RADIATION DOSE REDUCTION: This exam was performed according to the departmental dose-optimization program which includes automated exposure control, adjustment of the mA and/or kV according to patient size and/or use of iterative reconstruction technique. COMPARISON:  Head CT 03/05/2023 FINDINGS: Brain: There is no evidence of an acute infarct, intracranial hemorrhage, mass, midline shift, or extra-axial fluid collection. There is mild cerebral atrophy. Periventricular white matter hypodensities are unchanged and nonspecific but compatible with mild chronic small vessel ischemic disease. Vascular: Calcified atherosclerosis at the skull base. No hyperdense vessel. Skull: Remote maxillofacial fractures. Sinuses/Orbits: Left cataract extraction. The included paranasal sinuses and mastoid air cells are clear. Other: None. IMPRESSION: 1. No evidence of acute intracranial abnormality. 2. Mild chronic small vessel ischemic disease. Electronically Signed   By: Sebastian Ache M.D.   On: 07/13/2023 16:38   DG Chest Port 1 View  Result Date: 07/13/2023 CLINICAL DATA:  67 year old female history of drug overdose. EXAM: PORTABLE CHEST 1 VIEW COMPARISON:  Chest x-ray 03/05/2023. FINDINGS: Multiple rib fractures bilaterally, including several acute left-sided rib fractures, similar to the prior study. Lung volumes are low. Bibasilar opacities which may reflect areas of atelectasis and/or consolidation, increased compared to the prior study. Small bilateral pleural effusions. No pneumothorax. No evidence of pulmonary edema. Heart size is normal. Upper mediastinal contours are  distorted by patient's rotation to the left. Atherosclerotic calcifications are noted in the thoracic aorta. IMPRESSION: 1. Low lung volumes with bibasilar areas of atelectasis and/or consolidation and small bilateral pleural effusions. 2. Multiple bilateral rib fractures redemonstrated, including several left-sided rib fractures which appear likely acute or late subacute. No pneumothorax noted at this time. Electronically Signed   By: Trudie Reed M.D.   On: 07/13/2023 13:14      Subjective: Seen and examined on day of DC.  Tearful, not wishing to go to inpatient psychiatric service  Discharge Exam: Vitals:   07/14/23 2005 07/15/23 0749  BP: (!) 106/51 (!) 148/62  Pulse: 78 73  Resp: 19 19  Temp: 98.6 F (37 C) 98.1 F (36.7 C)  SpO2: 96% 94%   Vitals:   07/14/23 1623 07/14/23 2003 07/14/23 2005 07/15/23 0749  BP: 121/65 (!) 106/51 (!) 106/51 (!) 148/62  Pulse: 64 78 78 73  Resp: 18  19 19   Temp: 97.6 F (36.4 C)  98.6 F (37 C) 98.1 F (36.7 C)  TempSrc:   Oral   SpO2: 95%  96% 94%  Weight:      Height:        General: Pt is alert, awake, not in acute distress Cardiovascular: RRR, S1/S2 +, no rubs, no gallops Respiratory: CTA bilaterally, no wheezing, no rhonchi Abdominal: Soft, NT, ND, bowel sounds + Extremities: no edema, no cyanosis    The results of significant diagnostics from this  hospitalization (including imaging, microbiology, ancillary and laboratory) are listed below for reference.     Microbiology: Recent Results (from the past 240 hour(s))  Blood culture (routine x 2)     Status: None (Preliminary result)   Collection Time: 07/13/23  2:34 PM   Specimen: BLOOD  Result Value Ref Range Status   Specimen Description BLOOD BLOOD RIGHT ARM  Final   Special Requests   Final    BOTTLES DRAWN AEROBIC AND ANAEROBIC Blood Culture adequate volume   Culture   Final    NO GROWTH 2 DAYS Performed at [facility], [address], [place],  [place] [address]    Report Status PENDING  Incomplete  Blood culture (routine x 2)     Status: None (Preliminary result)   Collection Time: 07/13/23  2:34 PM   Specimen: BLOOD  Result Value Ref Range Status   Specimen Description BLOOD BLOOD LEFT ARM  Final   Special Requests   Final    BOTTLES DRAWN AEROBIC AND ANAEROBIC Blood Culture adequate volume   Culture   Final    NO GROWTH 2 DAYS Performed at [facility], [address] [place], [place] [address]    Report Status PENDING  Incomplete     Labs: BNP (last 3 results) No results for input(s): "BNP" in the last 8760 hours. Basic Metabolic Panel: Recent Labs  Lab 07/13/23 1232 07/14/23 0510  NA 136 136  K 4.6 3.8  CL 102 105  CO2 25 25  GLUCOSE 123* 120*  BUN 12 12  CREATININE 0.65 0.65  CALCIUM 9.1 8.5*   Liver Function Tests: Recent Labs  Lab 07/13/23 1232  AST 29  ALT 25  ALKPHOS 95  BILITOT 0.7  PROT 7.3  ALBUMIN 3.8   No results for input(s): "LIPASE", "AMYLASE" in the last 168 hours. No results for input(s): "AMMONIA" in the last 168 hours. CBC: Recent Labs  Lab 07/13/23 1300 07/14/23 0510  WBC 7.3 5.8  HGB 12.4 12.1  HCT 37.4 36.6  MCV 95.9 93.4  PLT 222 237   Cardiac Enzymes: No results for input(s): "CKTOTAL", "CKMB", "CKMBINDEX", "TROPONINI" in the last 168 hours. BNP: Invalid input(s): "POCBNP" CBG: Recent Labs  Lab 07/14/23 1624  GLUCAP 256*   D-Dimer No results for input(s): "DDIMER" in the last 72 hours. Hgb A1c No results for input(s): "HGBA1C" in the last 72 hours. Lipid Profile No results for input(s): "CHOL", "HDL", "LDLCALC", "TRIG", "CHOLHDL", "LDLDIRECT" in the last 72 hours. Thyroid function studies No results for input(s): "TSH", "T4TOTAL", "T3FREE", "THYROIDAB" in the last 72 hours.  Invalid input(s): "FREET3" Anemia work up No results for input(s): "VITAMINB12", "FOLATE", "FERRITIN", "TIBC", "IRON", "RETICCTPCT" in the last 72 hours. Urinalysis     Component Value Date/Time   COLORURINE AMBER (A) 05/23/2022 1400   APPEARANCEUR HAZY (A) 05/23/2022 1400   LABSPEC 1.016 05/23/2022 1400   PHURINE 6.0 05/23/2022 1400   GLUCOSEU NEGATIVE 05/23/2022 1400   HGBUR NEGATIVE 05/23/2022 1400   BILIRUBINUR NEGATIVE 05/23/2022 1400   KETONESUR 20 (A) 05/23/2022 1400   PROTEINUR NEGATIVE 05/23/2022 1400   NITRITE NEGATIVE 05/23/2022 1400   LEUKOCYTESUR NEGATIVE 05/23/2022 1400   Sepsis Labs Recent Labs  Lab 07/13/23 1300 07/14/23 0510  WBC 7.3 5.8   Microbiology Recent Results (from the past 240 hour(s))  Blood culture (routine x 2)     Status: None (Preliminary result)   Collection Time: 07/13/23  2:34 PM   Specimen: BLOOD  Result Value Ref  Range Status   Specimen Description BLOOD BLOOD RIGHT ARM  Final   Special Requests   Final    BOTTLES DRAWN AEROBIC AND ANAEROBIC Blood Culture adequate volume   Culture   Final    NO GROWTH 2 DAYS Performed at [facility], [address] [place], [place] [address]    Report Status PENDING  Incomplete  Blood culture (routine x 2)     Status: None (Preliminary result)   Collection Time: 07/13/23  2:34 PM   Specimen: BLOOD  Result Value Ref Range Status   Specimen Description BLOOD BLOOD LEFT ARM  Final   Special Requests   Final    BOTTLES DRAWN AEROBIC AND ANAEROBIC Blood Culture adequate volume   Culture   Final    NO GROWTH 2 DAYS Performed at [facility], [address] [place], [place] [address]    Report Status PENDING  Incomplete     Time coordinating discharge: Over 30 minutes  SIGNED:   Tresa Moore, MD  [facility] 07/15/2023, 10:47 AM Pager   If 7PM-7AM, please contact night-coverage

## 2023-07-15 NOTE — Group Note (Signed)
Recreation Therapy Group Note   Group Topic:Goal Setting  Group Date: 07/15/2023 Start Time: 1400 End Time: 1500 Facilitators: Rosina Lowenstein, LRT, CTRS Location:  Day Room  Group Description: Vision Boards. Patients were given many different magazines, a glue stick, markers, and a piece of cardstock paper. LRT and pts discussed the importance of having goals in life. LRT and pts discussed the difference between short-term and long-term goals, as well as what a SMART goal is. LRT encouraged pts to create a vision board, with images they picked and then cut out with safety scissors from the magazine, for themselves, that capture their short and long-term goals. LRT encouraged pts to show and explain their vision board to the group.   Goal Area(s) Addressed:  Patient will gain knowledge of short vs. long term goals.  Patient will identify goals for themselves. Patient will practice setting SMART goals. Patient will verbalize their goals to LRT and peers.   Affect/Mood: N/A   Participation Level: Did not attend    Clinical Observations/Individualized Feedback: Ellisa did not attend group.   Plan: Continue to engage patient in RT group sessions 2-3x/week.   Rosina Lowenstein, LRT, CTRS 07/15/2023 3:50 PM

## 2023-07-16 DIAGNOSIS — F332 Major depressive disorder, recurrent severe without psychotic features: Secondary | ICD-10-CM

## 2023-07-16 MED ORDER — OLANZAPINE 5 MG PO TABS
10.0000 mg | ORAL_TABLET | Freq: Every day | ORAL | Status: DC
  Administered 2023-07-17 – 2023-07-19 (×3): 10 mg via ORAL
  Filled 2023-07-16 (×5): qty 2

## 2023-07-16 MED ORDER — TRAZODONE HCL 50 MG PO TABS
50.0000 mg | ORAL_TABLET | Freq: Every evening | ORAL | Status: DC | PRN
  Administered 2023-07-16 – 2023-07-19 (×4): 50 mg via ORAL
  Filled 2023-07-16 (×4): qty 1

## 2023-07-16 NOTE — BH IP Treatment Plan (Signed)
Interdisciplinary Treatment and Diagnostic Plan Update  07/16/2023 Time of Session: 9:30 AM  Stephanie Skinner MRN: [id number]  Principal Diagnosis: MDD (major depressive disorder)  Secondary Diagnoses: Principal Problem:   MDD (major depressive disorder)   Current Medications:  Current Facility-Administered Medications  Medication Dose Route Frequency Provider Last Rate Last Admin   acetaminophen (TYLENOL) tablet 650 mg  650 mg Oral Q6H PRN Lauree Chandler, NP   650 mg at 07/16/23 0753   ALPRAZolam Prudy Feeler) tablet 0.5 mg  0.5 mg Oral TID Lauree Chandler, NP   0.5 mg at 07/16/23 1555   alum & mag hydroxide-simeth (MAALOX/MYLANTA) 200-200-20 MG/5ML suspension 30 mL  30 mL Oral Q4H PRN Lauree Chandler, NP       buprenorphine-naloxone (SUBOXONE) 8-2 mg per SL tablet 1 tablet  1 tablet Sublingual BID Lauree Chandler, NP   1 tablet at 07/16/23 1006   butalbital-acetaminophen-caffeine (FIORICET) 50-325-40 MG per tablet 1 tablet  1 tablet Oral Q6H PRN Lauree Chandler, NP       citalopram (CELEXA) tablet 40 mg  40 mg Oral Daily Lauree Chandler, NP   40 mg at 07/16/23 1004   cloNIDine (CATAPRES) tablet 0.1 mg  0.1 mg Oral BID Lauree Chandler, NP   0.1 mg at 07/16/23 1005   folic acid (FOLVITE) tablet 1 mg  1 mg Oral Daily Lauree Chandler, NP   1 mg at 07/16/23 1004   magnesium hydroxide (MILK OF MAGNESIA) suspension 30 mL  30 mL Oral Daily PRN Lauree Chandler, NP       multivitamin with minerals tablet 1 tablet  1 tablet Oral Daily Lauree Chandler, NP   1 tablet at 07/16/23 1004   OLANZapine (ZYPREXA) injection 5 mg  5 mg Intramuscular TID PRN Lauree Chandler, NP       OLANZapine (ZYPREXA) tablet 10 mg  10 mg Oral QHS Herrick, Richard Edward, DO       OLANZapine zydis (ZYPREXA) disintegrating tablet 5 mg  5 mg Oral TID PRN Lauree Chandler, NP       ondansetron [facility]) tablet 4 mg  4 mg Oral Q6H PRN Lauree Chandler, NP       Or   ondansetron  [facility]) injection 4 mg  4 mg Intravenous Q6H PRN Lauree Chandler, NP       thiamine (VITAMIN B1) tablet 100 mg  100 mg Oral Daily Lauree Chandler, NP   100 mg at 07/16/23 1004   Or   thiamine (VITAMIN B1) injection 100 mg  100 mg Intravenous Daily Lauree Chandler, NP       traZODone (DESYREL) tablet 50 mg  50 mg Oral QHS PRN Sarina Ill, DO       PTA Medications: Medications Prior to Admission  Medication Sig Dispense Refill Last Dose   ALPRAZolam (XANAX) 1 MG tablet Take 1 mg by mouth 3 (three) times daily.      buprenorphine-naloxone (SUBOXONE) 8-2 mg SUBL SL tablet Place 1 tablet under the tongue 2 (two) times daily.      Cholecalciferol 50 MCG (2000 UT) CAPS Take 1 tablet by mouth daily.      citalopram (CELEXA) 40 MG tablet Take 40 mg by mouth daily.      cloNIDine (CATAPRES) 0.1 MG tablet Take 0.1 mg by mouth 2 (two) times daily.      furosemide (LASIX) 20 MG tablet Take 20 mg by mouth daily as needed.  Multiple Vitamin (MULTIVITAMIN WITH MINERALS) TABS tablet Take 1 tablet by mouth daily. (Patient not taking: Reported on 07/13/2023) 30 tablet 0    thiamine (VITAMIN B-1) 100 MG tablet Take 1 tablet (100 mg total) by mouth daily. (Patient not taking: Reported on 07/13/2023) 30 tablet 0     Patient Stressors: Substance abuse    Patient Strengths: Manufacturing systems engineer  Supportive family/friends   Treatment Modalities: Medication Management, Group therapy, Case management,  1 to 1 session with clinician, Psychoeducation, Recreational therapy.   Physician Treatment Plan for Primary Diagnosis: MDD (major depressive disorder) Long Term Goal(s): Improvement in symptoms so as ready for discharge   Short Term Goals: Ability to identify changes in lifestyle to reduce recurrence of condition will improve Ability to verbalize feelings will improve Ability to disclose and discuss suicidal ideas Ability to demonstrate self-control will improve Ability to  identify and develop effective coping behaviors will improve Ability to maintain clinical measurements within normal limits will improve Compliance with prescribed medications will improve Ability to identify triggers associated with substance abuse/mental health issues will improve  Medication Management: Evaluate patient's response, side effects, and tolerance of medication regimen.  Therapeutic Interventions: 1 to 1 sessions, Unit Group sessions and Medication administration.  Evaluation of Outcomes: Not Progressing  Physician Treatment Plan for Secondary Diagnosis: Principal Problem:   MDD (major depressive disorder)  Long Term Goal(s): Improvement in symptoms so as ready for discharge   Short Term Goals: Ability to identify changes in lifestyle to reduce recurrence of condition will improve Ability to verbalize feelings will improve Ability to disclose and discuss suicidal ideas Ability to demonstrate self-control will improve Ability to identify and develop effective coping behaviors will improve Ability to maintain clinical measurements within normal limits will improve Compliance with prescribed medications will improve Ability to identify triggers associated with substance abuse/mental health issues will improve     Medication Management: Evaluate patient's response, side effects, and tolerance of medication regimen.  Therapeutic Interventions: 1 to 1 sessions, Unit Group sessions and Medication administration.  Evaluation of Outcomes: Not Progressing   RN Treatment Plan for Primary Diagnosis: MDD (major depressive disorder) Long Term Goal(s): Knowledge of disease and therapeutic regimen to maintain health will improve  Short Term Goals: Ability to remain free from injury will improve, Ability to verbalize frustration and anger appropriately will improve, Ability to demonstrate self-control, Ability to participate in decision making will improve, Ability to verbalize  feelings will improve, Ability to disclose and discuss suicidal ideas, Ability to identify and develop effective coping behaviors will improve, and Compliance with prescribed medications will improve  Medication Management: RN will administer medications as ordered by provider, will assess and evaluate patient's response and provide education to patient for prescribed medication. RN will report any adverse and/or side effects to prescribing provider.  Therapeutic Interventions: 1 on 1 counseling sessions, Psychoeducation, Medication administration, Evaluate responses to treatment, Monitor vital signs and CBGs as ordered, Perform/monitor CIWA, COWS, AIMS and Fall Risk screenings as ordered, Perform wound care treatments as ordered.  Evaluation of Outcomes: Progressing   LCSW Treatment Plan for Primary Diagnosis: MDD (major depressive disorder) Long Term Goal(s): Safe transition to appropriate next level of care at discharge, Engage patient in therapeutic group addressing interpersonal concerns.  Short Term Goals: Engage patient in aftercare planning with referrals and resources, Increase social support, Increase ability to appropriately verbalize feelings, Increase emotional regulation, Facilitate acceptance of mental health diagnosis and concerns, Facilitate patient progression through stages of change regarding substance use  diagnoses and concerns, Identify triggers associated with mental health/substance abuse issues, and Increase skills for wellness and recovery  Therapeutic Interventions: Assess for all discharge needs, 1 to 1 time with Social worker, Explore available resources and support systems, Assess for adequacy in community support network, Educate family and significant other(s) on suicide prevention, Complete Psychosocial Assessment, Interpersonal group therapy.  Evaluation of Outcomes: Progressing   Progress in Treatment: Attending groups: Yes. Participating in groups:  Yes. Taking medication as prescribed: Yes. Toleration medication: Yes. Family/Significant other contact made: No, will contact:  CSW will contact if given permission  Patient understands diagnosis: Yes. Discussing patient identified problems/goals with staff: Yes. Medical problems stabilized or resolved: Yes. Denies suicidal/homicidal ideation: Yes. Issues/concerns per patient self-inventory: No. Other: None   New problem(s) identified: No, Describe:  None identified   New Short Term/Long Term Goal(s):  elimination of symptoms of psychosis, medication management for mood stabilization; elimination of SI thoughts; development of comprehensive mental wellness/ plan.   Patient Goals:  " There is no goal for me to be here"  Discharge Plan or Barriers: CSW will assist with appropriate discharge planning   Reason for Continuation of Hospitalization: Depression Medication stabilization  Estimated Length of Stay: 1 to 7 days  Last 3 [place] Suicide Severity Risk Score: Flowsheet Row Admission (Current) from 07/15/2023 in [facility] ED to Hosp-Admission (Discharged) from 07/13/2023 in [facility] (1[facility] ED from 04/26/2023 in [facility] Emergency Department at [facility]  C-SSRS RISK CATEGORY No Risk High Risk No Risk       Last PHQ 2/9 Scores:     No data to display          Scribe for Treatment Team: Elza Rafter, Theresia Majors 07/16/2023 4:15 PM

## 2023-07-16 NOTE — Group Note (Signed)
Recreation Therapy Group Note   Group Topic:Health and Wellness  Group Date: 07/16/2023 Start Time: 1400 End Time: 1450 Facilitators: Rosina Lowenstein, LRT, CTRS Location:  Day Room  Group Description: Seated Exercise. LRT discussed the mental and physical benefits of exercise. LRT and group discussed how physical activity can be used as a coping skill. Pt's and LRT followed along to an exercise video on the TV screen that provided a visual representation and audio description of every exercise performed. Pt's encouraged to listen to their bodies and stop at any time if they experience feelings of discomfort or pain. Pts were encouraged to drink water and stay hydrated.   Goal Area(s) Addressed: Patient will learn benefits of physical activity. Patient will identify exercise as a coping skill.  Patient will follow multistep directions. Patient will try a new leisure interest.    Affect/Mood: Appropriate and Full range   Participation Level: Minimal   Participation Quality: Independent   Behavior: Alert and Cooperative   Speech/Thought Process: Coherent   Insight: Fair   Judgement: Fair    Modes of Intervention: Activity, Education, and Exploration   Patient Response to Interventions:  Receptive   Education Outcome:  In group clarification offered    Clinical Observations/Individualized Feedback: Volanda was minimally active in their participation of session activities and group discussion. Pt was tearful at times sharing that her husband passed and she needed to leave to make funeral arrangements to laughing and smiling with peers. Pt did not compete most exercises shown. Pt was pleasant when interacting with LRT and peers while in group.    Plan: Continue to engage patient in RT group sessions 2-3x/week.   Rosina Lowenstein, LRT, CTRS 07/16/2023 3:02 PM

## 2023-07-16 NOTE — H&P (Signed)
Psychiatric Admission Assessment Adult  Patient Identification: Stephanie Skinner MRN:  [id number] Date of Evaluation:  07/16/2023 Chief Complaint:  MDD (major depressive disorder) [F32.9] Principal Diagnosis: MDD (major depressive disorder) Diagnosis:  Principal Problem:   MDD (major depressive disorder)  History of Present Illness: Stephanie Skinner is a 67 year old white female who overtook her medications which included Suboxone and Xanax.  She says it was not an over dose to kill herself but she was feeling depressed because her husband was dying and he did diet last night.  He was in hospice.  His extended family was concerned and she was brought to the emergency room where she was involuntarily admitted to psychiatry.  She was prescribed Suboxone and Xanax on 07/04/2023.  She does see Dr. Fannie Knee in [place] at [facility].  She does have a long history of depression.  She is on Celexa, Xanax, Suboxone for the past 2 years.  She does not think that she needs to be here but she obviously has exhibited poor insight.  Also, impulsivity.  Associated Signs/Symptoms: Depression Symptoms:  depressed mood, anhedonia, insomnia, (Hypo) Manic Symptoms:  Impulsivity, Anxiety Symptoms:  Excessive Worry, Psychotic Symptoms:   None PTSD Symptoms: NA Total Time spent with patient: 1 hour  Past Psychiatric History: She has 1 previous psychiatric hospitalization in [place] about 5 years ago.  She sees Dr. Fannie Knee in [place] at [facility].  Is the patient at risk to self? Yes.    Has the patient been a risk to self in the past 6 months? Yes.    Has the patient been a risk to self within the distant past? Yes.    Is the patient a risk to others? No.  Has the patient been a risk to others in the past 6 months? No.  Has the patient been a risk to others within the distant past? No.   [place] Scale:  Flowsheet Row Admission (Current) from 07/15/2023 in [facility] BEHAVIORAL  MEDICINE ED to Hosp-Admission (Discharged) from 07/13/2023 in [facility] (1A) ED from 04/26/2023 in [facility] Emergency Department at [facility]  C-SSRS RISK CATEGORY No Risk High Risk No Risk        Prior Inpatient Therapy: Yes.   If yes, describe as above Prior Outpatient Therapy: Yes.   If yes, describe as above  Alcohol Screening: 1. How often do you have a drink containing alcohol?: Monthly or less 2. How many drinks containing alcohol do you have on a typical day when you are drinking?: 1 or 2 3. How often do you have six or more drinks on one occasion?: Never AUDIT-C Score: 1 4. How often during the last year have you found that you were not able to stop drinking once you had started?: Never 5. How often during the last year have you failed to do what was normally expected from you because of drinking?: Never 6. How often during the last year have you needed a first drink in the morning to get yourself going after a heavy drinking session?: Never 7. How often during the last year have you had a feeling of guilt of remorse after drinking?: Never 8. How often during the last year have you been unable to remember what happened the night before because you had been drinking?: Never 9. Have you or someone else been injured as a result of your drinking?: No 10. Has a relative or friend or a doctor or another health worker been  concerned about your drinking or suggested you cut down?: No Alcohol Use Disorder Identification Test Final Score (AUDIT): 1 Alcohol Brief Interventions/Follow-up: Alcohol education/Brief advice Substance Abuse History in the last 12 months:  Yes.   Consequences of Substance Abuse: Medical Consequences:  Mat Previous Psychotropic Medications: Yes  Psychological Evaluations: Yes  Past Medical History:  Past Medical History:  Diagnosis Date   Anemia 05/24/2022   Arthritis    COVID-19    ETOH abuse    Hepatitis C    pt  states she has been cured   Hypertension    Seizures (HCC)     Past Surgical History:  Procedure Laterality Date   ABDOMINAL HYSTERECTOMY     "partial hysterectomy"   ELBOW SURGERY     ESOPHAGOGASTRODUODENOSCOPY (EGD) WITH PROPOFOL N/A 05/24/2022   Procedure: ESOPHAGOGASTRODUODENOSCOPY (EGD) WITH PROPOFOL;  Surgeon: Toney Reil, MD;  Location: [facility] ENDOSCOPY;  Service: Gastroenterology;  Laterality: N/A;   Family History:  Family History  Problem Relation Age of Onset   Diabetes Sister    Colon cancer Sister    Family Psychiatric  History: Unremarkable Tobacco Screening:  Social History   Tobacco Use  Smoking Status Every Day   Current packs/day: 1.00   Types: Cigarettes  Smokeless Tobacco Never    BH Tobacco Counseling     Are you interested in Tobacco Cessation Medications?  Yes, implement Nicotene Replacement Protocol Counseled patient on smoking cessation:  Refused/Declined practical counseling Reason Tobacco Screening Not Completed: No value filed.       Social History:  Social History   Substance and Sexual Activity  Alcohol Use Yes     Social History   Substance and Sexual Activity  Drug Use No    Additional Social History:                           Allergies:   Allergies  Allergen Reactions   Codeine Nausea And Vomiting   Morphine And Codeine Other (See Comments)    Seizure    Sulfa Antibiotics Nausea And Vomiting   Lab Results:  Results for orders placed or performed during the hospital encounter of 07/13/23 (from the past 48 hour(s))  Glucose, capillary     Status: Abnormal   Collection Time: 07/14/23  4:24 PM  Result Value Ref Range   Glucose-Capillary 256 (H) 70 - 99 mg/dL    Comment: Glucose reference range applies only to samples taken after fasting for at least 8 hours.  SARS Coronavirus 2 by RT PCR (hospital order, performed in [facility] hospital lab) *cepheid single result test* Anterior Nasal Swab     Status:  None   Collection Time: 07/15/23 10:49 AM   Specimen: Anterior Nasal Swab  Result Value Ref Range   SARS Coronavirus 2 by RT PCR NEGATIVE NEGATIVE    Comment: (NOTE) SARS-CoV-2 target nucleic acids are NOT DETECTED.  The SARS-CoV-2 RNA is generally detectable in upper and lower respiratory specimens during the acute phase of infection. The lowest concentration of SARS-CoV-2 viral copies this assay can detect is 250 copies / mL. A negative result does not preclude SARS-CoV-2 infection and should not be used as the sole basis for treatment or other patient management decisions.  A negative result may occur with improper specimen collection / handling, submission of specimen other than nasopharyngeal swab, presence of viral mutation(s) within the areas targeted by this assay, and inadequate number of viral copies (<250 copies /  mL). A negative result must be combined with clinical observations, patient history, and epidemiological information.  Fact Sheet for Patients:   RoadLapTop.co.za  Fact Sheet for Healthcare Providers: http://kim-miller.com/  This test is not yet approved or  cleared by the [place] FDA and has been authorized for detection and/or diagnosis of SARS-CoV-2 by FDA under an Emergency Use Authorization (EUA).  This EUA will remain in effect (meaning this test can be used) for the duration of the COVID-19 declaration under Section 564(b)(1) of the Act, 21 U.S.C. section 360bbb-3(b)(1), unless the authorization is terminated or revoked sooner.  Performed at [facility], [address], [place], [place] [address]     Blood Alcohol level:  Lab Results  Component Value Date   [facility] <10 07/13/2023   ETH <10 03/05/2023    Metabolic Disorder Labs:  No results found for: "HGBA1C", "MPG" No results found for: "PROLACTIN" Lab Results  Component Value Date   TRIG 61 04/29/2022    Current  Medications: Current Facility-Administered Medications  Medication Dose Route Frequency Provider Last Rate Last Admin   acetaminophen (TYLENOL) tablet 650 mg  650 mg Oral Q6H PRN Lauree Chandler, NP   650 mg at 07/16/23 0753   ALPRAZolam Prudy Feeler) tablet 0.5 mg  0.5 mg Oral TID Lauree Chandler, NP   0.5 mg at 07/16/23 1005   alum & mag hydroxide-simeth (MAALOX/MYLANTA) 200-200-20 MG/5ML suspension 30 mL  30 mL Oral Q4H PRN Lauree Chandler, NP       buprenorphine-naloxone (SUBOXONE) 8-2 mg per SL tablet 1 tablet  1 tablet Sublingual BID Lauree Chandler, NP   1 tablet at 07/16/23 1006   butalbital-acetaminophen-caffeine (FIORICET) 50-325-40 MG per tablet 1 tablet  1 tablet Oral Q6H PRN Lauree Chandler, NP       citalopram (CELEXA) tablet 40 mg  40 mg Oral Daily Lauree Chandler, NP   40 mg at 07/16/23 1004   cloNIDine (CATAPRES) tablet 0.1 mg  0.1 mg Oral BID Lauree Chandler, NP   0.1 mg at 07/16/23 1005   folic acid (FOLVITE) tablet 1 mg  1 mg Oral Daily Lauree Chandler, NP   1 mg at 07/16/23 1004   LORazepam (ATIVAN) tablet 1-4 mg  1-4 mg Oral Q1H PRN Lauree Chandler, NP       Or   LORazepam (ATIVAN) injection 1-4 mg  1-4 mg Intravenous Q1H PRN Lauree Chandler, NP       magnesium hydroxide (MILK OF MAGNESIA) suspension 30 mL  30 mL Oral Daily PRN Lauree Chandler, NP       multivitamin with minerals tablet 1 tablet  1 tablet Oral Daily Lauree Chandler, NP   1 tablet at 07/16/23 1004   OLANZapine (ZYPREXA) injection 5 mg  5 mg Intramuscular TID PRN Lauree Chandler, NP       OLANZapine zydis (ZYPREXA) disintegrating tablet 5 mg  5 mg Oral TID PRN Lauree Chandler, NP       ondansetron [facility]) tablet 4 mg  4 mg Oral Q6H PRN Lauree Chandler, NP       Or   ondansetron [facility]) injection 4 mg  4 mg Intravenous Q6H PRN Lauree Chandler, NP       thiamine (VITAMIN B1) tablet 100 mg  100 mg Oral Daily Lauree Chandler, NP   100 mg at 07/16/23  1004   Or   thiamine (VITAMIN B1) injection 100 mg  100  mg Intravenous Daily Lauree Chandler, NP       PTA Medications: Medications Prior to Admission  Medication Sig Dispense Refill Last Dose   ALPRAZolam (XANAX) 1 MG tablet Take 1 mg by mouth 3 (three) times daily.      buprenorphine-naloxone (SUBOXONE) 8-2 mg SUBL SL tablet Place 1 tablet under the tongue 2 (two) times daily.      Cholecalciferol 50 MCG (2000 UT) CAPS Take 1 tablet by mouth daily.      citalopram (CELEXA) 40 MG tablet Take 40 mg by mouth daily.      cloNIDine (CATAPRES) 0.1 MG tablet Take 0.1 mg by mouth 2 (two) times daily.      furosemide (LASIX) 20 MG tablet Take 20 mg by mouth daily as needed.      Multiple Vitamin (MULTIVITAMIN WITH MINERALS) TABS tablet Take 1 tablet by mouth daily. (Patient not taking: Reported on 07/13/2023) 30 tablet 0    thiamine (VITAMIN B-1) 100 MG tablet Take 1 tablet (100 mg total) by mouth daily. (Patient not taking: Reported on 07/13/2023) 30 tablet 0     Musculoskeletal: Strength & Muscle Tone: within normal limits Gait & Station: normal Patient leans: N/A            Psychiatric Specialty Exam:  Presentation  General Appearance: No data recorded Eye Contact:No data recorded Speech:No data recorded Speech Volume:No data recorded Handedness:No data recorded  Mood and Affect  Mood:No data recorded Affect:No data recorded  Thought Process  Thought Processes:No data recorded Duration of Psychotic Symptoms:N/A Past Diagnosis of Schizophrenia or Psychoactive disorder: No data recorded Descriptions of Associations:No data recorded Orientation:No data recorded Thought Content:No data recorded Hallucinations:No data recorded Ideas of Reference:No data recorded Suicidal Thoughts:No data recorded Homicidal Thoughts:No data recorded  Sensorium  Memory:No data recorded Judgment:No data recorded Insight:No data recorded  Executive Functions  Concentration:No  data recorded Attention Span:No data recorded Recall:No data recorded Fund of Knowledge:No data recorded Language:No data recorded  Psychomotor Activity  Psychomotor Activity:No data recorded  Assets  Assets:No data recorded  Sleep  Sleep:No data recorded   Physical Exam: Physical Exam Constitutional:      Appearance: Normal appearance.  HENT:     Head: Normocephalic and atraumatic.     Mouth/Throat:     Pharynx: Oropharynx is clear.  Eyes:     Pupils: Pupils are equal, round, and reactive to light.  Cardiovascular:     Rate and Rhythm: Normal rate and regular rhythm.  Pulmonary:     Effort: Pulmonary effort is normal.     Breath sounds: Normal breath sounds.  Abdominal:     General: Abdomen is flat.     Palpations: Abdomen is soft.  Musculoskeletal:        General: Normal range of motion.  Skin:    General: Skin is warm and dry.  Neurological:     General: No focal deficit present.     Mental Status: She is alert. Mental status is at baseline.  Psychiatric:        Attention and Perception: Attention and perception normal.        Mood and Affect: Mood is anxious and depressed. Affect is flat.        Speech: Speech normal.        Behavior: Behavior is cooperative.        Thought Content: Thought content normal.        Cognition and Memory: Cognition and memory normal.  Judgment: Judgment is impulsive.    Review of Systems  Constitutional: Negative.   HENT: Negative.    Eyes: Negative.   Respiratory: Negative.    Cardiovascular: Negative.   Gastrointestinal: Negative.   Genitourinary: Negative.   Musculoskeletal: Negative.   Skin: Negative.   Neurological: Negative.   Endo/Heme/Allergies: Negative.   Psychiatric/Behavioral:  Positive for depression.    Blood pressure (!) 152/73, pulse 79, temperature (!) 97.5 F (36.4 C), resp. rate 18, height 5\' 6"  (1.676 m), weight 55.3 kg, SpO2 95%. Body mass index is 19.69 kg/m.  Treatment Plan  Summary: Daily contact with patient to assess and evaluate symptoms and progress in treatment, Medication management, and Plan restart home medications and Zyprexa at bedtime.  Observation Level/Precautions:  15 minute checks  Laboratory:  CBC Chemistry Profile  Psychotherapy:    Medications:    Consultations:    Discharge Concerns:    Estimated LOS:  Other:     Physician Treatment Plan for Primary Diagnosis: MDD (major depressive disorder) Long Term Goal(s): Improvement in symptoms so as ready for discharge  Short Term Goals: Ability to identify changes in lifestyle to reduce recurrence of condition will improve, Ability to verbalize feelings will improve, Ability to disclose and discuss suicidal ideas, Ability to demonstrate self-control will improve, Ability to identify and develop effective coping behaviors will improve, Ability to maintain clinical measurements within normal limits will improve, Compliance with prescribed medications will improve, and Ability to identify triggers associated with substance abuse/mental health issues will improve  Physician Treatment Plan for Secondary Diagnosis: Principal Problem:   MDD (major depressive disorder)   I certify that inpatient services furnished can reasonably be expected to improve the patient's condition.    Sarina Ill, DO 11/20/202412:04 PM

## 2023-07-16 NOTE — Progress Notes (Signed)
   07/16/23 0900  Spiritual Encounters  Type of Visit Follow up  Care provided to: Patient  Conversation partners present during encounter Social worker/Care management/TOC  Referral source Chaplain assessment  Reason for visit Routine spiritual support  OnCall Visit No  Spiritual Framework  Presenting Themes Meaning/purpose/sources of inspiration;Goals in life/care;Values and beliefs  Community/Connection Family;Friend(s);Significant other   Chaplain stopped to talk with patient while she was in the dayroom. Patient told the chaplain she talked to her daughter last night and that she told her that her partner is still alive and has been moved to a new hospice place. Patient stated that she wants to talk with a doctor to find out when she can leave. Chaplain spoke with social worker to let her know what the patient would like to talk about and what she needs assistance with. Chaplain service remain available for spiritual and emotional support.

## 2023-07-16 NOTE — [facility] Suicide Risk Assessment (Signed)
[facility] Admission Suicide Risk Assessment   Nursing information obtained from:  Patient Demographic factors:  Age 66 or older, Caucasian Current Mental Status:  NA Loss Factors:  Loss of significant relationship Historical Factors:  NA Risk Reduction Factors:  NA  Total Time spent with patient: 1 hour Principal Problem: MDD (major depressive disorder) Diagnosis:  Principal Problem:   MDD (major depressive disorder)  Subjective Data:  67 yo female presented after taking an intentional overdose on Flexeril and clonidine to die with the goal to go be with her boyfriend who is dying.  On assessment, she was unable to remember what happened which concerned her neighbor, who was at her bed side.  She was confused and has a history of Suboxone therapy along with benzodiazepine and alcohol dependency.  Difficult to obtain much history except when she was told she would be admitted psychiatrically after she was medically cleared, she became upset and stated, "I don't need help".  Per notes, her daughter reported she had been threatening to overdose in the past 2-3 days.  Geriatric psych admission required.   Continued Clinical Symptoms:  Alcohol Use Disorder Identification Test Final Score (AUDIT): 1 The "Alcohol Use Disorders Identification Test", Guidelines for Use in Primary Care, Second Edition.  World Science writer [facility]). Score between 0-7:  no or low risk or alcohol related problems. Score between 8-15:  moderate risk of alcohol related problems. Score between 16-19:  high risk of alcohol related problems. Score 20 or above:  warrants further diagnostic evaluation for alcohol dependence and treatment.   CLINICAL FACTORS:   Depression:   Impulsivity   Musculoskeletal: Strength & Muscle Tone: within normal limits Gait & Station: normal Patient leans: N/A  Psychiatric Specialty Exam:  Presentation  General Appearance: No data recorded Eye Contact:No data recorded Speech:No data  recorded Speech Volume:No data recorded Handedness:No data recorded  Mood and Affect  Mood:No data recorded Affect:No data recorded  Thought Process  Thought Processes:No data recorded Descriptions of Associations:No data recorded Orientation:No data recorded Thought Content:No data recorded History of Schizophrenia/Schizoaffective disorder:No data recorded Duration of Psychotic Symptoms:No data recorded Hallucinations:No data recorded Ideas of Reference:No data recorded Suicidal Thoughts:No data recorded Homicidal Thoughts:No data recorded  Sensorium  Memory:No data recorded Judgment:No data recorded Insight:No data recorded  Executive Functions  Concentration:No data recorded Attention Span:No data recorded Recall:No data recorded Fund of Knowledge:No data recorded Language:No data recorded  Psychomotor Activity  Psychomotor Activity:No data recorded  Assets  Assets:No data recorded  Sleep  Sleep:No data recorded    Blood pressure (!) 152/73, pulse 79, temperature (!) 97.5 F (36.4 C), resp. rate 18, height 5\' 6"  (1.676 m), weight 55.3 kg, SpO2 95%. Body mass index is 19.69 kg/m.   COGNITIVE FEATURES THAT CONTRIBUTE TO RISK:  Closed-mindedness    SUICIDE RISK:   Mild:  Suicidal ideation of limited frequency, intensity, duration, and specificity.  There are no identifiable plans, no associated intent, mild dysphoria and related symptoms, good self-control (both objective and subjective assessment), few other risk factors, and identifiable protective factors, including available and accessible social support.  PLAN OF CARE: See orders  I certify that inpatient services furnished can reasonably be expected to improve the patient's condition.   Sarina Ill, DO 07/16/2023, 11:58 AM

## 2023-07-16 NOTE — Group Note (Signed)
[facility] LCSW Group Therapy Note    Group Date: 07/16/2023 Start Time: 1300 End Time: 1400  Type of Therapy and Topic:  Group Therapy:  Overcoming Obstacles  Participation Level:  [facility] PARTICIPATION LEVEL: Active  Mood:  Description of Group:   In this group patients will be encouraged to explore what they see as obstacles to their own wellness and recovery. They will be guided to discuss their thoughts, feelings, and behaviors related to these obstacles. The group will process together ways to cope with barriers, with attention given to specific choices patients can make. Each patient will be challenged to identify changes they are motivated to make in order to overcome their obstacles. This group will be process-oriented, with patients participating in exploration of their own experiences as well as giving and receiving support and challenge from other group members.  Therapeutic Goals: 1. Patient will identify personal and current obstacles as they relate to admission. 2. Patient will identify barriers that currently interfere with their wellness or overcoming obstacles.  3. Patient will identify feelings, thought process and behaviors related to these barriers. 4. Patient will identify two changes they are willing to make to overcome these obstacles:    Summary of Patient Progress   Pt discussed the death of her husband and reports she knows she can overcome the pain she feels but she knows it will be hard.    Therapeutic Modalities:   Cognitive Behavioral Therapy Solution Focused Therapy Motivational Interviewing Relapse Prevention Therapy   Elza Rafter, LCSWA

## 2023-07-16 NOTE — Progress Notes (Signed)
   07/16/23 2000  Psych Admission Type (Psych Patients Only)  Admission Status Involuntary  Psychosocial Assessment  Patient Complaints Anxiety;Crying spells;Depression;Sadness ("ymy husband passed last night")  Eye Contact Brief  Facial Expression Anxious  Affect Anxious;Depressed;Sad  Speech Slow  Interaction Assertive  Motor Activity Slow  Appearance/Hygiene In scrubs  Behavior Characteristics Anxious;Guarded  Mood Anxious;Depressed;Sad  Thought Process  Coherency Circumstantial  Content WDL  Delusions None reported or observed  Perception WDL  Hallucination None reported or observed  Judgment Impaired  Confusion None  Danger to Self  Current suicidal ideation? Denies  Danger to Others  Danger to Others None reported or observed

## 2023-07-16 NOTE — Progress Notes (Signed)
   07/16/23 0300  Psych Admission Type (Psych Patients Only)  Admission Status Involuntary  Psychosocial Assessment  Patient Complaints Crying spells;Depression  Eye Contact Fair  Facial Expression Sad  Affect Sad  Speech Logical/coherent  Interaction Assertive  Motor Activity Slow  Appearance/Hygiene In scrubs  Behavior Characteristics Cooperative  Mood Depressed;Anxious;Despair;Preoccupied  Thought Process  Coherency Circumstantial  Content WDL  Delusions None reported or observed  Perception WDL  Hallucination None reported or observed  Judgment Limited  Confusion None  Danger to Self  Current suicidal ideation? Denies  Danger to Others  Danger to Others None reported or observed

## 2023-07-16 NOTE — Progress Notes (Signed)
   07/16/23 1255  Psych Admission Type (Psych Patients Only)  Admission Status Involuntary  Psychosocial Assessment  Patient Complaints Anxiety  Eye Contact Brief  Facial Expression Anxious  Affect Anxious  Speech Logical/coherent  Interaction Needy;Assertive  Motor Activity Slow  Appearance/Hygiene In scrubs  Behavior Characteristics Cooperative  Mood Anxious  Thought Process  Coherency Circumstantial  Content WDL  Delusions None reported or observed  Perception WDL  Hallucination None reported or observed  Judgment Impaired  Confusion None  Danger to Self  Current suicidal ideation? Denies  Danger to Others  Danger to Others None reported or observed

## 2023-07-16 NOTE — Group Note (Signed)
Date:  07/16/2023 Time:  11:12 AM  Group Topic/Focus:  Wellness Toolbox:   The focus of this group is to discuss various aspects of wellness, balancing those aspects and exploring ways to increase the ability to experience wellness.  Patients will create a wellness toolbox for use upon discharge.    Participation Level:  Active  Participation Quality:  Inattentive and Redirectable  Affect:  Tearful  Cognitive:  Lacking  Insight: Limited  Engagement in Group:  Limited  Modes of Intervention:  Activity and Discussion  Additional Comments:  Patient was sad about  husband passed away in hospice this morning.  Loveda Colaizzi T Noe Gens 07/16/2023, 11:12 AM

## 2023-07-16 NOTE — Progress Notes (Signed)
   07/16/23 0616  15 Minute Checks  Location Bedroom  Visual Appearance Calm  Behavior Sleeping  Sleep (Behavioral Health Patients Only)  Calculate sleep? (Click Yes once per 24 hr at 0600 safety check) Yes  Documented sleep last 24 hours 7

## 2023-07-17 DIAGNOSIS — F332 Major depressive disorder, recurrent severe without psychotic features: Secondary | ICD-10-CM | POA: Diagnosis not present

## 2023-07-17 MED ORDER — FUROSEMIDE 20 MG PO TABS
20.0000 mg | ORAL_TABLET | Freq: Every day | ORAL | Status: DC
  Administered 2023-07-17 – 2023-07-20 (×4): 20 mg via ORAL
  Filled 2023-07-17 (×4): qty 1

## 2023-07-17 MED ORDER — PANTOPRAZOLE SODIUM 40 MG PO TBEC
40.0000 mg | DELAYED_RELEASE_TABLET | Freq: Every day | ORAL | Status: DC
  Administered 2023-07-17 – 2023-07-20 (×4): 40 mg via ORAL
  Filled 2023-07-17 (×4): qty 1

## 2023-07-17 MED ORDER — CLONIDINE HCL 0.1 MG PO TABS
0.1000 mg | ORAL_TABLET | Freq: Three times a day (TID) | ORAL | Status: DC
  Administered 2023-07-17 – 2023-07-18 (×2): 0.1 mg via ORAL
  Filled 2023-07-17 (×2): qty 1

## 2023-07-17 NOTE — Group Note (Unsigned)
Date:  07/17/2023 Time:  12:47 AM  Group Topic/Focus:  Building Self Esteem:   The Focus of this group is helping patients become aware of the effects of self-esteem on their lives, the things they and others do that enhance or undermine their self-esteem, seeing the relationship between their level of self-esteem and the choices they make and learning ways to enhance self-esteem.    Participation Level:  Active  Participation Quality:  Appropriate  Affect:  Appropriate  Cognitive:  Alert  Insight: Appropriate  Engagement in Group:  Engaged  Modes of Intervention:  Discussion  Additional Comments:    Maeola Harman 07/17/2023, 12:47 AM

## 2023-07-17 NOTE — Progress Notes (Signed)
   07/17/23 2100  Psych Admission Type (Psych Patients Only)  Admission Status Involuntary  Psychosocial Assessment  Patient Complaints Anxiety;Crying spells;Depression  Eye Contact Fair  Facial Expression Sad  Affect Sad;Depressed  Speech Slow  Interaction Assertive  Motor Activity Slow  Appearance/Hygiene Disheveled  Behavior Characteristics Cooperative;Anxious  Mood Depressed;Anxious;Sad  Thought Process  Coherency Circumstantial  Content WDL  Delusions None reported or observed  Perception WDL  Hallucination None reported or observed  Judgment Impaired  Confusion None  Danger to Self  Current suicidal ideation? Denies  Danger to Others  Danger to Others None reported or observed  Danger to Others Abnormal  Harmful Behavior to others No threats or harm toward other people  Destructive Behavior No threats or harm toward property

## 2023-07-17 NOTE — Plan of Care (Signed)
D: Pt alert and oriented. Pt endorses anxiety/depression at this time. Pt reports experiencing 6/10 Left rib pain at this time, scheduled medications found helpful. Pt denies experiencing any SI/HI, or AVH at this time.   A: Scheduled medications administered to pt, per MD orders. Support and encouragement provided. Frequent verbal contact made. Routine safety checks conducted q15 minutes.   R: No adverse drug reactions noted. Pt verbally contracts for safety at this time. Pt compliant with medications and treatment plan. Pt interacts well with others on the unit. Pt remains safe at this time. Plan of care ongoing.  Chaplin consult placed for pt and pt found this helpful.   Problem: Nutrition: Goal: Adequate nutrition will be maintained Outcome: Progressing   Problem: Coping: Goal: Level of anxiety will decrease Outcome: Not Progressing

## 2023-07-17 NOTE — Progress Notes (Signed)
   07/17/23 0559  15 Minute Checks  Location Bedroom  Visual Appearance Calm  Behavior Sleeping  Sleep (Behavioral Health Patients Only)  Calculate sleep? (Click Yes once per 24 hr at 0600 safety check) Yes  Documented sleep last 24 hours 8
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
## 2023-07-17 NOTE — Group Note (Signed)
Date:  07/17/2023 Time:  11:55 AM  Group Topic/Focus:  Building Self Esteem:   The Focus of this group is helping patients become aware of the effects of self-esteem on their lives, the things they and others do that enhance or undermine their self-esteem, seeing the relationship between their level of self-esteem and the choices they make and learning ways to enhance self-esteem. Coping With Mental Health Crisis:   The purpose of this group is to help patients identify strategies for coping with mental health crisis.  Group discusses possible causes of crisis and ways to manage them effectively. Managing Feelings:   The focus of this group is to identify what feelings patients have difficulty handling and develop a plan to handle them in a healthier way upon discharge.    Participation Level:  Active  Participation Quality:  Appropriate, Attentive, Sharing, and Supportive  Affect:  Appropriate  Cognitive:  Alert and Appropriate  Insight: Appropriate and Good  Engagement in Group:  Engaged and Supportive  Modes of Intervention:  Discussion  Additional Comments:     Alexis Frock 07/17/2023, 11:55 AM

## 2023-07-17 NOTE — Progress Notes (Signed)
   07/17/23 1300  Spiritual Encounters  Type of Visit Initial  Care provided to: Patient  Referral source Patient request  Reason for visit Grief/loss  OnCall Visit Yes  Spiritual Framework  Presenting Themes Impactful experiences and emotions  Community/Connection Family;Friend(s)  Interventions  Spiritual Care Interventions Made Established relationship of care and support;Compassionate presence;Reflective listening;Normalization of emotions;Reconciliation with self/others;Bereavement/grief support;Prayer;Encouragement;Supported grief process  Intervention Outcomes  Outcomes Connection to spiritual care;Awareness of support;Reduced anxiety  Spiritual Care Plan  Spiritual Care Issues Still Outstanding No further spiritual care needs at this time (see row info)   Chaplain spiritual support services remain available as the need arises.

## 2023-07-17 NOTE — Group Note (Unsigned)
Date:  07/17/2023 Time:  8:36 PM  Group Topic/Focus:  Making Healthy Choices:   The focus of this group is to help patients identify negative/unhealthy choices they were using prior to admission and identify positive/healthier coping strategies to replace them upon discharge.     Participation Level:  {BHH PARTICIPATION ZOXWR:60454}  Participation Quality:  {BHH PARTICIPATION QUALITY:22265}  Affect:  {BHH AFFECT:22266}  Cognitive:  {BHH COGNITIVE:22267}  Insight: {BHH Insight2:20797}  Engagement in Group:  {BHH ENGAGEMENT IN UJWJX:91478}  Modes of Intervention:  {BHH MODES OF INTERVENTION:22269}  Additional Comments:  ***  Burt Ek 07/17/2023, 8:36 PM
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
## 2023-07-17 NOTE — Group Note (Signed)
Date:  07/17/2023 Time:  8:50 PM  Group Topic/Focus:  Making Healthy Choices:   The focus of this group is to help patients identify negative/unhealthy choices they were using prior to admission and identify positive/healthier coping strategies to replace them upon discharge.    Participation Level:  Active  Participation Quality:  Appropriate  Affect:  Appropriate  Cognitive:  Appropriate  Insight: Appropriate  Engagement in Group:  Engaged  Modes of Intervention:  Clarification and Discussion  Additional Comments:    Burt Ek 07/17/2023, 8:50 PM

## 2023-07-17 NOTE — Progress Notes (Signed)
[facility] MD Progress Note  07/17/2023 1:59 PM Stephanie Skinner  MRN:  [id number] Subjective: Stephanie Skinner is seen on rounds.  She refused the Zyprexa last night.  She says that she has been on Paxil before and it made her heart race I said it is not anything like Paxil.  She says that she will try it tonight.  I told her that I think it would help with her appetite, sleep, and mood much quicker so that she can get out here.  Her blood pressure has been higher so she wants her clonidine increased and I told her I would put her back on Lasix. Principal Problem: MDD (major depressive disorder) Diagnosis: Principal Problem:   MDD (major depressive disorder)  Total Time spent with patient: 15 minutes  Past Psychiatric History: Depression and grief  Past Medical History:  Past Medical History:  Diagnosis Date   Anemia 05/24/2022   Arthritis    COVID-19    ETOH abuse    Hepatitis C    pt states she has been cured   Hypertension    Seizures (HCC)     Past Surgical History:  Procedure Laterality Date   ABDOMINAL HYSTERECTOMY     "partial hysterectomy"   ELBOW SURGERY     ESOPHAGOGASTRODUODENOSCOPY (EGD) WITH PROPOFOL N/A 05/24/2022   Procedure: ESOPHAGOGASTRODUODENOSCOPY (EGD) WITH PROPOFOL;  Surgeon: Toney Reil, MD;  Location: [facility] ENDOSCOPY;  Service: Gastroenterology;  Laterality: N/A;   Family History:  Family History  Problem Relation Age of Onset   Diabetes Sister    Colon cancer Sister    Family Psychiatric  History: Unremarkable Social History:  Social History   Substance and Sexual Activity  Alcohol Use Yes     Social History   Substance and Sexual Activity  Drug Use No    Social History   Socioeconomic History   Marital status: Divorced    Spouse name: Not on file   Number of children: Not on file   Years of education: Not on file   Highest education level: Not on file  Occupational History   Not on file  Tobacco Use   Smoking status: Every Day    Current  packs/day: 1.00    Types: Cigarettes   Smokeless tobacco: Never  Vaping Use   Vaping status: Every Day  Substance and Sexual Activity   Alcohol use: Yes   Drug use: No   Sexual activity: Not Currently  Other Topics Concern   Not on file  Social History Narrative   Not on file   Social Determinants of Health   Financial Resource Strain: Low Risk  (05/17/2022)   Received from [facility]   Overall Financial Resource Strain (CARDIA)    Difficulty of Paying Living Expenses: Not hard at all  Food Insecurity: No Food Insecurity (07/15/2023)   Hunger Vital Sign    Worried About Running Out of Food in the Last Year: Never true    Ran Out of Food in the Last Year: Never true  Transportation Needs: No Transportation Needs (07/15/2023)   PRAPARE - Administrator, Civil Service (Medical): No    Lack of Transportation (Non-Medical): No  Physical Activity: Not on file  Stress: Not on file  Social Connections: Not on file   Additional Social History:                         Sleep: Good  Appetite:  Fair  Current Medications: Current Facility-Administered Medications  Medication Dose Route Frequency Provider Last Rate Last Admin   acetaminophen (TYLENOL) tablet 650 mg  650 mg Oral Q6H PRN Lauree Chandler, NP   650 mg at 07/16/23 0753   ALPRAZolam Prudy Feeler) tablet 0.5 mg  0.5 mg Oral TID Lauree Chandler, NP   0.5 mg at 07/17/23 0813   alum & mag hydroxide-simeth (MAALOX/MYLANTA) 200-200-20 MG/5ML suspension 30 mL  30 mL Oral Q4H PRN Lauree Chandler, NP       buprenorphine-naloxone (SUBOXONE) 8-2 mg per SL tablet 1 tablet  1 tablet Sublingual BID Lauree Chandler, NP   1 tablet at 07/17/23 0813   butalbital-acetaminophen-caffeine (FIORICET) 50-325-40 MG per tablet 1 tablet  1 tablet Oral Q6H PRN Lauree Chandler, NP       citalopram (CELEXA) tablet 40 mg  40 mg Oral Daily Lauree Chandler, NP   40 mg at 07/17/23 0813   cloNIDine  (CATAPRES) tablet 0.1 mg  0.1 mg Oral BID Lauree Chandler, NP   0.1 mg at 07/17/23 0813   folic acid (FOLVITE) tablet 1 mg  1 mg Oral Daily Lauree Chandler, NP   1 mg at 07/17/23 0813   magnesium hydroxide (MILK OF MAGNESIA) suspension 30 mL  30 mL Oral Daily PRN Lauree Chandler, NP       multivitamin with minerals tablet 1 tablet  1 tablet Oral Daily Lauree Chandler, NP   1 tablet at 07/17/23 0813   OLANZapine (ZYPREXA) injection 5 mg  5 mg Intramuscular TID PRN Lauree Chandler, NP       OLANZapine (ZYPREXA) tablet 10 mg  10 mg Oral QHS Sherisa Gilvin Edward, DO       OLANZapine zydis (ZYPREXA) disintegrating tablet 5 mg  5 mg Oral TID PRN Lauree Chandler, NP       ondansetron [facility]) tablet 4 mg  4 mg Oral Q6H PRN Lauree Chandler, NP       Or   ondansetron [facility]) injection 4 mg  4 mg Intravenous Q6H PRN Lauree Chandler, NP       thiamine (VITAMIN B1) tablet 100 mg  100 mg Oral Daily Lauree Chandler, NP   100 mg at 07/17/23 8469   Or   thiamine (VITAMIN B1) injection 100 mg  100 mg Intravenous Daily Lauree Chandler, NP       traZODone (DESYREL) tablet 50 mg  50 mg Oral QHS PRN Sarina Ill, DO   50 mg at 07/16/23 2025    Lab Results: No results found for this or any previous visit (from the past 48 hour(s)).  Blood Alcohol level:  Lab Results  Component Value Date   ETH <10 07/13/2023   ETH <10 03/05/2023    Metabolic Disorder Labs: No results found for: "HGBA1C", "MPG" No results found for: "PROLACTIN" Lab Results  Component Value Date   TRIG 61 04/29/2022    Physical Findings: AIMS:  , ,  ,  ,    CIWA:  CIWA-Ar Total: 4 COWS:     Musculoskeletal: Strength & Muscle Tone: within normal limits Gait & Station: normal Patient leans: N/A  Psychiatric Specialty Exam:  Presentation  General Appearance: No data recorded Eye Contact:No data recorded Speech:No data recorded Speech Volume:No data recorded Handedness:No  data recorded  Mood and Affect  Mood:No data recorded Affect:No data recorded  Thought Process  Thought Processes:No data recorded Descriptions of Associations:No data  recorded Orientation:No data recorded Thought Content:No data recorded History of Schizophrenia/Schizoaffective disorder:No data recorded Duration of Psychotic Symptoms:No data recorded Hallucinations:No data recorded Ideas of Reference:No data recorded Suicidal Thoughts:No data recorded Homicidal Thoughts:No data recorded  Sensorium  Memory:No data recorded Judgment:No data recorded Insight:No data recorded  Executive Functions  Concentration:No data recorded Attention Span:No data recorded Recall:No data recorded Fund of Knowledge:No data recorded Language:No data recorded  Psychomotor Activity  Psychomotor Activity:No data recorded  Assets  Assets:No data recorded  Sleep  Sleep:No data recorded    Blood pressure (!) 153/70, pulse 71, temperature 98.8 F (37.1 C), resp. rate 16, height 5\' 6"  (1.676 m), weight 55.3 kg, SpO2 97%. Body mass index is 19.69 kg/m.   Treatment Plan Summary: Daily contact with patient to assess and evaluate symptoms and progress in treatment, Medication management, and Plan increase clonidine to 0.1 3 times a day and encouraged her to take Zyprexa at bedtime.  Start Lasix 20 mg/day.  Sarina Ill, DO 07/17/2023, 1:59 PM

## 2023-07-18 DIAGNOSIS — F332 Major depressive disorder, recurrent severe without psychotic features: Secondary | ICD-10-CM | POA: Diagnosis not present

## 2023-07-18 LAB — LIPID PANEL
Cholesterol: 180 mg/dL (ref 0–200)
HDL: 60 mg/dL (ref >40)
LDL Cholesterol: 102 mg/dL — ABNORMAL HIGH (ref 0–99)
Total CHOL/HDL Ratio: 3 {ratio}
Triglycerides: 89 mg/dL (ref <150)
VLDL: 18 mg/dL (ref 0–40)

## 2023-07-18 LAB — CULTURE, BLOOD (ROUTINE X 2)
Culture: NO GROWTH
Culture: NO GROWTH
Special Requests: ADEQUATE
Special Requests: ADEQUATE

## 2023-07-18 LAB — HEMOGLOBIN A1C
Hgb A1c MFr Bld: 5.7 % — ABNORMAL HIGH (ref 4.8–5.6)
Mean Plasma Glucose: 116.89 mg/dL

## 2023-07-18 MED ORDER — ALPRAZOLAM 0.5 MG PO TABS
1.0000 mg | ORAL_TABLET | Freq: Three times a day (TID) | ORAL | Status: DC
  Administered 2023-07-18 – 2023-07-19 (×3): 1 mg via ORAL
  Filled 2023-07-18 (×3): qty 2

## 2023-07-18 MED ORDER — CLONIDINE HCL 0.1 MG PO TABS
0.2000 mg | ORAL_TABLET | ORAL | Status: DC
  Administered 2023-07-18 – 2023-07-20 (×4): 0.2 mg via ORAL
  Filled 2023-07-18 (×4): qty 2

## 2023-07-18 NOTE — Group Note (Signed)
Recreation Therapy Group Note   Group Topic:Coping Skills  Group Date: 07/18/2023 Start Time: 1400 End Time: 1450 Facilitators: Rosina Lowenstein, LRT, CTRS Location: Dayroom  Group Description: Meditation. LRT and patients discussed what they know about meditation and mindfulness. LRT played a Deep Breathing Meditation exercise script for patients to follow along to. LRT and patients discussed how meditation and deep breathing can be used as a coping skill post--discharge.   Goal Area(s) Addressed: Patient will practice using relaxation technique. Patient will identify a new coping skill.  Patient will follow multistep directions to reduce anxiety and stress.   Affect/Mood: Labile   Participation Level: Engaged   Participation Quality: Independent   Behavior: Cooperative   Speech/Thought Process: Coherent   Insight: Fair   Judgement: Fair    Modes of Intervention: Activity, Education, and Exploration   Patient Response to Interventions:  Attentive and Engaged   Education Outcome:  Acknowledges education   Clinical Observations/Individualized Feedback: Stephanie Skinner was mostly active in their participation of session activities and group discussion. Pt seemed to get frustrated with the noise coming from the unit and got up and left the dayroom saying that it was "ruined". LRT checked on pt after and she said she was "fine" and that she just wanted to go home.   Plan: Continue to engage patient in RT group sessions 2-3x/week.   Rosina Lowenstein, LRT, CTRS 07/18/2023 3:05 PM

## 2023-07-18 NOTE — BHH Counselor (Signed)
CSW contact Traci Denner,  [phone] ,  to complete SPE.   Traci reports that she and pt have been friends for years.   Traci reports that she also lost her husband a year ago so they share that, and that she was friends with pt's husband as well.   Traci reports she lives 1/3 of a mile away from pt and will be staying with pt for a few weeks to help around the house and keep pt company while they plan the funeral.   Gloris Manchester reports she plans to visit pt tonight and bring her clothes and hygiene products.   Traci reports there are no weapons in the home.   CSW will inform provider of completion of SPE.    Reynaldo Minium, MSW, [place] 07/18/2023 3:02 PM

## 2023-07-18 NOTE — Group Note (Signed)
Date:  07/18/2023 Time:  9:12 PM  Group Topic/Focus:  Developing a Wellness Toolbox:   The focus of this group is to help patients develop a "wellness toolbox" with skills and strategies to promote recovery upon discharge.    Participation Level:  Active  Participation Quality:  Appropriate  Affect:  Appropriate  Cognitive:  Appropriate  Insight: Appropriate  Engagement in Group:  Engaged  Modes of Intervention:  Education  Additional Comments:    Garry Heater 07/18/2023, 9:12 PM

## 2023-07-18 NOTE — Group Note (Signed)
Date:  07/18/2023 Time:  10:50 AM  Group Topic/Focus:  Overcoming Stress:   The focus of this group is to define stress and help patients assess their triggers.    Participation Level:  Active  Participation Quality:  Appropriate  Affect:  Appropriate  Cognitive:  Appropriate  Insight: Appropriate  Engagement in Group:  Engaged  Modes of Intervention:  Discussion   Ardelle Anton 07/18/2023, 10:50 AM

## 2023-07-18 NOTE — Progress Notes (Signed)
[facility] MD Progress Note  07/18/2023 3:04 PM Stephanie Skinner  MRN:  [id number] Subjective: Stephanie Skinner is seen on rounds.  No side effects from the Zyprexa.  She did take it last night.  She is still very depressed that her husband died.  He has been compliant with medications.  Her blood pressure continues to be a problem.  I told her that I am going up on her clonidine and her Xanax.  She is on 5 mg of Xanax 3 times a day and we have her on a half a milligram.  Social work spoke with her friend who is going to stay with her and help with funeral arrangements.  I told her I would discharge her on Sunday. Principal Problem: MDD (major depressive disorder) Diagnosis: Principal Problem:   MDD (major depressive disorder)  Total Time spent with patient: 15 minutes  Past Psychiatric History: Depression and anxiety  Past Medical History:  Past Medical History:  Diagnosis Date   Anemia 05/24/2022   Arthritis    COVID-19    ETOH abuse    Hepatitis C    pt states she has been cured   Hypertension    Seizures (HCC)     Past Surgical History:  Procedure Laterality Date   ABDOMINAL HYSTERECTOMY     "partial hysterectomy"   ELBOW SURGERY     ESOPHAGOGASTRODUODENOSCOPY (EGD) WITH PROPOFOL N/A 05/24/2022   Procedure: ESOPHAGOGASTRODUODENOSCOPY (EGD) WITH PROPOFOL;  Surgeon: Toney Reil, MD;  Location: [facility] ENDOSCOPY;  Service: Gastroenterology;  Laterality: N/A;   Family History:  Family History  Problem Relation Age of Onset   Diabetes Sister    Colon cancer Sister    Family Psychiatric  History: Unremarkable Social History:  Social History   Substance and Sexual Activity  Alcohol Use Yes     Social History   Substance and Sexual Activity  Drug Use No    Social History   Socioeconomic History   Marital status: Divorced    Spouse name: Not on file   Number of children: Not on file   Years of education: Not on file   Highest education level: Not on file  Occupational History    Not on file  Tobacco Use   Smoking status: Every Day    Current packs/day: 1.00    Types: Cigarettes   Smokeless tobacco: Never  Vaping Use   Vaping status: Every Day  Substance and Sexual Activity   Alcohol use: Yes   Drug use: No   Sexual activity: Not Currently  Other Topics Concern   Not on file  Social History Narrative   Not on file   Social Determinants of Health   Financial Resource Strain: Low Risk  (05/17/2022)   Received from [facility]   Overall Financial Resource Strain (CARDIA)    Difficulty of Paying Living Expenses: Not hard at all  Food Insecurity: No Food Insecurity (07/15/2023)   Hunger Vital Sign    Worried About Running Out of Food in the Last Year: Never true    Ran Out of Food in the Last Year: Never true  Transportation Needs: No Transportation Needs (07/15/2023)   PRAPARE - Administrator, Civil Service (Medical): No    Lack of Transportation (Non-Medical): No  Physical Activity: Not on file  Stress: Not on file  Social Connections: Not on file   Additional Social History:  Sleep: Good  Appetite:  Good  Current Medications: Current Facility-Administered Medications  Medication Dose Route Frequency Provider Last Rate Last Admin   acetaminophen (TYLENOL) tablet 650 mg  650 mg Oral Q6H PRN Lauree Chandler, NP   650 mg at 07/18/23 1610   ALPRAZolam Prudy Feeler) tablet 1 mg  1 mg Oral TID Sarina Ill, DO       alum & mag hydroxide-simeth (MAALOX/MYLANTA) 200-200-20 MG/5ML suspension 30 mL  30 mL Oral Q4H PRN Lauree Chandler, NP       buprenorphine-naloxone (SUBOXONE) 8-2 mg per SL tablet 1 tablet  1 tablet Sublingual BID Lauree Chandler, NP   1 tablet at 07/18/23 0934   butalbital-acetaminophen-caffeine (FIORICET) 50-325-40 MG per tablet 1 tablet  1 tablet Oral Q6H PRN Lauree Chandler, NP       citalopram (CELEXA) tablet 40 mg  40 mg Oral Daily Lauree Chandler, NP   40 mg at 07/18/23 9604   cloNIDine (CATAPRES) tablet 0.2 mg  0.2 mg Oral [id number] Sarina Ill, DO       folic acid (FOLVITE) tablet 1 mg  1 mg Oral Daily Lauree Chandler, NP   1 mg at 07/18/23 5409   furosemide (LASIX) tablet 20 mg  20 mg Oral QPC breakfast Sarina Ill, DO   20 mg at 07/18/23 8119   magnesium hydroxide (MILK OF MAGNESIA) suspension 30 mL  30 mL Oral Daily PRN Lauree Chandler, NP       multivitamin with minerals tablet 1 tablet  1 tablet Oral Daily Lauree Chandler, NP   1 tablet at 07/18/23 0929   OLANZapine (ZYPREXA) injection 5 mg  5 mg Intramuscular TID PRN Lauree Chandler, NP       OLANZapine (ZYPREXA) tablet 10 mg  10 mg Oral QHS Sarina Ill, DO   10 mg at 07/17/23 2059   OLANZapine zydis (ZYPREXA) disintegrating tablet 5 mg  5 mg Oral TID PRN Lauree Chandler, NP       ondansetron [facility]) tablet 4 mg  4 mg Oral Q6H PRN Lauree Chandler, NP       pantoprazole (PROTONIX) EC tablet 40 mg  40 mg Oral Daily Sarina Ill, DO   40 mg at 07/18/23 1478   thiamine (VITAMIN B1) tablet 100 mg  100 mg Oral Daily Lauree Chandler, NP   100 mg at 07/18/23 2956   traZODone (DESYREL) tablet 50 mg  50 mg Oral QHS PRN Sarina Ill, DO   50 mg at 07/17/23 2046    Lab Results:  Results for orders placed or performed during the hospital encounter of 07/15/23 (from the past 48 hour(s))  Lipid panel     Status: Abnormal   Collection Time: 07/18/23  6:59 AM  Result Value Ref Range   Cholesterol 180 0 - 200 mg/dL   Triglycerides 89 <213 mg/dL   HDL 60 >08 mg/dL   Total CHOL/HDL Ratio 3.0 RATIO   VLDL 18 0 - 40 mg/dL   LDL Cholesterol 657 (H) 0 - 99 mg/dL    Comment:        Total Cholesterol/HDL:CHD Risk Coronary Heart Disease Risk Table                     Men   Women  1/2 Average Risk   3.4   3.3  Average Risk       5.0   4.4  2 X Average Risk   9.6   7.1  3 X Average Risk  23.4   11.0         Use the calculated Patient Ratio above and the CHD Risk Table to determine the patient's CHD Risk.        ATP III CLASSIFICATION (LDL):  <100     mg/dL   Optimal  829-562  mg/dL   Near or Above                    Optimal  130-159  mg/dL   Borderline  130-865  mg/dL   High  >784     mg/dL   Very High Performed at [facility], [address] [place], [place] [address]   Hemoglobin A1c     Status: Abnormal   Collection Time: 07/18/23  6:59 AM  Result Value Ref Range   Hgb A1c MFr Bld 5.7 (H) 4.8 - 5.6 %    Comment: (NOTE) Pre diabetes:          5.7%-6.4%  Diabetes:              >6.4%  Glycemic control for   <7.0% adults with diabetes    Mean Plasma Glucose 116.89 mg/dL    Comment: Performed at [facility], [address], [place], [place] [address]    Blood Alcohol level:  Lab Results  Component Value Date   [facility] <10 07/13/2023   ETH <10 03/05/2023    Metabolic Disorder Labs: Lab Results  Component Value Date   HGBA1C 5.7 (H) 07/18/2023   MPG 116.89 07/18/2023   No results found for: "PROLACTIN" Lab Results  Component Value Date   CHOL 180 07/18/2023   TRIG 89 07/18/2023   HDL 60 07/18/2023   CHOLHDL 3.0 07/18/2023   VLDL 18 07/18/2023   LDLCALC 102 (H) 07/18/2023    Physical Findings: AIMS:  , ,  ,  ,    CIWA:  CIWA-Ar Total: 4 COWS:     Musculoskeletal: Strength & Muscle Tone: within normal limits Gait & Station: normal Patient leans: N/A  Psychiatric Specialty Exam:  Presentation  General Appearance: No data recorded Eye Contact:No data recorded Speech:No data recorded Speech Volume:No data recorded Handedness:No data recorded  Mood and Affect  Mood:No data recorded Affect:No data recorded  Thought Process  Thought Processes:No data recorded Descriptions of Associations:No data recorded Orientation:No data recorded Thought Content:No data recorded History of Schizophrenia/Schizoaffective disorder:No data  recorded Duration of Psychotic Symptoms:No data recorded Hallucinations:No data recorded Ideas of Reference:No data recorded Suicidal Thoughts:No data recorded Homicidal Thoughts:No data recorded  Sensorium  Memory:No data recorded Judgment:No data recorded Insight:No data recorded  Executive Functions  Concentration:No data recorded Attention Span:No data recorded Recall:No data recorded Fund of Knowledge:No data recorded Language:No data recorded  Psychomotor Activity  Psychomotor Activity:No data recorded  Assets  Assets:No data recorded  Sleep  Sleep:No data recorded    Blood pressure (!) 144/70, pulse 68, temperature 98.6 F (37 C), resp. rate 16, height 5\' 6"  (1.676 m), weight 55.3 kg, SpO2 99%. Body mass index is 19.69 kg/m.   Treatment Plan Summary: Daily contact with patient to assess and evaluate symptoms and progress in treatment, Medication management, and Plan continue current medication.  Increase Xanax to 1 mg 3 times a day and change clonidine to 0.2 twice a day.  Sarina Ill, DO 07/18/2023, 3:04 PM

## 2023-07-18 NOTE — BHH Suicide Risk Assessment (Signed)
B[facility] INPATIENT:  Family/Significant Other Suicide Prevention Education  Suicide Prevention Education:  Education Completed; Suzy Bouchard, [phone] ,friend/neighbor, has been identified by the patient as the family member/significant other with whom the patient will be residing, and identified as the person(s) who will aid the patient in the event of a mental health crisis (suicidal ideations/suicide attempt).  With written consent from the patient, the family member/significant other has been provided the following suicide prevention education, prior to the and/or following the discharge of the patient.  The suicide prevention education provided includes the following: Suicide risk factors Suicide prevention and interventions National Suicide Hotline telephone number [facility] assessment telephone number [facility] Emergency Assistance 911 [facility] and/or Residential Mobile Crisis Unit telephone number  Request made of family/significant other to: Remove weapons (e.g., guns, rifles, knives), all items previously/currently identified as safety concern.   Remove drugs/medications (over-the-counter, prescriptions, illicit drugs), all items previously/currently identified as a safety concern.  The family member/significant other verbalizes understanding of the suicide prevention education information provided.  The family member/significant other agrees to remove the items of safety concern listed above.  Elza Rafter 07/18/2023, 9:01 PM

## 2023-07-18 NOTE — Plan of Care (Signed)
  Problem: Education: Goal: Knowledge of General Education information will improve Description Including pain rating scale, medication(s)/side effects and non-pharmacologic comfort measures Outcome: Progressing   Problem: Health Behavior/Discharge Planning: Goal: Ability to manage health-related needs will improve Outcome: Progressing   

## 2023-07-18 NOTE — Progress Notes (Signed)
   07/18/23 0500  15 Minute Checks  Location Bedroom  Visual Appearance Calm  Behavior Sleeping  Sleep (Behavioral Health Patients Only)  Calculate sleep? (Click Yes once per 24 hr at 0600 safety check) Yes  Documented sleep last 24 hours 9

## 2023-07-18 NOTE — Progress Notes (Signed)
   07/18/23 1400  Psych Admission Type (Psych Patients Only)  Admission Status Involuntary  Psychosocial Assessment  Patient Complaints Crying spells  Eye Contact Fair  Facial Expression Sad  Affect Appropriate to circumstance  Speech Slow  Interaction Assertive  Motor Activity Slow  Appearance/Hygiene Other (Comment) (appropriate)  Behavior Characteristics Appropriate to situation  Mood Anxious  Thought Process  Coherency Circumstantial  Content WDL  Delusions None reported or observed  Perception WDL  Hallucination None reported or observed  Judgment Impaired  Confusion None  Danger to Self  Current suicidal ideation? Denies  Danger to Others  Danger to Others None reported or observed  Danger to Others Abnormal  Harmful Behavior to others No threats or harm toward other people  Destructive Behavior No threats or harm toward property

## 2023-07-18 NOTE — BHH Counselor (Signed)
CSW attempted to get follow-up appt for pt with [facility]   CSW sent message at 3:07 PM on 07/18/23.   CSW awaits response.    If no outpatient appt scheduled, weekend social workers may provide pt with referral information or walk-in hours.    Reynaldo Minium, MSW, [place] 07/18/2023 9:04 PM

## 2023-07-19 DIAGNOSIS — F332 Major depressive disorder, recurrent severe without psychotic features: Secondary | ICD-10-CM | POA: Diagnosis not present

## 2023-07-19 MED ORDER — ALPRAZOLAM 0.5 MG PO TABS
1.0000 mg | ORAL_TABLET | Freq: Three times a day (TID) | ORAL | Status: DC | PRN
  Administered 2023-07-19 – 2023-07-20 (×3): 1 mg via ORAL
  Filled 2023-07-19 (×4): qty 2

## 2023-07-19 NOTE — Plan of Care (Signed)

## 2023-07-19 NOTE — BHH Counselor (Signed)
The patient son-in-law Stephanie Skinner [phone] requested to speak with a Child psychotherapist. The LCSWA contacted him upon his request. The son-in-law had a few questions regarding the patient discharge. The LCSWA informed him that the patient anticipated discharge date was 11/24 and that she will have follow up resources on her discharge paperwork. The patient son-in-law reported to the LCSWA that the patient friend Stephanie Skinner is easy to manipulate by the patient and that she would not be considered the most reliable support for the patient.    Patric Dykes, MSW, [employer]

## 2023-07-19 NOTE — Progress Notes (Signed)
Assumed care of patient this pm, she present A&O x 4 , affect sad, mood depressed and anxious at times. Pt out in the milieu, sociable with peers, behavior appropriate to situation and she c/o feeling sad due to the loss of her significant other. Pt focused on having to leave the hospital on Sunday and return home alone but, stated she have lots of support in her community Pt also c/o having "rib" pain, 10/10, requested and were given Tylenol 650 mg and stated it was effective. Pt denied thoughts, plan or intent to harm self or others and made a safety commitment, " I was not trying to hurt myself when I took those pills".  Pt is compliant with taking medications and compliant with her plan of care. Pt educated on plan of care, medication regimen and she acknowledged an understanding and was without further complaints or concerns. Pt is been maintained on q 15 min rounds for safety and support.

## 2023-07-19 NOTE — Plan of Care (Signed)
Problem: Education: Goal: Knowledge of General Education information will improve Description: Including pain rating scale, medication(s)/side effects and non-pharmacologic comfort measures Outcome: Progressing   Problem: Health Behavior/Discharge Planning: Goal: Ability to manage health-related needs will improve Outcome: Progressing   Problem: Clinical Measurements: Goal: Ability to maintain clinical measurements within normal limits will improve Outcome: Progressing Goal: Will remain free from infection Outcome: Progressing Goal: Diagnostic test results will improve Outcome: Progressing Goal: Respiratory complications will improve Outcome: Progressing Goal: Cardiovascular complication will be avoided Outcome: Progressing   Problem: Activity: Goal: Risk for activity intolerance will decrease Outcome: Progressing   Problem: Pain Management: Goal: General experience of comfort will improve Outcome: Progressing   Problem: Safety: Goal: Ability to remain free from injury will improve Outcome: Progressing

## 2023-07-19 NOTE — Group Note (Signed)
LCSW Group Therapy Note   Group Date: 07/19/2023 Start Time: 1310 End Time: 1340   Type of Therapy and Topic:  Group Therapy: Geriatric Depression  Participation Level:  Minimal  Summary of Patient Progress:    Stephanie Skinner stated that Fall was her favorite season during the ice breaker. Stephanie Skinner shared that the loss of loved ones often made her feel depressed.. she was called out by the Dr. Before the 2nd 1/2    Wilson Singer 07/19/2023  4:51 PM

## 2023-07-19 NOTE — Progress Notes (Signed)
   07/19/23 1400  Psych Admission Type (Psych Patients Only)  Admission Status Involuntary  Psychosocial Assessment  Patient Complaints Other (Comment)  Eye Contact Fair  Facial Expression Sad  Affect Appropriate to circumstance  Speech Slow  Interaction Assertive  Motor Activity Slow  Appearance/Hygiene Other (Comment) (appropriate)  Behavior Characteristics Cooperative  Mood Sad  Thought Process  Coherency Circumstantial  Content WDL  Delusions None reported or observed  Perception WDL  Hallucination None reported or observed  Judgment Impaired  Confusion None  Danger to Self  Current suicidal ideation? Denies  Agreement Not to Harm Self Yes  Description of Agreement verbal  Danger to Others  Danger to Others None reported or observed  Danger to Others Abnormal  Harmful Behavior to others No threats or harm toward other people  Destructive Behavior No threats or harm toward property

## 2023-07-19 NOTE — Progress Notes (Signed)
[facility] MD Progress Note  07/19/2023 12:38 PM Stephanie Skinner  MRN:  [id number] Subjective: Stephanie Skinner is seen on rounds.  She is more sleepy today.  I did go up on her Xanax because that is what she takes at home.  I will change it to as needed.  She denies any suicidal ideation.  Her blood pressure is a little bit better.  She has been compliant with Zyprexa. Principal Problem: MDD (major depressive disorder) Diagnosis: Principal Problem:   MDD (major depressive disorder)  Total Time spent with patient: 15 minutes  Past Psychiatric History: Depression and grief  Past Medical History:  Past Medical History:  Diagnosis Date   Anemia 05/24/2022   Arthritis    COVID-19    ETOH abuse    Hepatitis C    pt states she has been cured   Hypertension    Seizures (HCC)     Past Surgical History:  Procedure Laterality Date   ABDOMINAL HYSTERECTOMY     "partial hysterectomy"   ELBOW SURGERY     ESOPHAGOGASTRODUODENOSCOPY (EGD) WITH PROPOFOL N/A 05/24/2022   Procedure: ESOPHAGOGASTRODUODENOSCOPY (EGD) WITH PROPOFOL;  Surgeon: Toney Reil, MD;  Location: [facility] ENDOSCOPY;  Service: Gastroenterology;  Laterality: N/A;   Family History:  Family History  Problem Relation Age of Onset   Diabetes Sister    Colon cancer Sister    Family Psychiatric  History: Unremarkable Social History:  Social History   Substance and Sexual Activity  Alcohol Use Yes     Social History   Substance and Sexual Activity  Drug Use No    Social History   Socioeconomic History   Marital status: Divorced    Spouse name: Not on file   Number of children: Not on file   Years of education: Not on file   Highest education level: Not on file  Occupational History   Not on file  Tobacco Use   Smoking status: Every Day    Current packs/day: 1.00    Types: Cigarettes   Smokeless tobacco: Never  Vaping Use   Vaping status: Every Day  Substance and Sexual Activity   Alcohol use: Yes   Drug use: No    Sexual activity: Not Currently  Other Topics Concern   Not on file  Social History Narrative   Not on file   Social Determinants of Health   Financial Resource Strain: Low Risk  (05/17/2022)   [facility]   Overall Financial Resource Strain (CARDIA)    Difficulty of Paying Living Expenses: Not hard at all  Food Insecurity: No Food Insecurity (07/15/2023)   Hunger Vital Sign    Worried About Running Out of Food in the Last Year: Never true    Ran Out of Food in the Last Year: Never true  Transportation Needs: No Transportation Needs (07/15/2023)   PRAPARE - Administrator, Civil Service (Medical): No    Lack of Transportation (Non-Medical): No  Physical Activity: Not on file  Stress: Not on file  Social Connections: Not on file   Additional Social History:                         Sleep: Good  Appetite:  Good  Current Medications: Current Facility-Administered Medications  Medication Dose Route Frequency Provider Last Rate Last Admin   acetaminophen (TYLENOL) tablet 650 mg  650 mg Oral Q6H PRN Lauree Chandler, NP   [phone]  mg at 07/19/23 0857   ALPRAZolam (XANAX) tablet 1 mg  1 mg Oral TID Sarina Ill, DO   1 mg at 07/19/23 0858   alum & mag hydroxide-simeth (MAALOX/MYLANTA) 200-200-20 MG/5ML suspension 30 mL  30 mL Oral Q4H PRN Lauree Chandler, NP       buprenorphine-naloxone (SUBOXONE) 8-2 mg per SL tablet 1 tablet  1 tablet Sublingual BID Lauree Chandler, NP   1 tablet at 07/19/23 1610   butalbital-acetaminophen-caffeine (FIORICET) 50-325-40 MG per tablet 1 tablet  1 tablet Oral Q6H PRN Lauree Chandler, NP       citalopram (CELEXA) tablet 40 mg  40 mg Oral Daily Lauree Chandler, NP   40 mg at 07/19/23 9604   cloNIDine (CATAPRES) tablet 0.2 mg  0.2 mg Oral [id number] Sarina Ill, DO   0.2 mg at 07/19/23 0900   folic acid (FOLVITE) tablet 1 mg  1 mg Oral Daily Lauree Chandler, NP    1 mg at 07/19/23 5409   furosemide (LASIX) tablet 20 mg  20 mg Oral QPC breakfast Sarina Ill, DO   20 mg at 07/19/23 8119   magnesium hydroxide (MILK OF MAGNESIA) suspension 30 mL  30 mL Oral Daily PRN Lauree Chandler, NP       multivitamin with minerals tablet 1 tablet  1 tablet Oral Daily Lauree Chandler, NP   1 tablet at 07/19/23 0858   OLANZapine (ZYPREXA) injection 5 mg  5 mg Intramuscular TID PRN Lauree Chandler, NP       OLANZapine [facility]) tablet 10 mg  10 mg Oral QHS Sarina Ill, DO   10 mg at 07/18/23 2129   OLANZapine zydis (ZYPREXA) disintegrating tablet 5 mg  5 mg Oral TID PRN Lauree Chandler, NP       [facility]) tablet 4 mg  4 mg Oral Q6H PRN Lauree Chandler, NP       pantoprazole (PROTONIX) EC tablet 40 mg  40 mg Oral Daily Sarina Ill, DO   40 mg at 07/19/23 1478   thiamine (VITAMIN B1) tablet 100 mg  100 mg Oral Daily Lauree Chandler, NP   100 mg at 07/19/23 0858   traZODone (DESYREL) tablet 50 mg  50 mg Oral QHS PRN Sarina Ill, DO   50 mg at 07/18/23 2129    Lab Results:  Results for orders placed or performed during the hospital encounter of 07/15/23 (from the past 48 hour(s))  Lipid panel     Status: Abnormal   Collection Time: 07/18/23  6:59 AM  Result Value Ref Range   Cholesterol 180 0 - 200 mg/dL   Triglycerides 89 <295 mg/dL   HDL 60 >62 mg/dL   Total CHOL/HDL Ratio 3.0 RATIO   VLDL 18 0 - 40 mg/dL   LDL Cholesterol 130 (H) 0 - 99 mg/dL    Comment:        Total Cholesterol/HDL:CHD Risk Coronary Heart Disease Risk Table                     Men   Women  1/2 Average Risk   3.4   3.3  Average Risk       5.0   4.4  2 X Average Risk   9.6   7.1  3 X Average Risk  23.4   11.0        Use the calculated Patient Ratio above and the CHD  Risk Table to determine the patient's CHD Risk.        ATP III CLASSIFICATION (LDL):  <100     mg/dL   Optimal  295-284  mg/dL   Near or Above                     Optimal  130-159  mg/dL   Borderline  132-440  mg/dL   High  >102     mg/dL   Very High Performed at [facility], [address] [place], [place] [address]   Hemoglobin A1c     Status: Abnormal   Collection Time: 07/18/23  6:59 AM  Result Value Ref Range   Hgb A1c MFr Bld 5.7 (H) 4.8 - 5.6 %    Comment: (NOTE) Pre diabetes:          5.7%-6.4%  Diabetes:              >6.4%  Glycemic control for   <7.0% adults with diabetes    Mean Plasma Glucose 116.89 mg/dL    Comment: Performed at [facility], [address], [place], [place] [address]    Blood Alcohol level:  Lab Results  Component Value Date   [facility] <10 07/13/2023   ETH <10 03/05/2023    Metabolic Disorder Labs: Lab Results  Component Value Date   HGBA1C 5.7 (H) 07/18/2023   MPG 116.89 07/18/2023   No results found for: "PROLACTIN" Lab Results  Component Value Date   CHOL 180 07/18/2023   TRIG 89 07/18/2023   HDL 60 07/18/2023   CHOLHDL 3.0 07/18/2023   VLDL 18 07/18/2023   LDLCALC 102 (H) 07/18/2023    Physical Findings: AIMS:  , ,  ,  ,    CIWA:  CIWA-Ar Total: 4 COWS:     Musculoskeletal: Strength & Muscle Tone: within normal limits Gait & Station: normal Patient leans: N/A  Psychiatric Specialty Exam:  Presentation  General Appearance: No data recorded Eye Contact:No data recorded Speech:No data recorded Speech Volume:No data recorded Handedness:No data recorded  Mood and Affect  Mood:No data recorded Affect:No data recorded  Thought Process  Thought Processes:No data recorded Descriptions of Associations:No data recorded Orientation:No data recorded Thought Content:No data recorded History of Schizophrenia/Schizoaffective disorder:No data recorded Duration of Psychotic Symptoms:No data recorded Hallucinations:No data recorded Ideas of Reference:No data recorded Suicidal Thoughts:No data recorded Homicidal Thoughts:No data recorded  Sensorium   Memory:No data recorded Judgment:No data recorded Insight:No data recorded  Executive Functions  Concentration:No data recorded Attention Span:No data recorded Recall:No data recorded Fund of Knowledge:No data recorded Language:No data recorded  Psychomotor Activity  Psychomotor Activity:No data recorded  Assets  Assets:No data recorded  Sleep  Sleep:No data recorded    Blood pressure (!) 146/63, pulse 77, temperature 97.7 F (36.5 C), resp. rate 18, height 5\' 6"  (1.676 m), weight 55.3 kg, SpO2 95%. Body mass index is 19.69 kg/m.   Treatment Plan Summary: Daily contact with patient to assess and evaluate symptoms and progress in treatment, Medication management, and Plan change Xanax to as needed.  Sarina Ill, DO 07/19/2023, 12:38 PM

## 2023-07-20 DIAGNOSIS — F332 Major depressive disorder, recurrent severe without psychotic features: Secondary | ICD-10-CM | POA: Diagnosis not present

## 2023-07-20 MED ORDER — FUROSEMIDE 20 MG PO TABS: 20.0000 mg | ORAL_TABLET | Freq: Every day | ORAL | 3 refills | Status: AC

## 2023-07-20 MED ORDER — CITALOPRAM HYDROBROMIDE 40 MG PO TABS: 40.0000 mg | ORAL_TABLET | Freq: Every day | ORAL | 3 refills | Status: DC

## 2023-07-20 MED ORDER — TRAZODONE HCL 50 MG PO TABS: 50.0000 mg | ORAL_TABLET | Freq: Every evening | ORAL | 3 refills | Status: DC | PRN

## 2023-07-20 MED ORDER — BUPRENORPHINE HCL-NALOXONE HCL 8-2 MG SL SUBL: 1.0000 | SUBLINGUAL_TABLET | Freq: Two times a day (BID) | SUBLINGUAL | 0 refills | Status: DC

## 2023-07-20 MED ORDER — PANTOPRAZOLE SODIUM 40 MG PO TBEC: 40.0000 mg | DELAYED_RELEASE_TABLET | Freq: Every day | ORAL | 3 refills | Status: AC

## 2023-07-20 MED ORDER — OLANZAPINE 10 MG PO TABS: 10.0000 mg | ORAL_TABLET | Freq: Every day | ORAL | 3 refills | Status: DC

## 2023-07-20 MED ORDER — CLONIDINE HCL 0.2 MG PO TABS: 0.2000 mg | ORAL_TABLET | ORAL | 3 refills | Status: DC

## 2023-07-20 MED ORDER — ALPRAZOLAM 1 MG PO TABS: 1.0000 mg | ORAL_TABLET | Freq: Three times a day (TID) | ORAL | 0 refills | Status: DC | PRN

## 2023-07-20 MED ORDER — BUTALBITAL-APAP-CAFFEINE 50-325-40 MG PO TABS: 1.0000 | ORAL_TABLET | Freq: Four times a day (QID) | ORAL | 0 refills | Status: AC | PRN

## 2023-07-20 NOTE — Discharge Summary (Signed)
Physician Discharge Summary Note  Patient:  Stephanie Skinner is an 67 y.o., female MRN:  [id number] DOB:  [birth year] Patient phone:  [phone] (home)  Patient address:   [address] [place] [address],  Total Time spent with patient: 1 hour  Date of Admission:  07/15/2023 Date of Discharge: 07/20/2023  Reason for Admission:  Stephanie Skinner is a 73 year old white female who overtook her medications which included Suboxone and Xanax. She says it was not an over dose to kill herself but she was feeling depressed because her husband was dying and he did diet last night. He was in hospice. His extended family was concerned and she was brought to the emergency room where she was involuntarily admitted to psychiatry. She was prescribed Suboxone and Xanax on 07/04/2023. She does see Dr. Fannie Knee in [place] at [facility]. She does have a long history of depression. She is on Celexa, Xanax, Suboxone for the past 2 years. She does not think that she needs to be here but she obviously has exhibited poor insight. Also, impulsivity.   Principal Problem: MDD (major depressive disorder) Discharge Diagnoses: Principal Problem:   MDD (major depressive disorder)   Past Psychiatric History: She has 1 previous psychiatric hospitalization in [place] about 5 years ago. She sees Dr. Fannie Knee in [place] at [facility].   Past Medical History:  Past Medical History:  Diagnosis Date   Anemia 05/24/2022   Arthritis    COVID-19    ETOH abuse    Hepatitis C    pt states she has been cured   Hypertension    Seizures (HCC)     Past Surgical History:  Procedure Laterality Date   ABDOMINAL HYSTERECTOMY     "partial hysterectomy"   ELBOW SURGERY     ESOPHAGOGASTRODUODENOSCOPY (EGD) WITH PROPOFOL N/A 05/24/2022   Procedure: ESOPHAGOGASTRODUODENOSCOPY (EGD) WITH PROPOFOL;  Surgeon: Toney Reil, MD;  Location: [facility] ENDOSCOPY;  Service: Gastroenterology;  Laterality: N/A;   Family  History:  Family History  Problem Relation Age of Onset   Diabetes Sister    Colon cancer Sister    Family Psychiatric  History: Unremarkable Social History:  Social History   Substance and Sexual Activity  Alcohol Use Yes     Social History   Substance and Sexual Activity  Drug Use No    Social History   Socioeconomic History   Marital status: Divorced    Spouse name: Not on file   Number of children: Not on file   Years of education: Not on file   Highest education level: Not on file  Occupational History   Not on file  Tobacco Use   Smoking status: Every Day    Current packs/day: 1.00    Types: Cigarettes   Smokeless tobacco: Never  Vaping Use   Vaping status: Every Day  Substance and Sexual Activity   Alcohol use: Yes   Drug use: No   Sexual activity: Not Currently  Other Topics Concern   Not on file  Social History Narrative   Not on file   Social Determinants of Health   Financial Resource Strain: Low Risk  (05/17/2022)   Received [facility]   Overall Financial Resource Strain (CARDIA)    Difficulty of Paying Living Expenses: Not hard at all  Food Insecurity: No Food Insecurity (07/15/2023)   Hunger Vital Sign    Worried About Running Out of Food in the Last Year: Never true  Ran Out of Food in the Last Year: Never true  Transportation Needs: No Transportation Needs (07/15/2023)   PRAPARE - Administrator, Civil Service (Medical): No    Lack of Transportation (Non-Medical): No  Physical Activity: Not on file  Stress: Not on file  Social Connections: Not on file    Hospital Course: Stephanie Skinner is a 54 year old white female who was involuntarily admitted to geriatric psychiatry under concerns of depression and suicidal ideation.  Her husband passed away on the day of admission and her in-laws were concerned that she was overtaking her medication and was depressed and suicidal.  Upon admission she denied being suicidal  or taking an overdose.  She was restarted on her home medications.  She does see [facility] Fannie Knee at [facility].  She was tearful and grieving the loss of her husband but I believed her when she said she was not suicidal.  She has a close friend that vouched for her.  Her friend lives in the same neighborhood. New medication that was started with Zyprexa 10 mg at bedtime for her depression.  She was pleasant and cooperative and participated in groups.  It was felt that she maximized hospitalization she was discharged home.  On the day of discharge she denied suicidal ideation, homicidal ideation, auditory or visual hallucinations.  Her judgment and insight are good.  Physical Findings: AIMS:  , ,  ,  ,    CIWA:  CIWA-Ar Total: 4 COWS:     Musculoskeletal: Strength & Muscle Tone: within normal limits Gait & Station: normal Patient leans: N/A   Psychiatric Specialty Exam:  Presentation  General Appearance: No data recorded Eye Contact:No data recorded Speech:No data recorded Speech Volume:No data recorded Handedness:No data recorded  Mood and Affect  Mood:No data recorded Affect:No data recorded  Thought Process  Thought Processes:No data recorded Descriptions of Associations:No data recorded Orientation:No data recorded Thought Content:No data recorded History of Schizophrenia/Schizoaffective disorder:No data recorded Duration of Psychotic Symptoms:No data recorded Hallucinations:No data recorded Ideas of Reference:No data recorded Suicidal Thoughts:No data recorded Homicidal Thoughts:No data recorded  Sensorium  Memory:No data recorded Judgment:No data recorded Insight:No data recorded  Executive Functions  Concentration:No data recorded Attention Span:No data recorded Recall:No data recorded Fund of Knowledge:No data recorded Language:No data recorded  Psychomotor Activity  Psychomotor Activity:No data recorded  Assets  Assets:No data recorded  Sleep   Sleep:No data recorded   Physical Exam: Physical Exam Vitals and nursing note reviewed.  Constitutional:      Appearance: Normal appearance. She is normal weight.  Neurological:     General: No focal deficit present.     Mental Status: She is alert and oriented to person, place, and time.  Psychiatric:        Attention and Perception: Attention and perception normal.        Mood and Affect: Mood and affect normal.        Speech: Speech normal.        Behavior: Behavior normal. Behavior is cooperative.        Thought Content: Thought content normal.        Cognition and Memory: Cognition and memory normal.        Judgment: Judgment normal.    Review of Systems  Constitutional: Negative.   HENT: Negative.    Eyes: Negative.   Respiratory: Negative.    Cardiovascular: Negative.   Gastrointestinal: Negative.   Genitourinary: Negative.   Musculoskeletal: Negative.   Skin: Negative.  Neurological: Negative.   Endo/Heme/Allergies: Negative.   Psychiatric/Behavioral: Negative.     Blood pressure (!) 149/60, pulse 69, temperature 97.6 F (36.4 C), resp. rate 16, height 5\' 6"  (1.676 m), weight 55.3 kg, SpO2 99%. Body mass index is 19.69 kg/m.   Social History   Tobacco Use  Smoking Status Every Day   Current packs/day: 1.00   Types: Cigarettes  Smokeless Tobacco Never   Tobacco Cessation:  A prescription for an FDA-approved tobacco cessation medication was offered at discharge and the patient refused   Blood Alcohol level:  Lab Results  Component Value Date   [facility] <10 07/13/2023   ETH <10 03/05/2023    Metabolic Disorder Labs:  Lab Results  Component Value Date   HGBA1C 5.7 (H) 07/18/2023   MPG 116.89 07/18/2023   No results found for: "PROLACTIN" Lab Results  Component Value Date   CHOL 180 07/18/2023   TRIG 89 07/18/2023   HDL 60 07/18/2023   CHOLHDL 3.0 07/18/2023   VLDL 18 07/18/2023   LDLCALC 102 (H) 07/18/2023    See Psychiatric Specialty Exam  and Suicide Risk Assessment completed by Attending Physician prior to discharge.  Discharge destination:  Home  Is patient on multiple antipsychotic therapies at discharge:  No   Has Patient had three or more failed trials of antipsychotic monotherapy by history:  No  Recommended Plan for Multiple Antipsychotic Therapies: NA   Allergies as of 07/20/2023       Reactions   Codeine Nausea And Vomiting   Morphine And Codeine Other (See Comments)   Seizure   Sulfa Antibiotics Nausea And Vomiting        Medication List     STOP taking these medications    thiamine 100 MG tablet Commonly known as: Vitamin B-1       TAKE these medications      Indication  ALPRAZolam 1 MG tablet Commonly known as: XANAX Take 1 tablet (1 mg total) by mouth 3 (three) times daily as needed for anxiety. What changed:  when to take this reasons to take this  Indication: Feeling Anxious   buprenorphine-naloxone 8-2 mg Subl SL tablet Commonly known as: SUBOXONE Place 1 tablet under the tongue 2 (two) times daily.  Indication: Opioid Dependence   butalbital-acetaminophen-caffeine 50-325-40 MG tablet Commonly known as: FIORICET Take 1 tablet by mouth every 6 (six) hours as needed for headache.    Cholecalciferol 50 MCG (2000 UT) Caps Take 1 tablet by mouth daily.    citalopram 40 MG tablet Commonly known as: CELEXA Take 1 tablet (40 mg total) by mouth daily.  Indication: Major Depressive Disorder   cloNIDine 0.2 MG tablet Commonly known as: CATAPRES Take 1 tablet (0.2 mg total) by mouth 2 (two) times daily at 8 am and 4 pm. What changed:  medication strength how much to take when to take this  Indication: High Blood Pressure   furosemide 20 MG tablet Commonly known as: LASIX Take 1 tablet (20 mg total) by mouth daily after breakfast. Start taking on: July 21, 2023 What changed:  when to take this reasons to take this  Indication: High Blood Pressure   multivitamin with  minerals Tabs tablet Take 1 tablet by mouth daily.    OLANZapine 10 MG tablet Commonly known as: ZYPREXA Take 1 tablet (10 mg total) by mouth at bedtime.  Indication: Major Depressive Disorder   pantoprazole 40 MG tablet Commonly known as: PROTONIX Take 1 tablet (40 mg total) by mouth daily. Start  taking on: July 21, 2023  Indication: Gastroesophageal Reflux Disease   traZODone 50 MG tablet Commonly known as: DESYREL Take 1 tablet (50 mg total) by mouth at bedtime as needed for sleep.  Indication: Trouble Sleeping, Major Depressive Disorder        Follow-up Information     [facility] Follow up.   Why: Monday - Friday 8:00 am - 5:00 pm Contact information: [address] [place] [address] [phone]         [facility] .   Contact information: [address] [place] [address] [phone]                 Follow-up recommendations: [facility].    Signed: Sarina Ill, DO 07/20/2023, 11:13 AM

## 2023-07-20 NOTE — BHH Suicide Risk Assessment (Signed)
[facility] Discharge Suicide Risk Assessment   Principal Problem: MDD (major depressive disorder) Discharge Diagnoses: Principal Problem:   MDD (major depressive disorder)   Total Time spent with patient: 1 hour  Musculoskeletal: Strength & Muscle Tone: within normal limits Gait & Station: normal Patient leans: N/A  Psychiatric Specialty Exam  Presentation  General Appearance: No data recorded Eye Contact:No data recorded Speech:No data recorded Speech Volume:No data recorded Handedness:No data recorded  Mood and Affect  Mood:No data recorded Duration of Depression Symptoms: No data recorded Affect:No data recorded  Thought Process  Thought Processes:No data recorded Descriptions of Associations:No data recorded Orientation:No data recorded Thought Content:No data recorded History of Schizophrenia/Schizoaffective disorder:No data recorded Duration of Psychotic Symptoms:No data recorded Hallucinations:No data recorded Ideas of Reference:No data recorded Suicidal Thoughts:No data recorded Homicidal Thoughts:No data recorded  Sensorium  Memory:No data recorded Judgment:No data recorded Insight:No data recorded  Executive Functions  Concentration:No data recorded Attention Span:No data recorded Recall:No data recorded Fund of Knowledge:No data recorded Language:No data recorded  Psychomotor Activity  Psychomotor Activity:No data recorded  Assets  Assets:No data recorded  Sleep  Sleep:No data recorded   Blood pressure (!) 149/60, pulse 69, temperature 97.6 F (36.4 C), resp. rate 16, height 5\' 6"  (1.676 m), weight 55.3 kg, SpO2 99%. Body mass index is 19.69 kg/m.  Mental Status Per Nursing Assessment::   On Admission:  NA  Demographic Factors:  Age 4 or older and Divorced or widowed  Loss Factors: Loss of significant relationship  Historical Factors: NA  Risk Reduction Factors:   NA  Continued Clinical Symptoms: Grief   Cognitive Features That  Contribute To Risk:  None    Suicide Risk:  Minimal: No identifiable suicidal ideation.  Patients presenting with no risk factors but with morbid ruminations; may be classified as minimal risk based on the severity of the depressive symptoms   Follow-up Information     [facility] Follow up.   Why: Monday - Friday 8:00 am - 5:00 pm Contact information: [address] [place] [address] [phone]         [facility] .   Contact information: [address] [place] [address] [phone]                 Plan Of Care/Follow-up recommendations: [facility]   Sarina Ill, DO 07/20/2023, 11:01 AM

## 2023-07-20 NOTE — Group Note (Signed)
Date:  07/20/2023 Time:  11:18 AM  Group Topic/Focus:  Coping With Mental Health Crisis:   The purpose of this group is to help patients identify strategies for coping with mental health crisis.  Group discusses possible causes of crisis and ways to manage them effectively. Goals Group:   The focus of this group is to help patients establish daily goals to achieve during treatment and discuss how the patient can incorporate goal setting into their daily lives to aide in recovery. Healthy Communication:   The focus of this group is to discuss communication, barriers to communication, as well as healthy ways to communicate with others.    Participation Level:  Active  Participation Quality:  Appropriate  Affect:  Appropriate  Cognitive:  Appropriate  Insight: Appropriate  Engagement in Group:  Engaged  Modes of Intervention:  Discussion  Additional Comments:    Naidelin Gugliotta L Trini Christiansen 07/20/2023, 11:18 AM

## 2023-07-20 NOTE — Progress Notes (Signed)
Patient ID: Stephanie Skinner, female   DOB: 19-Feb-1956, 67 y.o.   MRN: [id number]  Patient was discharged from [facility] unit at approx 1220 escorted by staff. Patient denies SI/HI/AVH. Discharge packet to include printed AVS, Suicide Risk Assessment, and Transition Record reviewed with patient. Belongings returned. Suicide safety plan completed with a copy kept in chart.

## 2023-07-20 NOTE — Progress Notes (Signed)
  Signed      Pt is alert and oriented X4. Affect bright/mood congruent. Patient reports anxiety and depression but denies  SI/HI/AVH, pt endorses  pain at this time.    Scheduled medications administered to patient per MD orders. Support and encouragement provided . Routine safety checks conducted every 15 minutes without  incident. . Patient informed to notify staff with problems or concerns and verbalize understanding.      No adverse drug reaction noted. Pt compliant with medications  and treatment plan. Pt receptive calm, cooperative and interact well with others on the unit. Pt contracts for safety and remains safe on the unit at this time.         Pt woke up a few times in the middle of the night endorsing anxiety . Pt received her anti anxiety meds .

## 2023-07-20 NOTE — Progress Notes (Signed)
   07/20/23 0630  15 Minute Checks  Location Nurses Station  Visual Appearance Calm  Behavior Composed  Sleep (Behavioral Health Patients Only)  Calculate sleep? (Click Yes once per 24 hr at 0600 safety check) Yes  Documented sleep last 24 hours 9.5

## 2023-07-20 NOTE — Plan of Care (Signed)

## 2023-07-20 NOTE — Progress Notes (Signed)
  [facility] Adult Case Management Discharge Plan :  Will you be returning to the same living situation after discharge:  Yes,  The patient stated that she will be returning home at discharge.  At discharge, do you have transportation home?: Yes,  The patient stated that her friend Lambert Mody will be picking her up.  Do you have the ability to pay for your medications: Yes,  The patient stated that she has Medicare and Medicaid.   Release of information consent forms completed and in the chart;  Patient's signature needed at discharge.  Patient to Follow up at:  Follow-up Information     [facility] Follow up.   Why: Monday - Friday 8:00 am - 5:00 pm Contact information: [address] [place] [address] [phone]         [facility] .   Contact information: [address] [place] [address] [phone]                 Next level of care provider has access to [facility] Link:yes  Safety Planning and Suicide Prevention discussed: Yes,  Suzy Bouchard, [phone] ,friend/neighbor     Has patient been referred to the Quitline?: Patient refused referral for treatment  Patient has been referred for addiction treatment: Yes, referral information given but appointment not made [facility]  (list facility).  Marshell Levan, LCSW 07/20/2023, 9:46 AM

## 2023-09-22 ENCOUNTER — Emergency Department
Admission: EM | Admit: 2023-09-22 | Discharge: 2023-09-23 | Disposition: A | Discharge status: Home / Self Care | Payer: Medicare HMO | Attending: Emergency Medicine | Admitting: Emergency Medicine

## 2023-09-22 ENCOUNTER — Emergency Department: Payer: Medicare HMO

## 2023-09-22 ENCOUNTER — Other Ambulatory Visit: Payer: Self-pay

## 2023-09-22 DIAGNOSIS — I10 Essential (primary) hypertension: Secondary | ICD-10-CM | POA: Insufficient documentation

## 2023-09-22 DIAGNOSIS — E860 Dehydration: Secondary | ICD-10-CM | POA: Insufficient documentation

## 2023-09-22 DIAGNOSIS — R000 Tachycardia, unspecified: Secondary | ICD-10-CM | POA: Diagnosis not present

## 2023-09-22 DIAGNOSIS — Z79899 Other long term (current) drug therapy: Secondary | ICD-10-CM | POA: Diagnosis not present

## 2023-09-22 DIAGNOSIS — R748 Abnormal levels of other serum enzymes: Secondary | ICD-10-CM | POA: Diagnosis not present

## 2023-09-22 DIAGNOSIS — S0990XA Unspecified injury of head, initial encounter: Secondary | ICD-10-CM | POA: Diagnosis present

## 2023-09-22 DIAGNOSIS — Z043 Encounter for examination and observation following other accident: Secondary | ICD-10-CM | POA: Diagnosis present

## 2023-09-22 DIAGNOSIS — W19XXXA Unspecified fall, initial encounter: Secondary | ICD-10-CM | POA: Diagnosis not present

## 2023-09-22 LAB — URINALYSIS, ROUTINE W REFLEX MICROSCOPIC
Bilirubin Urine: NEGATIVE
Glucose, UA: NEGATIVE mg/dL
Hgb urine dipstick: NEGATIVE
Ketones, ur: NEGATIVE mg/dL
Nitrite: NEGATIVE
Protein, ur: NEGATIVE mg/dL
Specific Gravity, Urine: 1.018 (ref 1.005–1.030)
pH: 5 (ref 5.0–8.0)

## 2023-09-22 LAB — URINE DRUG SCREEN, QUALITATIVE (ARMC ONLY)
Amphetamines, Ur Screen: NOT DETECTED
Barbiturates, Ur Screen: NOT DETECTED
Benzodiazepine, Ur Scrn: POSITIVE — AB
Cannabinoid 50 Ng, Ur ~~LOC~~: NOT DETECTED
Cocaine Metabolite,Ur ~~LOC~~: NOT DETECTED
MDMA (Ecstasy)Ur Screen: NOT DETECTED
Methadone Scn, Ur: NOT DETECTED
Opiate, Ur Screen: NOT DETECTED
Phencyclidine (PCP) Ur S: NOT DETECTED
Tricyclic, Ur Screen: NOT DETECTED

## 2023-09-22 LAB — COMPREHENSIVE METABOLIC PANEL
ALT: 26 U/L (ref 0–44)
AST: 70 U/L — ABNORMAL HIGH (ref 15–41)
Albumin: 4.5 g/dL (ref 3.5–5.0)
Alkaline Phosphatase: 101 U/L (ref 38–126)
Anion gap: 15 (ref 5–15)
BUN: 15 mg/dL (ref 8–23)
CO2: 24 mmol/L (ref 22–32)
Calcium: 10.1 mg/dL (ref 8.9–10.3)
Chloride: 100 mmol/L (ref 98–111)
Creatinine, Ser: 0.79 mg/dL (ref 0.44–1.00)
GFR, Estimated: >60 mL/min (ref >60)
Glucose, Bld: 120 mg/dL — ABNORMAL HIGH (ref 70–99)
Potassium: 4.1 mmol/L (ref 3.5–5.1)
Sodium: 139 mmol/L (ref 135–145)
Total Bilirubin: 0.7 mg/dL (ref 0.0–1.2)
Total Protein: 8.9 g/dL — ABNORMAL HIGH (ref 6.5–8.1)

## 2023-09-22 LAB — CBC WITH DIFFERENTIAL/PLATELET
Abs Immature Granulocytes: 0.03 10*3/uL (ref 0.00–0.07)
Basophils Absolute: 0 10*3/uL (ref 0.0–0.1)
Basophils Relative: 0 %
Eosinophils Absolute: 0 10*3/uL (ref 0.0–0.5)
Eosinophils Relative: 0 %
HCT: 45.2 % (ref 36.0–46.0)
Hemoglobin: 14.6 g/dL (ref 12.0–15.0)
Immature Granulocytes: 0 %
Lymphocytes Relative: 19 %
Lymphs Abs: 2.1 10*3/uL (ref 0.7–4.0)
MCH: 30.7 pg (ref 26.0–34.0)
MCHC: 32.3 g/dL (ref 30.0–36.0)
MCV: 95.2 fL (ref 80.0–100.0)
Monocytes Absolute: 1 10*3/uL (ref 0.1–1.0)
Monocytes Relative: 9 %
Neutro Abs: 7.8 10*3/uL — ABNORMAL HIGH (ref 1.7–7.7)
Neutrophils Relative %: 72 %
Platelets: 205 10*3/uL (ref 150–400)
RBC: 4.75 MIL/uL (ref 3.87–5.11)
RDW: 12.9 % (ref 11.5–15.5)
WBC: 11 10*3/uL — ABNORMAL HIGH (ref 4.0–10.5)
nRBC: 0 % (ref 0.0–0.2)

## 2023-09-22 LAB — ETHANOL: Alcohol, Ethyl (B): <10 mg/dL (ref <10)

## 2023-09-22 LAB — SALICYLATE LEVEL: Salicylate Lvl: <7 mg/dL — ABNORMAL LOW (ref 7.0–30.0)

## 2023-09-22 LAB — T4, FREE: Free T4: 0.95 ng/dL (ref 0.61–1.12)

## 2023-09-22 LAB — CK: Total CK: 2658 U/L — ABNORMAL HIGH (ref 38–234)

## 2023-09-22 LAB — AMMONIA: Ammonia: 15 umol/L (ref 9–35)

## 2023-09-22 LAB — TSH: TSH: 1.086 u[IU]/mL (ref 0.350–4.500)

## 2023-09-22 LAB — ACETAMINOPHEN LEVEL: Acetaminophen (Tylenol), Serum: <10 ug/mL — ABNORMAL LOW (ref 10–30)

## 2023-09-22 MED ORDER — IBUPROFEN 600 MG PO TABS
600.0000 mg | ORAL_TABLET | Freq: Once | ORAL | Status: AC
  Administered 2023-09-22: 600 mg via ORAL
  Filled 2023-09-22: qty 1

## 2023-09-22 MED ORDER — NALOXONE HCL 0.4 MG/ML IJ SOLN
0.1000 mg | Freq: Once | INTRAMUSCULAR | Status: AC
  Administered 2023-09-22: 0.1 mg via INTRAVENOUS
  Filled 2023-09-22: qty 1

## 2023-09-22 MED ORDER — SODIUM CHLORIDE 0.9 % IV BOLUS
1000.0000 mL | Freq: Once | INTRAVENOUS | Status: AC
  Administered 2023-09-22: 1000 mL via INTRAVENOUS

## 2023-09-22 NOTE — Discharge Instructions (Signed)
No acute injuries from your fall today.  You did have 1 value in your laboratory workup called a CK which was elevated likely from you being down on the ground for prolonged period of time.  Please maintain adequate hydration over the next couple of days.  Please follow-up with your primary care provider for reassessment this week.  Please return for any severe worsening symptoms.

## 2023-09-22 NOTE — ED Notes (Addendum)
Pts daughter, Claudell Kyle, called and left a voicemail to pick up pt.

## 2023-09-22 NOTE — ED Triage Notes (Addendum)
Patient is here via EMS for a fall that occurred sometime last night. States she does not remember what happened after that. Patient is very sleepy in triage, but responsive when you talk to her. Denies pain. Unknown LOC. Denies hitting her head. States she was found by her neighbor and roommate. Patient states she took Xanax this morning.

## 2023-09-22 NOTE — ED Provider Notes (Signed)
[facility] Provider Note    Event Date/Time   First MD Initiated Contact with Patient 09/22/23 1541     (approximate)   History   Fall   HPI Stephanie Skinner is a 68 y.o. female with history of depression, anxiety, benzodiazepine abuse, HTN presenting today for fall.  Patient reportedly had a fall that occurred sometime last night.  Patient does not fully remember what happens but denies any pain symptoms elsewhere.  Found reportedly by her neighbor and roommate.  She denies any pain symptoms.  Unsure of loss of consciousness or any head injury.  Not on any blood thinners.  She reports that she took a Xanax this morning but denies any other medication use.  No additional medication use yesterday.  Is sleepy on exam and requires frequent verbal stimulation.  Denies any drug use.  Denies chest pain, shortness of breath, nausea, vomiting, diarrhea, abdominal pain, leg pain, leg weakness, numbness anywhere. Denies SI or HI.  Chart review: Patient had a admission for overdose of her medications which includes Suboxone and Xanax back in November 2024.     Physical Exam   Triage Vital Signs: ED Triage Vitals  Encounter Vitals Group     BP 09/22/23 1433 110/78     Systolic BP Percentile --      Diastolic BP Percentile --      Pulse Rate 09/22/23 1433 (!) 117     Resp 09/22/23 1433 17     Temp 09/22/23 1433 98.5 F (36.9 C)     Temp Source 09/22/23 1433 Oral     SpO2 09/22/23 1433 98 %     Weight --      Height --      Head Circumference --      Peak Flow --      Pain Score 09/22/23 1439 0     Pain Loc --      Pain Education --      Exclude from Growth Chart --     Most recent vital signs: Vitals:   09/22/23 2045 09/22/23 2100  BP: (!) 161/64 (!) 122/52  Pulse: (!) 101 (!) 105  Resp: 14 19  Temp:    SpO2: 99% 98%   Physical Exam: I have reviewed the vital signs and nursing notes. General: Lethargic but arousable to voice and physical stimuli.   Oriented to person place and time. Head:  Atraumatic, normocephalic.   ENT:  EOM intact, PERRL. Oral mucosa is pink and moist with no lesions. Neck: Neck is supple with full range of motion, No meningeal signs. Cardiovascular:  tachycardia, RR, No murmurs. Peripheral pulses palpable and equal bilaterally. Respiratory:  Symmetrical chest wall expansion.  No rhonchi, rales, or wheezes.  Good air movement throughout.  No use of accessory muscles.   Musculoskeletal:  No cyanosis or edema. Moving extremities with full ROM.  No tenderness to C, T, or L-spine.  Nontender throughout bilateral upper and lower extremities. Abdomen:  Soft, nontender, nondistended. Neuro:  moving all four extremities, lethargic and speech intermittently slurred when not fully awake.  Otherwise, cranial nerves II through XII intact.  Sensation equal and intact to bilateral upper and lower extremities.  5 out of 5 strength to bilateral upper and lower extremities. Psych:  Calm, appropriate.   Skin:  Warm, dry, no rash.     ED Results / Procedures / Treatments   Labs (all labs ordered are listed, but only abnormal results are displayed) Labs Reviewed  COMPREHENSIVE METABOLIC PANEL - Abnormal; Notable for the following components:      Result Value   Glucose, Bld 120 (*)    Total Protein 8.9 (*)    AST 70 (*)    All other components within normal limits  CBC WITH DIFFERENTIAL/PLATELET - Abnormal; Notable for the following components:   WBC 11.0 (*)    Neutro Abs 7.8 (*)    All other components within normal limits  CK - Abnormal; Notable for the following components:   Total CK 2,658 (*)    All other components within normal limits  URINALYSIS, ROUTINE W REFLEX MICROSCOPIC - Abnormal; Notable for the following components:   Color, Urine YELLOW (*)    APPearance HAZY (*)    Leukocytes,Ua TRACE (*)    Bacteria, UA RARE (*)    All other components within normal limits  ACETAMINOPHEN LEVEL - Abnormal; Notable for  the following components:   Acetaminophen (Tylenol), Serum <10 (*)    All other components within normal limits  SALICYLATE LEVEL - Abnormal; Notable for the following components:   Salicylate Lvl <7.0 (*)    All other components within normal limits  URINE DRUG SCREEN, QUALITATIVE ([facility] ONLY) - Abnormal; Notable for the following components:   Benzodiazepine, Ur Scrn POSITIVE (*)    All other components within normal limits  TSH  T4, FREE  AMMONIA  ETHANOL     EKG My EKG interpretation: Rate of 128, sinus tachycardia, normal axis, normal intervals.  No acute ST elevations or depressions   RADIOLOGY Independently interpreted CT imaging of head and C-spine with no acute traumatic pathology   PROCEDURES:  Critical Care performed: No  Procedures   MEDICATIONS ORDERED IN ED: Medications  ibuprofen (ADVIL) tablet 600 mg (has no administration in time range)  sodium chloride 0.9 % bolus 1,000 mL (0 mLs Intravenous Stopped 09/22/23 1916)  naloxone (NARCAN) injection 0.1 mg (0.1 mg Intravenous Given 09/22/23 1920)  sodium chloride 0.9 % bolus 1,000 mL (1,000 mLs Intravenous New Bag/Given 09/22/23 1953)     IMPRESSION / MDM / ASSESSMENT AND PLAN / ED COURSE  I reviewed the triage vital signs and the nursing notes.                              Differential diagnosis includes, but is not limited to, dehydration, rhabdomyolysis, ICH, cervical spine injury  Patient's presentation is most consistent with acute complicated illness / injury requiring diagnostic workup.  Patient is a 68 year old female presenting today for ground-level fall and difficulty getting up.  Vital signs on arrival show tachycardia and patient was lethargic.  Prior history of intentional overdose with Suboxone and Xanax but she adamantly denies that here today.  No SI.  Laboratory workup most notable for elevated CK likely from her prolonged downtime.  No AKI and urine without blood to indicate obvious  rhabdomyolysis.  No muscular pain.  CT imaging of head and neck with no acute traumatic pathology.  Patient was given 2 L of fluid with improvement in her vital signs.  She was able to tolerate p.o. and ambulate without difficulty.  Patient wished to go home at this time.  I did discuss potential overnight observation with ongoing fluids and rechecking her CK.  She stated she preferred to go home at this time and will continue to hydrate at home.  Told to follow-up with PCP and given strict return precautions.  The patient is  on the cardiac monitor to evaluate for evidence of arrhythmia and/or significant heart rate changes. Clinical Course as of 09/22/23 2148  Mon Sep 22, 2023  2144 Ambulated without assistance. Tolerated PO. Wants to go home. Has received 2L fluids with improvement in vitals. Talking well. No acute complaints. Will DC with PCP follow up [DW]    Clinical Course User Index [DW] Janith Lima, MD     FINAL CLINICAL IMPRESSION(S) / ED DIAGNOSES   Final diagnoses:  Fall, initial encounter  Elevated CK  Dehydration     Rx / DC Orders   ED Discharge Orders     None        Note:  This document was prepared using Dragon voice recognition software and may include unintentional dictation errors.   Janith Lima, MD 09/22/23 2159

## 2023-09-22 NOTE — ED Notes (Signed)
First Nurse Note: Pt to ED via [facility] from home for a fall. Pt slid out of bed. Pt is not on blood thinners, pt did take a xanax 1 hour pta. Pt is in NAD.

## 2023-09-22 NOTE — ED Provider Triage Note (Signed)
Emergency Medicine Provider Triage Evaluation Note  Stephanie Skinner , a 68 y.o. female  was evaluated in triage.  Pt here via EMS for a fall that happened sometime last pm.  Unwitnessed fall.  Patient reports that she took a xanax this morning.  Unable to answer how long she was on the floor or if she got up on her own.  Review of Systems  Positive: Sleepy but arousable.  Cooperative Negative: Denies pain.  Physical Exam  BP 110/78 (BP Location: Left Arm)   Pulse (!) 117   Temp 98.5 F (36.9 C) (Oral)   Resp 17   SpO2 98%  Gen:   Awake, no distress  sleepy.  Answers questions with short 1-2 words.  Easily arousable.  Resp:  Normal effort  MSK:   Moves extremities without difficulty   No obvious deformity noted.   Other:  No asymmetry noted facial  on exam.    Medical Decision Making  Medically screening exam initiated at 2:50 PM.  Appropriate orders placed.  Stephanie Skinner was informed that the remainder of the evaluation will be completed by another provider, this initial triage assessment does not replace that evaluation, and the importance of remaining in the ED until their evaluation is complete.     Tommi Rumps, PA-C 09/22/23 1500

## 2023-09-22 NOTE — ED Notes (Addendum)
Pts emergency contact, Renaee Munda, called and left a voicemail to pick up pt.

## 2023-09-23 NOTE — ED Notes (Signed)
Pt ambulated in hallway with no assistance and was steady.

## 2024-02-22 ENCOUNTER — Other Ambulatory Visit: Payer: Self-pay

## 2024-02-22 ENCOUNTER — Emergency Department
Admission: EM | Admit: 2024-02-22 | Discharge: 2024-02-24 | Disposition: A | Discharge status: Other Acute Inpatient Hospital | Attending: Emergency Medicine | Admitting: Emergency Medicine

## 2024-02-22 DIAGNOSIS — F332 Major depressive disorder, recurrent severe without psychotic features: Secondary | ICD-10-CM | POA: Diagnosis not present

## 2024-02-22 DIAGNOSIS — R4587 Impulsiveness: Secondary | ICD-10-CM | POA: Diagnosis not present

## 2024-02-22 DIAGNOSIS — F17210 Nicotine dependence, cigarettes, uncomplicated: Secondary | ICD-10-CM | POA: Insufficient documentation

## 2024-02-22 DIAGNOSIS — G3184 Mild cognitive impairment, so stated: Secondary | ICD-10-CM | POA: Insufficient documentation

## 2024-02-22 DIAGNOSIS — F4329 Adjustment disorder with other symptoms: Secondary | ICD-10-CM

## 2024-02-22 DIAGNOSIS — F5105 Insomnia due to other mental disorder: Secondary | ICD-10-CM | POA: Diagnosis not present

## 2024-02-22 DIAGNOSIS — Z9152 Personal history of nonsuicidal self-harm: Secondary | ICD-10-CM | POA: Diagnosis not present

## 2024-02-22 DIAGNOSIS — F419 Anxiety disorder, unspecified: Secondary | ICD-10-CM | POA: Diagnosis not present

## 2024-02-22 DIAGNOSIS — G4701 Insomnia due to medical condition: Secondary | ICD-10-CM | POA: Diagnosis present

## 2024-02-22 DIAGNOSIS — F32A Depression, unspecified: Secondary | ICD-10-CM | POA: Insufficient documentation

## 2024-02-22 DIAGNOSIS — Z8616 Personal history of COVID-19: Secondary | ICD-10-CM | POA: Insufficient documentation

## 2024-02-22 DIAGNOSIS — R45851 Suicidal ideations: Secondary | ICD-10-CM | POA: Insufficient documentation

## 2024-02-22 DIAGNOSIS — R630 Anorexia: Secondary | ICD-10-CM | POA: Insufficient documentation

## 2024-02-22 DIAGNOSIS — I10 Essential (primary) hypertension: Secondary | ICD-10-CM | POA: Insufficient documentation

## 2024-02-22 DIAGNOSIS — R410 Disorientation, unspecified: Secondary | ICD-10-CM | POA: Insufficient documentation

## 2024-02-22 DIAGNOSIS — F4320 Adjustment disorder, unspecified: Secondary | ICD-10-CM

## 2024-02-22 LAB — URINALYSIS, W/ REFLEX TO CULTURE (INFECTION SUSPECTED)
Bilirubin Urine: NEGATIVE
Glucose, UA: NEGATIVE mg/dL
Hgb urine dipstick: NEGATIVE
Ketones, ur: NEGATIVE mg/dL
Nitrite: POSITIVE — AB
Protein, ur: NEGATIVE mg/dL
Specific Gravity, Urine: 1.008 (ref 1.005–1.030)
pH: 6 (ref 5.0–8.0)

## 2024-02-22 LAB — BASIC METABOLIC PANEL WITH GFR
Anion gap: 13 (ref 5–15)
BUN: 13 mg/dL (ref 8–23)
CO2: 22 mmol/L (ref 22–32)
Calcium: 9.7 mg/dL (ref 8.9–10.3)
Chloride: 96 mmol/L — ABNORMAL LOW (ref 98–111)
Creatinine, Ser: 0.72 mg/dL (ref 0.44–1.00)
GFR, Estimated: >60 mL/min (ref >60)
Glucose, Bld: 117 mg/dL — ABNORMAL HIGH (ref 70–99)
Potassium: 4 mmol/L (ref 3.5–5.1)
Sodium: 131 mmol/L — ABNORMAL LOW (ref 135–145)

## 2024-02-22 LAB — CBC WITH DIFFERENTIAL/PLATELET
Abs Immature Granulocytes: 0.07 10*3/uL (ref 0.00–0.07)
Basophils Absolute: 0 10*3/uL (ref 0.0–0.1)
Basophils Relative: 0 %
Eosinophils Absolute: 0 10*3/uL (ref 0.0–0.5)
Eosinophils Relative: 0 %
HCT: 43.1 % (ref 36.0–46.0)
Hemoglobin: 14.1 g/dL (ref 12.0–15.0)
Immature Granulocytes: 1 %
Lymphocytes Relative: 19 %
Lymphs Abs: 2.2 10*3/uL (ref 0.7–4.0)
MCH: 31.9 pg (ref 26.0–34.0)
MCHC: 32.7 g/dL (ref 30.0–36.0)
MCV: 97.5 fL (ref 80.0–100.0)
Monocytes Absolute: 0.6 10*3/uL (ref 0.1–1.0)
Monocytes Relative: 5 %
Neutro Abs: 8.7 10*3/uL — ABNORMAL HIGH (ref 1.7–7.7)
Neutrophils Relative %: 75 %
Platelets: 286 10*3/uL (ref 150–400)
RBC: 4.42 MIL/uL (ref 3.87–5.11)
RDW: 12.6 % (ref 11.5–15.5)
WBC: 11.5 10*3/uL — ABNORMAL HIGH (ref 4.0–10.5)
nRBC: 0 % (ref 0.0–0.2)

## 2024-02-22 LAB — URINE DRUG SCREEN, QUALITATIVE (ARMC ONLY)
Amphetamines, Ur Screen: NOT DETECTED
Barbiturates, Ur Screen: NOT DETECTED
Benzodiazepine, Ur Scrn: POSITIVE — AB
Cannabinoid 50 Ng, Ur ~~LOC~~: NOT DETECTED
Cocaine Metabolite,Ur ~~LOC~~: NOT DETECTED
MDMA (Ecstasy)Ur Screen: NOT DETECTED
Methadone Scn, Ur: NOT DETECTED
Opiate, Ur Screen: NOT DETECTED
Phencyclidine (PCP) Ur S: NOT DETECTED
Tricyclic, Ur Screen: POSITIVE — AB

## 2024-02-22 LAB — ETHANOL: Alcohol, Ethyl (B): <15 mg/dL (ref <15)

## 2024-02-22 LAB — TSH: TSH: 2.095 u[IU]/mL (ref 0.350–4.500)

## 2024-02-22 MED ORDER — FOSFOMYCIN TROMETHAMINE 3 G PO PACK
3.0000 g | PACK | Freq: Once | ORAL | Status: AC
  Administered 2024-02-22: 3 g via ORAL
  Filled 2024-02-22: qty 3

## 2024-02-22 MED ORDER — IBUPROFEN 600 MG PO TABS
600.0000 mg | ORAL_TABLET | Freq: Three times a day (TID) | ORAL | Status: DC | PRN
Start: 2024-02-22 — End: 2024-02-24
  Administered 2024-02-22 – 2024-02-24 (×3): 600 mg via ORAL
  Filled 2024-02-22 (×3): qty 1

## 2024-02-22 MED ORDER — ALUM & MAG HYDROXIDE-SIMETH 200-200-20 MG/5ML PO SUSP: 30.0000 mL | Freq: Four times a day (QID) | ORAL | Status: DC | PRN

## 2024-02-22 MED ORDER — ONDANSETRON HCL 4 MG PO TABS: 4.0000 mg | ORAL_TABLET | Freq: Three times a day (TID) | ORAL | Status: DC | PRN

## 2024-02-22 MED ORDER — TRAZODONE HCL 50 MG PO TABS
50.0000 mg | ORAL_TABLET | Freq: Every evening | ORAL | Status: DC | PRN
  Administered 2024-02-22 – 2024-02-23 (×2): 50 mg via ORAL
  Filled 2024-02-22 (×2): qty 1

## 2024-02-22 NOTE — ED Notes (Addendum)
 Patient given sunglasses from personal belongings due to light sensitivity

## 2024-02-22 NOTE — ED Provider Notes (Addendum)
 [facility] Provider Note    Event Date/Time   First MD Initiated Contact with Patient 02/22/24 1532     (approximate)   History   Chief Complaint: Psychiatric Evaluation   HPI  Stephanie Skinner is a 68 y.o. female with a history of hypertension, alcohol abuse who comes to the ED due to passive suicidal ideation.  Reports that since her husband died in 30-Nov-2024she has been alone, unable to come to terms with the loss.  She is having difficulty with sleeping, poor appetite, feels very depressed.  Tearful on encounter today.     Physical Exam   Triage Vital Signs: ED Triage Vitals  Encounter Vitals Group     BP 02/22/24 1526 134/62     Girls Systolic BP Percentile --      Girls Diastolic BP Percentile --      Boys Systolic BP Percentile --      Boys Diastolic BP Percentile --      Pulse Rate 02/22/24 1524 81     Resp 02/22/24 1524 20     Temp 02/22/24 1524 98 F (36.7 C)     Temp Source 02/22/24 1524 Oral     SpO2 02/22/24 1524 99 %     Weight 02/22/24 1525 130 lb (59 kg)     Height 02/22/24 1525 5' 5 (1.651 m)     Head Circumference --      Peak Flow --      Pain Score 02/22/24 1522 5     Pain Loc --      Pain Education --      Exclude from Growth Chart --     Most recent vital signs: Vitals:   02/22/24 1524 02/22/24 1526  BP:  134/62  Pulse: 81   Resp: 20   Temp: 98 F (36.7 C)   SpO2: 99%     General: Awake, no distress. CV:  Good peripheral perfusion.  Resp:  Normal effort.  Abd:  No distention.  Other:  No wounds.  Depressed affect   ED Results / Procedures / Treatments   Labs (all labs ordered are listed, but only abnormal results are displayed) Labs Reviewed  BASIC METABOLIC PANEL WITH GFR - Abnormal; Notable for the following components:      Result Value   Sodium 131 (*)    Chloride 96 (*)    Glucose, Bld 117 (*)    All other components within normal limits  CBC WITH DIFFERENTIAL/PLATELET - Abnormal;  Notable for the following components:   WBC 11.5 (*)    Neutro Abs 8.7 (*)    All other components within normal limits  URINALYSIS, W/ REFLEX TO CULTURE (INFECTION SUSPECTED) - Abnormal; Notable for the following components:   Color, Urine YELLOW (*)    APPearance HAZY (*)    Nitrite POSITIVE (*)    Leukocytes,Ua SMALL (*)    Bacteria, UA RARE (*)    All other components within normal limits  URINE DRUG SCREEN, QUALITATIVE ([facility] ONLY) - Abnormal; Notable for the following components:   Tricyclic, Ur Screen POSITIVE (*)    Benzodiazepine, Ur Scrn POSITIVE (*)    All other components within normal limits  ETHANOL  TSH     EKG    RADIOLOGY    PROCEDURES:  Procedures   MEDICATIONS ORDERED IN ED: Medications  ibuprofen  (ADVIL ) tablet 600 mg (has no administration in time range)  ondansetron  (ZOFRAN ) tablet 4 mg (has no administration  in time range)  alum & mag hydroxide-simeth (MAALOX/MYLANTA) 200-200-20 MG/5ML suspension 30 mL (has no administration in time range)     IMPRESSION / MDM / ASSESSMENT AND PLAN / ED COURSE  I reviewed the triage vital signs and the nursing notes.  Patient's presentation is most consistent with acute presentation with potential threat to life or bodily function.  Patient presents with symptoms of depression.  Not an imminent danger to herself or others.  Will consult psychiatry.  The patient has been placed in psychiatric observation due to the need to provide a safe environment for the patient while obtaining psychiatric consultation and evaluation, as well as ongoing medical and medication management to treat the patient's condition.  The patient has not been placed under full IVC at this time.   ----------------------------------------- 9:45 PM on 02/22/2024 ----------------------------------------- Initial urinalysis shows nitrate positive, concerning for UTI.  Will give single dose treatment with fosfomycin.  Otherwise medically  stable to proceed with psychiatric evaluation and disposition.     FINAL CLINICAL IMPRESSION(S) / ED DIAGNOSES   Final diagnoses:  Depression, unspecified depression type     Rx / DC Orders   ED Discharge Orders     None        Note:  This document was prepared using Dragon voice recognition software and may include unintentional dictation errors.   Viviann Pastor, MD 02/22/24 HANNA    Viviann Pastor, MD 02/22/24 [phone]

## 2024-02-22 NOTE — Consult Note (Signed)
 [facility] Psychiatric Consult Initial  Patient Name: Stephanie Skinner  MRN: [id number]  DOB: 1955/11/02  Consult Order details:  Orders (From admission, onward)     Start     Ordered   02/22/24 1542  CONSULT TO CALL ACT TEAM       Ordering Provider: Viviann Pastor, MD  Provider:  (Not yet assigned)  Question:  Reason for Consult?  Answer:  Psych consult   02/22/24 1541   02/22/24 1542  IP CONSULT TO PSYCHIATRY       Ordering Provider: Viviann Pastor, MD  Provider:  (Not yet assigned)  Question Answer Comment  Consult Timeframe URGENT - requires response within 12 hours   URGENT timeframe requires provider to provider communication, has the provider to provider communication been completed Yes   Reason for Consult? Consult for medication management   Contact phone number where the requesting provider can be reached [phone]      02/22/24 1541             Mode of Visit: Tele-visit Virtual Statement:TELE PSYCHIATRY ATTESTATION & CONSENT As the provider for this telehealth consult, I attest that I verified the patient's identity using two separate identifiers, introduced myself to the patient, provided my credentials, disclosed my location, and performed this encounter via a HIPAA-compliant, real-time, face-to-face, two-way, interactive audio and video platform and with the full consent and agreement of the patient (or guardian as applicable.) Patient physical location: [facility]. Telehealth provider physical location: home office in state of [place].   Video start time: 1659 Video end time: 1731    Psychiatry Consult Evaluation  Service Date: February 22, 2024 LOS:  LOS: 0 days  Chief Complaint I haven't been able to sleep since my partner passed away.  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 female admitted: [facility] to the [facility] 02/22/2024  3:32 PM for   Per ED Provider  Admission Assessment 02/22/2024@1532 :  Chief Complaint: Psychiatric Evaluation      HPI   Stephanie Skinner is a 68 y.o. female with a history of hypertension, alcohol abuse who comes to the ED due to passive suicidal ideation.  Reports that since her husband died in 2024/12/04she has been alone, unable to come to terms with the loss.  She is having difficulty with sleeping, poor appetite, feels very depressed.  Tearful on encounter today.  Psychiatric Assessment  She carries the psychiatric diagnoses of GAD, Opiate Abuse, Alcohol Abuse, Benzodiazepine withdrawal with delirium,MDD, Overdose and has a past medical history of  Acute respiratory failure,Frequent Falls, Anemia,  Malnutrition, GI hemorrhage.   Her current presentation of depressed mood, disrupted sleep, tearful and labile mood, following the death of her spouse in December 04, 2024is most consistent with grief reaction and recurrent MDD. She meets criteria for overnight observation and AM psychiatric reassessment d/t her rambling and incoherent speech, and inability to provide chronological historical information.  Patient has not slept more than 4 hours within the past 3 days which I suspect contributes to her presentation today.  Additionally, she has a complex hx for alcohol, opioid and benzodiazepine abuse, with recently benzo overdose 7 months ago where she was hospitalized at [facility] geri psych unit.  From a medical standing, her sodium is low at 131 and her WBC are slightly elevated at 11.5.  She would benefit from U/A to evaluate for UTI which could contribute to  confusion and mental decline.  Her current outpatient psychotropic medications include trazodone , olanzapine , dextroamphetamine , duloxetine and historically she has had a good response to these medications. As she presents mentally decompensated, it's difficult to determine if she was medication compliant prior to admission.   Please see plan below for detailed recommendations.    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)   Insomnia due to mental condition    Plan   ## Psychiatric Medication Recommendations:  Pending updated EKG to rule out prolonged QT intervals, plan to restart home medications  ## Medical Decision Making Capacity: Not specifically addressed in this encounter  ## Further Work-up:  -- EKG; UA to rule out UTI EKG or While pt on Qtc prolonging medications, please monitor & replete K+ to 4 and Mg2+ to 2 -- most recent EKG on 127 had QtC of 452 -- Pertinent labwork reviewed earlier this admission includes: CMP, CBC,   ## Disposition:-- overnight observation with emphasis placed on rest/sleep, followed by AM psychiatric reassessment.   ## Behavioral / Environmental: -Patient would benefit from more frequent contact with medical team to delineate plan of care and allow for clarification questions, which will help alleviate anxiety regarding treatment. If possible, try to check back in with the pt in the afternoon.    ## Safety and Observation Level:  - Based on my clinical evaluation, I estimate the patient to be at low risk of self harm in the current setting. - At this time, we recommend  routine. This decision is based on my review of the chart including patient's history and current presentation, interview of the patient, mental status examination, and consideration of suicide risk including evaluating suicidal ideation, plan, intent, suicidal or self-harm behaviors, risk factors, and protective factors. This judgment is based on our ability to directly address suicide risk, implement suicide prevention strategies, and develop a safety plan while the patient is in the clinical setting. Please contact our team if there is a concern that risk level has changed.  CSSR Risk Category:C-SSRS RISK CATEGORY: High  Risk  Suicide Risk Assessment: Patient has following modifiable risk factors for suicide: under treated depression , recklessness, recent psychiatric hospitalization, and recent loss (death, isolation, vocation), which we are addressing by recommending for overnight observation for safety monitoring and rest, followed by AM psychiatric reassessment. Patient has following non-modifiable or demographic risk factors for suicide: history of self harm behavior and psychiatric hospitalization Patient has the following protective factors against suicide: Access to outpatient mental health care  Thank you for this consult request. Recommendations have been communicated to the primary team.  We will continue to follow patient.   Bernadette FORBES Barefoot, NP       History of Present Illness  Relevant Aspects of Hospital ED Course:  Admitted on 02/22/2024 for  Per RN Triage note dated 02/22/2024@1531 :  Pt to ED with BPD for suicidal ideation. Husband died 16-Aug-2024 and stating I just need help, I'd rather just be dead   Patient Report:  Patient observed in interview room, seated in chair, legs crossed and drinking coffee.  The TTS counselor is also in the room. Patient greeted by psychiatric team and given anticipatory guidance.  She states the light hurts her eyes, she waits until she has has sunglasses to begin the assessment.  Patient  Patient reports she lost her of 23 years, in November of 2024.  She states  this is the first time she has lad to live alone and she's having adjustment concerns. With the exception of anxiety, she denies hx for mental illness. Per chart review, patient has a hx for significant psychiatric dx as well as prior psychiatric admission. She reports to see Dr. Laurier at [facility], last visit was 2 months ago but she no longer sees him.     She is alert and oriented to person, place only, otherwise, she appears disoriented and has difficulty stating today's date and her primary  reason for coming to the emergency department.  She is cooperative but she rambles and most of her speech is incoherent.  She denies SI, HI or AVH but does report feeling very sad and even depressed.    She reports she has not slept in 2 days because it's so depressing, I don't know why.    Psych ROS:  Depression: present Anxiety:  present Mania (lifetime and current): she denies Psychosis: (lifetime and current): she denies  Collateral information:  Patient advised calling her sister for collateral but she does not have the # and does not have a cell phone.  She then recommended obtaining her sister's # from her daughter whose # is listed in patient's chart.   Review of Systems  Constitutional: Negative.   HENT: Negative.    Eyes: Negative.   Respiratory: Negative.    Cardiovascular: Negative.   Gastrointestinal: Negative.   Genitourinary: Negative.   Musculoskeletal: Negative.   Skin: Negative.   Neurological: Negative.   Endo/Heme/Allergies: Negative.   Psychiatric/Behavioral:  Positive for depression. The patient is nervous/anxious and has insomnia.      Psychiatric and Social History  Psychiatric History:  Information collected from patient and chart review  Prev Dx/Sx: MDD, GAD, Benzodiazepine abuse and overdose, opiate abuse, alcohol abuse,  Current Psych Provider: Dr. Laurier as [facility] Meds (current): Patient mentally decompensates, appears exhausted and unable to provide accurate medication reconciliation.  Previous Med Trials: deferred Therapy: pt denies  Prior Psych Hospitalization: yes  Prior Self Harm: yes Prior Violence: denies  Family Psych History: unknown Family Hx suicide: unknown  Social History: deferred d/t patient unable to continue participation in assessment  Access to weapons/lethal means: she denies  I just want to stop the hurting that's inside of me.  She denies prior suicie  Substance History Alcohol: yes, but  pt offers incongruent information  Tobacco: she denies Illicit drugs: she denies  Exam Findings  Physical Exam:  Vital Signs:  Temp:  [98 F (36.7 C)] 98 F (36.7 C) (06/29 1524) Pulse Rate:  [81] 81 (06/29 1524) Resp:  [20] 20 (06/29 1524) BP: (134)/(62) 134/62 (06/29 1526) SpO2:  [99 %] 99 % (06/29 1524) Weight:  [59 kg] 59 kg (06/29 1525) Blood pressure 134/62, pulse 81, temperature 98 F (36.7 C), temperature source Oral, resp. rate 20, height 5' 5 (1.651 m), weight 59 kg, SpO2 99%. Body mass index is 21.63 kg/m.  Physical Exam Pulmonary:     Effort: Pulmonary effort is normal.   Musculoskeletal:        General: Normal range of motion.     Cervical back: Normal range of motion.   Neurological:     Mental Status: She is alert. She is disoriented.   Psychiatric:        Mood and Affect: Mood is anxious and depressed. Affect is labile and tearful.        Speech: Speech is rapid and pressured.  Behavior: Behavior is cooperative.        Cognition and Memory: Cognition is impaired. She exhibits impaired recent memory.        Judgment: Judgment is impulsive.    Mental Status Exam: General Appearance: Fairly Groomed  Orientation:  Other:  oriented to person and location only  Memory:  Immediate;   Poor Recent;   Poor Remote;   Poor  Concentration:  Concentration: Poor and Attention Span: Poor  Recall:  Poor  Attention  Poor  Eye Contact:  Minimal  Speech:  Pressured  Language:  Poor  Volume:  Decreased  Mood: Depressed   Affect:  Congruent, Depressed, Labile, and Tearful  Thought Process:  Disorganized  Thought Content:  Illogical and Rumination  Suicidal Thoughts:  No  Homicidal Thoughts:  No  Judgement:  Impaired  Insight:  Lacking  Psychomotor Activity:  Normal  Akathisia:  No  Fund of Knowledge:  Poor      Assets:  Desire for Improvement Financial Resources/Insurance Housing  Cognition:  Impaired,  Mild  ADL's:  Impaired  AIMS (if indicated):         Other History   These have been pulled in through the EMR, reviewed, and updated if appropriate.  Family History:  The patient's family history includes Colon cancer in her sister; Diabetes in her sister.  Medical History: Past Medical History:  Diagnosis Date   Anemia 05/24/2022   Arthritis    COVID-19    ETOH abuse    Hepatitis C    pt states she has been cured   Hypertension    Seizures (HCC)    not on meds    Surgical History: Past Surgical History:  Procedure Laterality Date   ABDOMINAL HYSTERECTOMY     partial hysterectomy   ELBOW SURGERY     ESOPHAGOGASTRODUODENOSCOPY (EGD) WITH PROPOFOL  N/A 05/24/2022   Procedure: ESOPHAGOGASTRODUODENOSCOPY (EGD) WITH PROPOFOL ;  Surgeon: Unk Corinn Skiff, MD;  Location: [facility] ENDOSCOPY;  Service: Gastroenterology;  Laterality: N/A;     Medications:   Current Facility-Administered Medications:    alum & mag hydroxide-simeth (MAALOX/MYLANTA) 200-200-20 MG/5ML suspension 30 mL, 30 mL, Oral, Q6H PRN, Viviann Pastor, MD   ibuprofen  (ADVIL ) tablet 600 mg, 600 mg, Oral, Q8H PRN, Viviann Pastor, MD   ondansetron  (ZOFRAN ) tablet 4 mg, 4 mg, Oral, Q8H PRN, Viviann Pastor, MD  Current Outpatient Medications:    ALPRAZolam  (XANAX ) 1 MG tablet, Take 1 tablet (1 mg total) by mouth 3 (three) times daily as needed for anxiety., Disp: 90 tablet, Rfl: 0   buprenorphine -naloxone  (SUBOXONE ) 8-2 mg SUBL SL tablet, Place 1 tablet under the tongue 2 (two) times daily., Disp: 60 tablet, Rfl: 0   butalbital -acetaminophen -caffeine  (FIORICET ) 50-325-40 MG tablet, Take 1 tablet by mouth every 6 (six) hours as needed for headache., Disp: 14 tablet, Rfl: 0   Cholecalciferol  50 MCG (2000 UT) CAPS, Take 1 tablet by mouth daily., Disp: , Rfl:    citalopram  (CELEXA ) 40 MG tablet, Take 1 tablet (40 mg total) by mouth daily., Disp: 30 tablet, Rfl: 3   cloNIDine  (CATAPRES ) 0.2 MG tablet, Take 1 tablet (0.2 mg total) by mouth 2 (two) times daily at  8 am and 4 pm., Disp: 60 tablet, Rfl: 3   furosemide  (LASIX ) 20 MG tablet, Take 1 tablet (20 mg total) by mouth daily after breakfast., Disp: 30 tablet, Rfl: 3   Multiple Vitamin (MULTIVITAMIN WITH MINERALS) TABS tablet, Take 1 tablet by mouth daily. (Patient not taking: Reported on 07/13/2023), Disp:  30 tablet, Rfl: 0   OLANZapine  (ZYPREXA ) 10 MG tablet, Take 1 tablet (10 mg total) by mouth at bedtime., Disp: 30 tablet, Rfl: 3   pantoprazole  (PROTONIX ) 40 MG tablet, Take 1 tablet (40 mg total) by mouth daily., Disp: 30 tablet, Rfl: 3   traZODone  (DESYREL ) 50 MG tablet, Take 1 tablet (50 mg total) by mouth at bedtime as needed for sleep., Disp: 30 tablet, Rfl: 3  Allergies: Allergies  Allergen Reactions   Codeine Nausea And Vomiting   Morphine And Codeine Other (See Comments)    Seizure    Sulfa Antibiotics Nausea And Vomiting    Bernadette FORBES Barefoot, NP

## 2024-02-22 NOTE — ED Notes (Signed)
 Meal tray provided.

## 2024-02-22 NOTE — BH Assessment (Signed)
 Comprehensive Clinical Assessment (CCA) Screening, Triage and Referral Note  02/22/2024 Stephanie Skinner [id number]  Chief Complaint:  Chief Complaint  Patient presents with   Psychiatric Evaluation   Visit Diagnosis: Depressive Disorder  Stephanie Skinner is a 68 year old female who presents to the ER, because she has had an increase in the symptoms of her depression and anxiety. Her husband passed away 07-22-2023. They were together for years and this is the first time she has lived alone. She also hasn't had her medication to address her mental health needs. Her outpatient provider no longer work at the agency, which has resulted in her not getting her refill of medications. She further reports she hasn't slept in three days. During the interview, the patient attempted to participate. However, she was unable to complete her sentences and thoughts. She would rub her head and voice frustration about being unable to think. After doing this several times, she shared how she hasn't slept. Patient denies SI/HI and AV/H.  Patient Reported Information How did you hear about us ? Self  What Is the Reason for Your Visit/Call Today? Patient have an increase symptom of depression, and haven't had her mental health medications.  How Long Has This Been Causing You Problems? 1 wk - 1 month  What Do You Feel Would Help You the Most Today? Treatment for Depression or other mood problem   Have You Recently Had Any Thoughts About Hurting Yourself? Yes  Are You Planning to Commit Suicide/Harm Yourself At This time? No   Have you Recently Had Thoughts About Hurting Someone Stephanie Skinner? No  Are You Planning to Harm Someone at This Time? No  Explanation: No data recorded  Have You Used Any Alcohol or Drugs in the Past 24 Hours? Yes  How Long Ago Did You Use Drugs or Alcohol? Several days ago.  What Did You Use and How Much? Alcohol   Do You Currently Have a Therapist/Psychiatrist? No  Name of  Therapist/Psychiatrist: No data recorded  Have You Been Recently Discharged From Any Office Practice or Programs? No  Explanation of Discharge From Practice/Program: No data recorded   CCA Screening Triage Referral Assessment Type of Contact: Face-to-Face  Telemedicine Service Delivery:   Is this Initial or Reassessment?   Date Telepsych consult ordered in [facility]:    Time Telepsych consult ordered in [facility]:    [facility] of Assessment: [facility]  Provider Location: [facility] ED    Collateral Involvement: No data recorded  Does Patient Have a Court Appointed Legal Guardian? No data recorded Name and Contact of Legal Guardian: No data recorded If Minor and Not Living with Parent(s), Who has Custody? No data recorded Is CPS involved or ever been involved? Never  Is APS involved or ever been involved? Never   Patient Determined To Be At Risk for Harm To Self or Others Based on Review of Patient Reported Information or Presenting Complaint? Yes, for Self-Harm  Method: No data recorded Availability of Means: No data recorded Intent: No data recorded Notification Required: No data recorded Additional Information for Danger to Others Potential: No data recorded Additional Comments for Danger to Others Potential: No data recorded Are There Guns or Other Weapons in Your Home? No  Types of Guns/Weapons: No data recorded Are These Weapons Safely Secured?                            No data recorded Who Could Verify You Are Able  To Have These Secured: No data recorded Do You Have any Outstanding Charges, Pending Court Dates, Parole/Probation? No data recorded Contacted To Inform of Risk of Harm To Self or Others: No data recorded  Does Patient Present under Involuntary Commitment? No   County of Residence: [place]   Patient Currently Receiving the Following Services: Not Receiving Services   Determination of Need: Emergent (2 hours)   Options For Referral: ED Referral   Disposition  Recommendation per psychiatric provider: [place] Stephanie Skinner Stephanie Skinner, Stephanie Skinner, Stephanie Skinner, Stephanie Skinner Therapeutic Triage Specialist 02/22/2024 5:50 PM

## 2024-02-22 NOTE — ED Triage Notes (Signed)
 Pt to ED with BPD for suicidal ideation. Husband died 07-21-2024 and stating I just need help, I'd rather just be dead.

## 2024-02-23 MED ORDER — OLANZAPINE 10 MG PO TABS
10.0000 mg | ORAL_TABLET | Freq: Every day | ORAL | Status: DC
  Administered 2024-02-23: 10 mg via ORAL
  Filled 2024-02-23: qty 1

## 2024-02-23 MED ORDER — DULOXETINE HCL 30 MG PO CPEP
30.0000 mg | ORAL_CAPSULE | Freq: Two times a day (BID) | ORAL | Status: DC
  Administered 2024-02-23: 30 mg via ORAL
  Filled 2024-02-23 (×2): qty 1

## 2024-02-23 MED ORDER — BUTALBITAL-APAP-CAFFEINE 50-325-40 MG PO TABS: 1.0000 | ORAL_TABLET | Freq: Four times a day (QID) | ORAL | Status: DC | PRN

## 2024-02-23 MED ORDER — CITALOPRAM HYDROBROMIDE 20 MG PO TABS
40.0000 mg | ORAL_TABLET | Freq: Every day | ORAL | Status: DC
  Administered 2024-02-23 – 2024-02-24 (×2): 40 mg via ORAL
  Filled 2024-02-23 (×2): qty 2

## 2024-02-23 MED ORDER — ALPRAZOLAM 0.5 MG PO TABS
1.0000 mg | ORAL_TABLET | Freq: Three times a day (TID) | ORAL | Status: DC | PRN
  Administered 2024-02-23: 1 mg via ORAL
  Filled 2024-02-23: qty 2

## 2024-02-23 MED ORDER — VITAMIN D 25 MCG (1000 UNIT) PO TABS
2000.0000 [IU] | ORAL_TABLET | Freq: Every day | ORAL | Status: DC
  Administered 2024-02-23 – 2024-02-24 (×2): 2000 [IU] via ORAL
  Filled 2024-02-23 (×2): qty 2

## 2024-02-23 MED ORDER — ALPRAZOLAM 0.5 MG PO TABS
1.0000 mg | ORAL_TABLET | Freq: Once | ORAL | Status: AC
  Administered 2024-02-23: 1 mg via ORAL
  Filled 2024-02-23: qty 2

## 2024-02-23 MED ORDER — BUPRENORPHINE HCL-NALOXONE HCL 8-2 MG SL SUBL
1.0000 | SUBLINGUAL_TABLET | Freq: Two times a day (BID) | SUBLINGUAL | Status: DC
  Administered 2024-02-23 – 2024-02-24 (×3): 1 via SUBLINGUAL
  Filled 2024-02-23 (×3): qty 1

## 2024-02-23 MED ORDER — FUROSEMIDE 20 MG PO TABS
20.0000 mg | ORAL_TABLET | Freq: Every day | ORAL | Status: DC
  Administered 2024-02-24: 20 mg via ORAL
  Filled 2024-02-23: qty 1

## 2024-02-23 MED ORDER — CLONIDINE HCL 0.1 MG PO TABS
0.2000 mg | ORAL_TABLET | ORAL | Status: DC
  Administered 2024-02-24 (×2): 0.2 mg via ORAL
  Filled 2024-02-23 (×2): qty 2

## 2024-02-23 NOTE — ED Notes (Signed)
 Pt. To BHU from ED ambulatory without difficulty, to room  BHU 2. Report from [facility]. Pt. Is alert and oriented, warm and dry in no distress. Pt. Denies SI, HI, and AVH. Pt. Calm and cooperative. Pt. Made aware of security cameras and Q15 minute rounds. Pt. Encouraged to let Nursing staff know of any concerns or needs.    ENVIRONMENTAL ASSESSMENT Potentially harmful objects out of patient reach: Yes.   Personal belongings secured: Yes.   Patient dressed in hospital provided attire only: Yes.   Plastic bags out of patient reach: Yes.   Patient care equipment (cords, cables, call bells, lines, and drains) shortened, removed, or accounted for: Yes.   Equipment and supplies removed from bottom of stretcher: Yes.   Potentially toxic materials out of patient reach: Yes.   Sharps container removed or out of patient reach: Yes.

## 2024-02-23 NOTE — ED Notes (Signed)
 Pt was given breakfast at the bedside.

## 2024-02-23 NOTE — ED Notes (Signed)
 Pt was given lunch at bedside

## 2024-02-23 NOTE — ED Notes (Signed)
VOL  PENDING  PLACEMENT 

## 2024-02-23 NOTE — ED Provider Notes (Signed)
 Emergency Medicine Observation Re-evaluation Note  Stephanie Skinner is a 68 y.o. female, seen on rounds today.  Pt initially presented to the ED for complaints of Psychiatric Evaluation Currently, the patient is resting, voices no medical complaints.  Physical Exam  BP 134/62   Pulse 81   Temp 98 F (36.7 C) (Oral)   Resp 20   Ht 5' 5 (1.651 m)   Wt 59 kg   SpO2 99%   BMI 21.63 kg/m  Physical Exam General: Resting in no acute distress Cardiac: No cyanosis Lungs: Equal rise and fall Psych: Not agitated  ED Course / MDM  EKG:   I have reviewed the labs performed to date as well as medications administered while in observation.  Recent changes in the last 24 hours include no events overnight.  Plan  Current plan is for psychiatric disposition.    Akylah Hascall J, MD 02/23/24 [phone]

## 2024-02-23 NOTE — ED Notes (Signed)
 This tech obtained vital signs on pt.

## 2024-02-23 NOTE — Progress Notes (Signed)
 [facility] Consult Note  Patient Name: Stephanie Skinner MRN: [id number] DOB: 04/13/1956 DATE OF Consult: 02/23/2024  PRIMARY PSYCHIATRIC DIAGNOSES  1.  Major depressive disorder, recurrent, severe, without psychotic features.   RECOMMENDATIONS  Recommendations: Medication recommendations:   Patient reports having been started on duloxetine but she discontinued it because of severe headache.  For the past 2 days she has received both duloxetine and citalopram .  I recommended discontinuing duloxetine and giving her citalopram  alone.  Both medications are putting her wrist of serotonin syndrome.  Continue all other medications as prescribed. for panic attacks, anxiety or agitation, recommend using lorazepam  1 mg every 6 hours as needed instead of Xanax .  Lorazepam  is longer acting and has lower risk of rebound anxiety. Is inpatient psychiatric hospitalization recommended for this patient? Yes (Explain why):   Patient is severely depressed, not safe to return home alone   Without further care. Follow-Up Telepsychiatry C/L services: We will sign off for now. Please re-consult our service if needed for any concerning changes in the patient's condition, discharge planning, or questions.  Thank you for involving us  in the care of this patient. If you have any additional questions or concerns, please call [phone] and ask for me or the provider on-call.  TELEPSYCHIATRY ATTESTATION & CONSENT  As the provider for this telehealth consult, I attest that I verified the patient's identity using two separate identifiers, introduced myself to the patient, provided my credentials, disclosed my location, and performed this encounter via a HIPAA-compliant, real-time, face-to-face, two-way, interactive audio and video platform and with the full consent and agreement of the patient (or guardian as applicable.)  Patient physical location: [facility] Emergency Department at [facility] . Telehealth provider  physical location: home office in state of [place] .  Video start time: 8 pm CST (Central Time) Video end time: 8:30 pm CST (Central Time)  IDENTIFYING DATA  Stephanie Skinner is a 68 y.o. year-old female for whom a psychiatric consultation has been ordered by the primary provider. The patient was identified using two separate identifiers.  CHIEF COMPLAINT/REASON FOR CONSULT  Depression, DI   HISTORY OF PRESENT ILLNESS (HPI)  The patient   Is a 68 year old female, with depression and anxiety, who has been struggling significantly since the death of her husband in 2023-07-07.  At that time, he passed away in hospice and she had a suicide attempt the following day by overdosing on her medications.  Since then, she had been on citalopram  and  olanzapine .  Her longtime psychiatrist retired and the new provider she signed a clinic switched her to duloxetine instead.  She reports having stopped taking it because it gave her horrible migraines that she had never had before. Since the passing of her husband she has been home alone.  Says she has never lived alone before in her life.  She has a friend who she calls occasionally but nobody checks in on her.  During my assessment, she is tearful, stares at the screening for prolonged periods of time, says she wants to die and be with her husband but denies having a plan.  She is agreeable to inpatient admission for safety and stabilization.SABRA  PAST PSYCHIATRIC HISTORY   Otherwise as per HPI above.  PAST MEDICAL HISTORY  Past Medical History:  Diagnosis Date   Anemia 05/24/2022   Arthritis    COVID-19    ETOH abuse    Hepatitis C    pt states she has been cured   Hypertension  Seizures (HCC)    not on meds     HOME MEDICATIONS  Facility Ordered Medications  Medication   ibuprofen  (ADVIL ) tablet 600 mg   ondansetron  (ZOFRAN ) tablet 4 mg   alum & mag hydroxide-simeth (MAALOX/MYLANTA) 200-200-20 MG/5ML suspension 30 mL   traZODone  (DESYREL ) tablet  50 mg   [COMPLETED] fosfomycin (MONUROL) packet 3 g   [COMPLETED] ALPRAZolam  (XANAX ) tablet 1 mg   OLANZapine  (ZYPREXA ) tablet 10 mg   [START ON 02/24/2024] furosemide  (LASIX ) tablet 20 mg   cloNIDine  (CATAPRES ) tablet 0.2 mg   citalopram  (CELEXA ) tablet 40 mg   cholecalciferol  (VITAMIN D3) 25 MCG (1000 UNIT) tablet 2,000 Units   butalbital -acetaminophen -caffeine  (FIORICET ) 50-325-40 MG per tablet 1 tablet   buprenorphine -naloxone  (SUBOXONE ) 8-2 mg per SL tablet 1 tablet   ALPRAZolam  (XANAX ) tablet 1 mg   PTA Medications  Medication Sig   buprenorphine -naloxone  (SUBOXONE ) 8-2 mg SUBL SL tablet Place 1 tablet under the tongue 2 (two) times daily.   cloNIDine  (CATAPRES ) 0.2 MG tablet Take 1 tablet (0.2 mg total) by mouth 2 (two) times daily at 8 am and 4 pm.   butalbital -acetaminophen -caffeine  (FIORICET ) 50-325-40 MG tablet Take 1 tablet by mouth every 6 (six) hours as needed for headache.   ALPRAZolam  (XANAX ) 1 MG tablet Take 1 tablet (1 mg total) by mouth 3 (three) times daily as needed for anxiety.   OLANZapine  (ZYPREXA ) 10 MG tablet Take 1 tablet (10 mg total) by mouth at bedtime.   traZODone  (DESYREL ) 50 MG tablet Take 1 tablet (50 mg total) by mouth at bedtime as needed for sleep.   citalopram  (CELEXA ) 40 MG tablet Take 1 tablet (40 mg total) by mouth daily.   DULoxetine (CYMBALTA) 30 MG capsule Take 30 mg by mouth 2 (two) times daily.   Cholecalciferol  50 MCG (2000 UT) CAPS Take 1 tablet by mouth daily.   Multiple Vitamin (MULTIVITAMIN WITH MINERALS) TABS tablet Take 1 tablet by mouth daily. (Patient not taking: Reported on 07/13/2023)   furosemide  (LASIX ) 20 MG tablet Take 1 tablet (20 mg total) by mouth daily after breakfast.   pantoprazole  (PROTONIX ) 40 MG tablet Take 1 tablet (40 mg total) by mouth daily. (Patient not taking: Reported on 02/23/2024)   chlordiazePOXIDE  (LIBRIUM ) 25 MG capsule Take by mouth. (Patient not taking: Reported on 02/23/2024)     ALLERGIES  Allergies   Allergen Reactions   Codeine Nausea And Vomiting   Morphine And Codeine Other (See Comments)    Seizure    Sulfa Antibiotics Nausea And Vomiting    SOCIAL & SUBSTANCE USE HISTORY  Social History   Socioeconomic History   Marital status: Divorced    Spouse name: Not on file   Number of children: Not on file   Years of education: Not on file   Highest education level: Not on file  Occupational History   Not on file  Tobacco Use   Smoking status: Every Day    Current packs/day: 1.00    Types: Cigarettes   Smokeless tobacco: Never  Vaping Use   Vaping status: Every Day  Substance and Sexual Activity   Alcohol use: Yes    Comment: occ   Drug use: No   Sexual activity: Not Currently  Other Topics Concern   Not on file  Social History Narrative   Not on file   Social Drivers of Health   Financial Resource Strain: Low Risk  (05/17/2022)   Received from [facility]   Overall Financial Resource Strain (CARDIA)  Difficulty of Paying Living Expenses: Not hard at all  Food Insecurity: No Food Insecurity (07/15/2023)   Hunger Vital Sign    Worried About Running Out of Food in the Last Year: Never true    Ran Out of Food in the Last Year: Never true  Transportation Needs: No Transportation Needs (07/15/2023)   PRAPARE - Administrator, Civil Service (Medical): No    Lack of Transportation (Non-Medical): No  Physical Activity: Not on file  Stress: Not on file  Social Connections: Not on file   Social History   Tobacco Use  Smoking Status Every Day   Current packs/day: 1.00   Types: Cigarettes  Smokeless Tobacco Never   Social History   Substance and Sexual Activity  Alcohol Use Yes   Comment: occ   Social History   Substance and Sexual Activity  Drug Use No     FAMILY HISTORY  Family History  Problem Relation Age of Onset   Diabetes Sister    Colon cancer Sister     MENTAL STATUS EXAM (MSE)  Mental Status Exam: General Appearance:  Casual and Disheveled  Orientation:  Full (Time, Place, and Person)  Memory:  Immediate;   Good  Concentration:  Concentration: Good  Recall:  Good  Attention  Good  Eye Contact:  Fair  Speech:  Slow  Language:  Fair  Volume:  Normal  Mood: depressed   Affect:  Labile  Thought Process:  Coherent  Thought Content:  Logical  Suicidal Thoughts:  Yes.  without intent/plan  Homicidal Thoughts:  No  Judgement:  Fair  Insight:  Fair  Psychomotor Activity:  Decreased  Akathisia:  No  Fund of Knowledge:  Fair    Assets:  Manufacturing systems engineer Housing  Cognition:  WNL  ADL's:  Intact  AIMS (if indicated):       VITALS  Blood pressure 108/67, pulse 67, temperature 98.2 F (36.8 C), temperature source Oral, resp. rate 18, height 5' 5 (1.651 m), weight 59 kg, SpO2 94%.  LABS  Admission on 02/22/2024  Component Date Value Ref Range Status   Sodium 02/22/2024 131 (L)  135 - 145 mmol/L Final   Potassium 02/22/2024 4.0  3.5 - 5.1 mmol/L Final   Chloride 02/22/2024 96 (L)  98 - 111 mmol/L Final   CO2 02/22/2024 22  22 - 32 mmol/L Final   Glucose, Bld 02/22/2024 117 (H)  70 - 99 mg/dL Final   Glucose reference range applies only to samples taken after fasting for at least 8 hours.   BUN 02/22/2024 13  8 - 23 mg/dL Final   Creatinine, Ser 02/22/2024 0.72  0.44 - 1.00 mg/dL Final   Calcium 93/70/7974 9.7  8.9 - 10.3 mg/dL Final   GFR, Estimated 02/22/2024 >60  >60 mL/min Final   Comment: (NOTE) Calculated using the CKD-EPI Creatinine Equation (2021)    Anion gap 02/22/2024 13  5 - 15 Final   Performed at [facility], [address] [place], [place] [address]   WBC 02/22/2024 11.5 (H)  4.0 - 10.5 K/uL Final   RBC 02/22/2024 4.42  3.87 - 5.11 MIL/uL Final   Hemoglobin 02/22/2024 14.1  12.0 - 15.0 g/dL Final   HCT 93/70/7974 43.1  36.0 - 46.0 % Final   MCV 02/22/2024 97.5  80.0 - 100.0 fL Final   MCH 02/22/2024 31.9  26.0 - 34.0 pg Final   MCHC 02/22/2024 32.7  30.0 - 36.0  g/dL Final   RDW  02/22/2024 12.6  11.5 - 15.5 % Final   Platelets 02/22/2024 286  150 - 400 K/uL Final   nRBC 02/22/2024 0.0  0.0 - 0.2 % Final   Neutrophils Relative % 02/22/2024 75  % Final   Neutro Abs 02/22/2024 8.7 (H)  1.7 - 7.7 K/uL Final   Lymphocytes Relative 02/22/2024 19  % Final   Lymphs Abs 02/22/2024 2.2  0.7 - 4.0 K/uL Final   Monocytes Relative 02/22/2024 5  % Final   Monocytes Absolute 02/22/2024 0.6  0.1 - 1.0 K/uL Final   Eosinophils Relative 02/22/2024 0  % Final   Eosinophils Absolute 02/22/2024 0.0  0.0 - 0.5 K/uL Final   Basophils Relative 02/22/2024 0  % Final   Basophils Absolute 02/22/2024 0.0  0.0 - 0.1 K/uL Final   Immature Granulocytes 02/22/2024 1  % Final   Abs Immature Granulocytes 02/22/2024 0.07  0.00 - 0.07 K/uL Final   Performed at [facility], [address] [place], [place] [address]   Alcohol, Ethyl (B) 02/22/2024 <15  <15 mg/dL Final   Comment: (NOTE) For medical purposes only. Performed at [facility], [address] [place], [place] [address]    TSH 02/22/2024 2.095  0.350 - 4.500 uIU/mL Final   Comment: Performed by a 3rd Generation assay with a functional sensitivity of <=0.01 uIU/mL. Performed at [facility], [address] [place], [place] [address]    Specimen Source 02/22/2024 URINE, CLEAN CATCH   Final   Color, Urine 02/22/2024 YELLOW (A)  YELLOW Final   APPearance 02/22/2024 HAZY (A)  CLEAR Final   Specific Gravity, Urine 02/22/2024 1.008  1.005 - 1.030 Final   pH 02/22/2024 6.0  5.0 - 8.0 Final   Glucose, UA 02/22/2024 NEGATIVE  NEGATIVE mg/dL Final   Hgb urine dipstick 02/22/2024 NEGATIVE  NEGATIVE Final   Bilirubin Urine 02/22/2024 NEGATIVE  NEGATIVE Final   Ketones, ur 02/22/2024 NEGATIVE  NEGATIVE mg/dL Final   Protein, ur 93/70/7974 NEGATIVE  NEGATIVE mg/dL Final   Nitrite 93/70/7974 POSITIVE (A)  NEGATIVE Final   Leukocytes,Ua 02/22/2024 SMALL (A)  NEGATIVE Final   RBC / HPF 02/22/2024  0-5  0 - 5 RBC/hpf Final   WBC, UA 02/22/2024 6-10  0 - 5 WBC/hpf Final   Comment:        Reflex urine culture not performed if WBC <=10, OR if Squamous epithelial cells >5. If Squamous epithelial cells >5 suggest recollection.    Bacteria, UA 02/22/2024 RARE (A)  NONE SEEN Final   Squamous Epithelial / HPF 02/22/2024 0-5  0 - 5 /HPF Final   Mucus 02/22/2024 PRESENT   Final   Performed at [facility], [address] [place], [place] [address]   Tricyclic, Ur Screen 02/22/2024 POSITIVE (A)  NONE DETECTED Final   Amphetamines, Ur Screen 02/22/2024 NONE DETECTED  NONE DETECTED Final   MDMA (Ecstasy)Ur Screen 02/22/2024 NONE DETECTED  NONE DETECTED Final   Cocaine Metabolite,Ur [place] 02/22/2024 NONE DETECTED  NONE DETECTED Final   Opiate, Ur Screen 02/22/2024 NONE DETECTED  NONE DETECTED Final   Phencyclidine (PCP) Ur S 02/22/2024 NONE DETECTED  NONE DETECTED Final   Cannabinoid 50 Ng, Ur [place] 02/22/2024 NONE DETECTED  NONE DETECTED Final   Barbiturates, Ur Screen 02/22/2024 NONE DETECTED  NONE DETECTED Final   Benzodiazepine, Ur Scrn 02/22/2024 POSITIVE (A)  NONE DETECTED Final   Methadone Scn, Ur 02/22/2024 NONE DETECTED  NONE DETECTED Final   Comment: (NOTE) Tricyclics + metabolites, urine  Cutoff 1000 ng/mL Amphetamines + metabolites, urine  Cutoff 1000 ng/mL MDMA (Ecstasy), urine              Cutoff 500 ng/mL Cocaine Metabolite, urine          Cutoff 300 ng/mL Opiate + metabolites, urine        Cutoff 300 ng/mL Phencyclidine (PCP), urine         Cutoff 25 ng/mL Cannabinoid, urine                 Cutoff 50 ng/mL Barbiturates + metabolites, urine  Cutoff 200 ng/mL Benzodiazepine, urine              Cutoff 200 ng/mL Methadone, urine                   Cutoff 300 ng/mL  The urine drug screen provides only a preliminary, unconfirmed analytical test result and should not be used for non-medical purposes. Clinical consideration and professional judgment should be applied to any  positive drug screen result due to possible interfering substances. A more specific alternate chemical method must be used in order to obtain a confirmed analytical result. Gas chromatography / mass spectrometry (GC/MS) is the preferred confirm                          atory method. Performed at [facility], [address] [place], [place] [address]     PSYCHIATRIC REVIEW OF SYSTEMS (ROS)  ROS: Notable for the following relevant positive findings: ROS  Additional findings:      Musculoskeletal: No abnormal movements observed      Gait & Station: Normal      Pain Screening: Denies   RISK FORMULATION/ASSESSMENT  Is the patient experiencing any suicidal or homicidal ideations: Yes       Explain if yes: no plan or intent.  I want this to be over and be with my husband  Protective factors considered for safety management: help seeking   Risk factors/concerns considered for safety management:  Depression Recent loss  Is there a safety management plan with the patient and treatment team to minimize risk factors and promote protective factors: Yes           Explain: Inpatient admission  Is crisis care placement or psychiatric hospitalization recommended: Yes     Based on my current evaluation and risk assessment, patient is determined at this time to be at:  High risk  *RISK ASSESSMENT Risk assessment is a dynamic process; it is possible that this patient's condition, and risk level, may change. This should be re-evaluated and managed over time as appropriate. Please re-consult psychiatric consult services if additional assistance is needed in terms of risk assessment and management. If your team decides to discharge this patient, please advise the patient how to best access emergency psychiatric services, or to call 911, if their condition worsens or they feel unsafe in any way.   Berle JAYSON Gibney, MD Telepsychiatry Consult ServicesPatient ID: Stephanie Skinner, female   DOB:  03/19/1956, 68 y.o.   MRN: [id number]

## 2024-02-23 NOTE — ED Notes (Signed)
 Pt transported to [facility] after report given to Luke, Charity fundraiser. Security called and pt checked with security wand prior to escort to [place]. Pt ABCs intact. RR even and unlabored. Pt in NAD. Denies needs at this time.

## 2024-02-23 NOTE — Progress Notes (Addendum)
 [facility] received a call from Macario Sabot [phone]), team lead of the [facility] Team.  Macario stated that they received an order for this patient on yesterday (6/29), however patient does not meet criteria for the ACT team diagnostically.  In addition, Macario reports that patient does not live in their catchment area.  [place], [facility] [phone]

## 2024-02-23 NOTE — ED Notes (Signed)
 Patient reports feeling anxious and scared, MD notified see MAR for med.  Patient given support and encouragement.

## 2024-02-23 NOTE — ED Notes (Signed)
 Pt was asked about taking a shower and she refused.

## 2024-02-24 ENCOUNTER — Other Ambulatory Visit: Payer: Self-pay

## 2024-02-24 ENCOUNTER — Encounter: Payer: Self-pay | Admitting: Psychiatry

## 2024-02-24 ENCOUNTER — Inpatient Hospital Stay
Admission: AD | Admit: 2024-02-24 | Discharge: 2024-03-01 | DRG: 885 | Disposition: A | Discharge status: Home / Self Care | Source: Intra-hospital | Attending: Psychiatry | Admitting: Psychiatry

## 2024-02-24 DIAGNOSIS — Z90711 Acquired absence of uterus with remaining cervical stump: Secondary | ICD-10-CM

## 2024-02-24 DIAGNOSIS — I10 Essential (primary) hypertension: Secondary | ICD-10-CM | POA: Diagnosis present

## 2024-02-24 DIAGNOSIS — K219 Gastro-esophageal reflux disease without esophagitis: Secondary | ICD-10-CM | POA: Diagnosis present

## 2024-02-24 DIAGNOSIS — R45851 Suicidal ideations: Secondary | ICD-10-CM | POA: Diagnosis present

## 2024-02-24 DIAGNOSIS — F419 Anxiety disorder, unspecified: Secondary | ICD-10-CM | POA: Diagnosis present

## 2024-02-24 DIAGNOSIS — Z833 Family history of diabetes mellitus: Secondary | ICD-10-CM

## 2024-02-24 DIAGNOSIS — G4700 Insomnia, unspecified: Secondary | ICD-10-CM | POA: Diagnosis present

## 2024-02-24 DIAGNOSIS — Z79899 Other long term (current) drug therapy: Secondary | ICD-10-CM

## 2024-02-24 DIAGNOSIS — Z5982 Transportation insecurity: Secondary | ICD-10-CM | POA: Diagnosis not present

## 2024-02-24 DIAGNOSIS — Z5941 Food insecurity: Secondary | ICD-10-CM

## 2024-02-24 DIAGNOSIS — Z800 Family history of malignant neoplasm of digestive organs: Secondary | ICD-10-CM

## 2024-02-24 DIAGNOSIS — Z9151 Personal history of suicidal behavior: Secondary | ICD-10-CM

## 2024-02-24 DIAGNOSIS — F17210 Nicotine dependence, cigarettes, uncomplicated: Secondary | ICD-10-CM | POA: Diagnosis present

## 2024-02-24 DIAGNOSIS — Z1152 Encounter for screening for COVID-19: Secondary | ICD-10-CM

## 2024-02-24 DIAGNOSIS — Z882 Allergy status to sulfonamides status: Secondary | ICD-10-CM | POA: Diagnosis not present

## 2024-02-24 DIAGNOSIS — F332 Major depressive disorder, recurrent severe without psychotic features: Principal | ICD-10-CM | POA: Diagnosis present

## 2024-02-24 DIAGNOSIS — F17290 Nicotine dependence, other tobacco product, uncomplicated: Secondary | ICD-10-CM | POA: Diagnosis present

## 2024-02-24 DIAGNOSIS — Z885 Allergy status to narcotic agent status: Secondary | ICD-10-CM

## 2024-02-24 LAB — SARS CORONAVIRUS 2 BY RT PCR: SARS Coronavirus 2 by RT PCR: NEGATIVE

## 2024-02-24 MED ORDER — ACETAMINOPHEN 325 MG PO TABS
650.0000 mg | ORAL_TABLET | Freq: Four times a day (QID) | ORAL | Status: DC | PRN
  Administered 2024-02-25 – 2024-03-01 (×7): 650 mg via ORAL
  Filled 2024-02-24 (×7): qty 2

## 2024-02-24 MED ORDER — OLANZAPINE 10 MG IM SOLR: 5.0000 mg | Freq: Three times a day (TID) | INTRAMUSCULAR | Status: DC | PRN

## 2024-02-24 MED ORDER — IBUPROFEN 200 MG PO TABS
600.0000 mg | ORAL_TABLET | Freq: Three times a day (TID) | ORAL | Status: DC | PRN
  Administered 2024-02-27 – 2024-03-01 (×3): 600 mg via ORAL
  Filled 2024-02-24 (×3): qty 3

## 2024-02-24 MED ORDER — BUTALBITAL-APAP-CAFFEINE 50-325-40 MG PO TABS
1.0000 | ORAL_TABLET | Freq: Four times a day (QID) | ORAL | Status: DC | PRN
  Administered 2024-02-26 – 2024-03-01 (×8): 1 via ORAL
  Filled 2024-02-24 (×8): qty 1

## 2024-02-24 MED ORDER — FUROSEMIDE 20 MG PO TABS
20.0000 mg | ORAL_TABLET | Freq: Every day | ORAL | Status: DC
  Administered 2024-02-25 – 2024-03-01 (×6): 20 mg via ORAL
  Filled 2024-02-24 (×6): qty 1

## 2024-02-24 MED ORDER — OLANZAPINE 5 MG PO TBDP: 5.0000 mg | ORAL_TABLET | Freq: Three times a day (TID) | ORAL | Status: DC | PRN

## 2024-02-24 MED ORDER — NICOTINE 14 MG/24HR TD PT24
14.0000 mg | MEDICATED_PATCH | Freq: Every day | TRANSDERMAL | Status: DC
  Filled 2024-02-24 (×3): qty 1

## 2024-02-24 MED ORDER — CITALOPRAM HYDROBROMIDE 20 MG PO TABS
40.0000 mg | ORAL_TABLET | Freq: Every day | ORAL | Status: DC
  Administered 2024-02-25 – 2024-03-01 (×6): 40 mg via ORAL
  Filled 2024-02-24 (×6): qty 2

## 2024-02-24 MED ORDER — CLONIDINE HCL 0.1 MG PO TABS
0.2000 mg | ORAL_TABLET | ORAL | Status: DC
  Administered 2024-02-25: 0.2 mg via ORAL
  Filled 2024-02-24: qty 2

## 2024-02-24 MED ORDER — ONDANSETRON HCL 4 MG PO TABS: 4.0000 mg | ORAL_TABLET | Freq: Three times a day (TID) | ORAL | Status: DC | PRN

## 2024-02-24 MED ORDER — ALPRAZOLAM 0.5 MG PO TABS
1.0000 mg | ORAL_TABLET | Freq: Three times a day (TID) | ORAL | Status: DC | PRN
  Administered 2024-02-25 (×2): 1 mg via ORAL
  Filled 2024-02-24 (×2): qty 2

## 2024-02-24 MED ORDER — TRAZODONE HCL 50 MG PO TABS
50.0000 mg | ORAL_TABLET | Freq: Every evening | ORAL | Status: DC | PRN
  Administered 2024-02-24 – 2024-02-29 (×6): 50 mg via ORAL
  Filled 2024-02-24 (×6): qty 1

## 2024-02-24 MED ORDER — VITAMIN D 25 MCG (1000 UNIT) PO TABS
2000.0000 [IU] | ORAL_TABLET | Freq: Every day | ORAL | Status: DC
  Administered 2024-02-25 – 2024-03-01 (×6): 2000 [IU] via ORAL
  Filled 2024-02-24 (×6): qty 2

## 2024-02-24 MED ORDER — OLANZAPINE 5 MG PO TABS
10.0000 mg | ORAL_TABLET | Freq: Every day | ORAL | Status: DC
  Administered 2024-02-24 – 2024-02-25 (×2): 10 mg via ORAL
  Filled 2024-02-24 (×2): qty 2

## 2024-02-24 MED ORDER — MAGNESIUM HYDROXIDE 400 MG/5ML PO SUSP
30.0000 mL | Freq: Every day | ORAL | Status: DC | PRN
Start: 2024-02-24 — End: 2024-03-01

## 2024-02-24 MED ORDER — BUPRENORPHINE HCL-NALOXONE HCL 8-2 MG SL SUBL
1.0000 | SUBLINGUAL_TABLET | Freq: Two times a day (BID) | SUBLINGUAL | Status: DC
  Administered 2024-02-24 – 2024-03-01 (×7): 1 via SUBLINGUAL
  Filled 2024-02-24 (×11): qty 1

## 2024-02-24 MED ORDER — ALUM & MAG HYDROXIDE-SIMETH 200-200-20 MG/5ML PO SUSP
30.0000 mL | ORAL | Status: DC | PRN
  Administered 2024-02-24 – 2024-02-27 (×7): 30 mL via ORAL
  Filled 2024-02-24 (×7): qty 30

## 2024-02-24 NOTE — Plan of Care (Signed)
 New admit. No time to progress.    Problem: Education: Goal: Utilization of techniques to improve thought processes will improve Outcome: Not Progressing Goal: Knowledge of the prescribed therapeutic regimen will improve Outcome: Not Progressing   Problem: Activity: Goal: Interest or engagement in leisure activities will improve Outcome: Not Progressing Goal: Imbalance in normal sleep/wake cycle will improve Outcome: Not Progressing   Problem: Coping: Goal: Coping ability will improve Outcome: Not Progressing Goal: Will verbalize feelings Outcome: Not Progressing   Problem: Health Behavior/Discharge Planning: Goal: Ability to make decisions will improve Outcome: Not Progressing Goal: Compliance with therapeutic regimen will improve Outcome: Not Progressing   Problem: Role Relationship: Goal: Will demonstrate positive changes in social behaviors and relationships Outcome: Not Progressing   Problem: Safety: Goal: Ability to disclose and discuss suicidal ideas will improve Outcome: Not Progressing Goal: Ability to identify and utilize support systems that promote safety will improve Outcome: Not Progressing   Problem: Self-Concept: Goal: Will verbalize positive feelings about self Outcome: Not Progressing Goal: Level of anxiety will decrease Outcome: Not Progressing

## 2024-02-24 NOTE — ED Notes (Signed)
 Dinner and beverage provided in pt room.

## 2024-02-24 NOTE — Progress Notes (Signed)
 Patient is a voluntary admission to [facility] for [facility] with the loss of her husband in November.  Patient is tearful, states her support is her friend Randine as her daughter lives out of states. Is having difficulty with finances and has car trouble that causes her to have trouble getting to appointments and picking up medications.  Needs a new psychiatrist as well.  Denies SI, HI, AVH, and endorses anxiety 7/10 and depression 9/10.  Has been admitted in the past for suicide attempt while her husband was dying. States she is having trouble coping with the death of her husband.  Will continue to monitor.

## 2024-02-24 NOTE — ED Notes (Signed)
 Vol toc placement

## 2024-02-24 NOTE — Group Note (Signed)
 Date:  02/24/2024 Time:  11:01 PM  Group Topic/Focus:  Wrap-Up Group:   The focus of this group is to help patients review their daily goal of treatment and discuss progress on daily workbooks.    Participation Level:  Active  Participation Quality:  Appropriate  Affect:  Appropriate  Cognitive:  Appropriate  Insight: Appropriate  Engagement in Group:  Engaged  Modes of Intervention:  Discussion  Additional Comments:    Stephanie Skinner 02/24/2024, 11:01 PM

## 2024-02-24 NOTE — ED Provider Notes (Signed)
 Emergency Medicine Observation Re-evaluation Note  Stephanie Skinner is a 68 y.o. female, awaiting psychiatric disposition  Physical Exam  BP (!) 158/92 (BP Location: Left Arm)   Pulse 91   Temp 97.8 F (36.6 C)   Resp 19   Ht 5' 5 (1.651 m)   Wt 59 kg   SpO2 95%   BMI 21.63 kg/m  Physical Exam General: resting calmly.  ED Course / MDM  Covid negative  Plan  Current plan is for psychiatric placement.    Floy Roberts, MD 02/24/24 [phone]

## 2024-02-24 NOTE — ED Notes (Signed)
 vol/pending placement.Marland Kitchen

## 2024-02-24 NOTE — Tx Team (Signed)
 Initial Treatment Plan 02/24/2024 6:01 PM [name] FMW:[id number]    PATIENT STRESSORS: Loss of husband November 2024     PATIENT STRENGTHS: Communication skills    PATIENT IDENTIFIED PROBLEMS: Loss of husband in Nov 2024                     DISCHARGE CRITERIA:  Ability to meet basic life and health needs Improved stabilization in mood, thinking, and/or behavior  PRELIMINARY DISCHARGE PLAN: Return to previous living arrangement  PATIENT/FAMILY INVOLVEMENT: This treatment plan has been presented to and reviewed with the patient, Stephanie Skinner. The patient has been given the opportunity to ask questions and make suggestions.  Benton littie Gains, RN 02/24/2024, 6:01 PM

## 2024-02-24 NOTE — Progress Notes (Signed)
   02/24/24 2200  Psych Admission Type (Psych Patients Only)  Admission Status Voluntary  Psychosocial Assessment  Patient Complaints Depression  Eye Contact Brief  Facial Expression Sad  Affect Flat  Speech Slow;Soft  Interaction Assertive  Motor Activity Slow  Appearance/Hygiene In scrubs  Behavior Characteristics Cooperative  Mood Depressed  Thought Process  Coherency WDL  Content WDL  Delusions None reported or observed  Perception WDL  Hallucination None reported or observed  Judgment Impaired  Confusion WDL  Danger to Self  Current suicidal ideation? Denies  Danger to Others  Danger to Others None reported or observed

## 2024-02-25 DIAGNOSIS — F332 Major depressive disorder, recurrent severe without psychotic features: Secondary | ICD-10-CM | POA: Diagnosis not present

## 2024-02-25 MED ORDER — CLONIDINE HCL 0.1 MG PO TABS
0.1000 mg | ORAL_TABLET | ORAL | Status: DC
  Administered 2024-02-26 – 2024-02-27 (×4): 0.1 mg via ORAL
  Filled 2024-02-25 (×4): qty 1

## 2024-02-25 MED ORDER — OLANZAPINE 5 MG PO TABS
5.0000 mg | ORAL_TABLET | Freq: Every day | ORAL | Status: DC
  Administered 2024-02-26: 5 mg via ORAL
  Filled 2024-02-25: qty 1

## 2024-02-25 MED ORDER — ALPRAZOLAM 0.5 MG PO TABS
0.2500 mg | ORAL_TABLET | Freq: Three times a day (TID) | ORAL | Status: DC | PRN
  Administered 2024-02-25 – 2024-02-27 (×6): 0.25 mg via ORAL
  Filled 2024-02-25 (×6): qty 1

## 2024-02-25 NOTE — Group Note (Signed)
 [facility] LCSW Group Therapy Note   Group Date: 02/24/2024 Start Time: 1300 End Time: 1400   Type of Therapy/Topic:  Group Therapy:  Emotion Regulation  Participation Level:  Did Not Attend   Mood:  Description of Group:    The purpose of this group is to assist patients in learning to regulate negative emotions and experience positive emotions. Patients will be guided to discuss ways in which they have been vulnerable to their negative emotions. These vulnerabilities will be juxtaposed with experiences of positive emotions or situations, and patients challenged to use positive emotions to combat negative ones. Special emphasis will be placed on coping with negative emotions in conflict situations, and patients will process healthy conflict resolution skills.  Therapeutic Goals: Patient will identify two positive emotions or experiences to reflect on in order to balance out negative emotions:  Patient will label two or more emotions that they find the most difficult to experience:  Patient will be able to demonstrate positive conflict resolution skills through discussion or role plays:   Summary of Patient Progress:   X    Therapeutic Modalities:   Cognitive Behavioral Therapy Feelings Identification Dialectical Behavioral Therapy   Lum JONETTA Croft, LCSWA

## 2024-02-25 NOTE — BHH Suicide Risk Assessment (Signed)
 [facility] Admission Suicide Risk Assessment   Nursing information obtained from:    Demographic factors:  Age 68 or older, Caucasian, Low socioeconomic status Current Mental Status:  NA Loss Factors:  Loss of significant relationship Historical Factors:  Prior suicide attempts Risk Reduction Factors:  Positive social support  Total Time spent with patient: 30 minutes Principal Problem: MDD (major depressive disorder), recurrent severe, without psychosis (HCC) Diagnosis:  Principal Problem:   MDD (major depressive disorder), recurrent severe, without psychosis (HCC)  Subjective Data: Patient Is a 68 year old female, with depression and anxiety, who has been struggling significantly since the death of her husband in Jul 14, 2023. At that time, he passed away in hospice and she had a suicide attempt the following day by overdosing on her medications. Patient is admitted to [facility] unit with Q15 min safety monitoring. Multidisciplinary team approach is offered. Medication management; group/milieu therapy is offered.   Continued Clinical Symptoms:  Alcohol Use Disorder Identification Test Final Score (AUDIT): 4 The Alcohol Use Disorders Identification Test, Guidelines for Use in Primary Care, Second Edition.  [employer] [facility]). Score between 0-7:  no or low risk or alcohol related problems. Score between 8-15:  moderate risk of alcohol related problems. Score between 16-19:  high risk of alcohol related problems. Score 20 or above:  warrants further diagnostic evaluation for alcohol dependence and treatment.   CLINICAL FACTORS:   Severe Anxiety and/or Agitation   Musculoskeletal: Strength & Muscle Tone: decreased Gait & Station: unsteady Patient leans: N/A  Psychiatric Specialty Exam:  Presentation  General Appearance:  Casual  Eye Contact: Minimal  Speech: Slow; Slurred  Speech Volume: Decreased  Handedness: Right   Mood and Affect   Mood: Anxious  Affect: Constricted   Thought Process  Thought Processes: Irrevelant  Descriptions of Associations:No data recorded Orientation:Partial  Thought Content:-- (unable to assess)  History of Schizophrenia/Schizoaffective disorder:No data recorded Duration of Psychotic Symptoms:No data recorded Hallucinations:Hallucinations: -- (unable to assess)  Ideas of Reference:No data recorded Suicidal Thoughts:Suicidal Thoughts: -- (unable to assess)  Homicidal Thoughts:Homicidal Thoughts: -- (unable to assess)   Sensorium  Memory: -- (unable to assess)  Judgment: Impaired  Insight: Shallow   Executive Functions  Concentration: Poor  Attention Span: Poor  Recall:No data recorded Fund of Knowledge:No data recorded Language:No data recorded  Psychomotor Activity  Psychomotor Activity:No data recorded  Assets  Assets:No data recorded  Sleep  Sleep: Sleep: Fair    Physical Exam: Physical Exam ROS Blood pressure (!) 132/58, pulse 86, temperature (!) 97.4 F (36.3 C), resp. rate 16, height 5' 5 (1.651 m), weight 57.8 kg, SpO2 98%. Body mass index is 21.22 kg/m.   COGNITIVE FEATURES THAT CONTRIBUTE TO RISK:  None    SUICIDE RISK:   Moderate:  Frequent suicidal ideation with limited intensity, and duration, some specificity in terms of plans, no associated intent, good self-control, limited dysphoria/symptomatology, some risk factors present, and identifiable protective factors, including available and accessible social support.  PLAN OF CARE: Patient is admitted to [facility] psych unit with Q15 min safety monitoring. Multidisciplinary team approach is offered. Medication management; group/milieu therapy is offered.   I certify that inpatient services furnished can reasonably be expected to improve the patient's condition.   Rafiel Mecca, MD 02/25/2024, 10:00 PM

## 2024-02-25 NOTE — Plan of Care (Signed)

## 2024-02-25 NOTE — Group Note (Deleted)
 Physical/Occupational Therapy Group Note  Group Topic: Functional, Dynamic Balance   Group Date: 02/25/2024 Start Time: 1302 End Time: 1337 Facilitators: Sky Borboa, Alm Hamilton, PT   Group Description: Group discussed impact of balance on safety and independence with functional tasks.  Identified and discussed any self-perceived balance deficits to personalize information.  Discussed and reviewed strategies to address/improve balance deficits: use of assist devices, activity pacing/energy conservation, environment/home safety modifications, focusing attention/minimizing distraction.  Reviewed and participated with standing LE therex designed to target dynamic balance reactions and LE strength/stability; provided handouts with HEP to be utilized outside of group time as appropriate.  Allowed time for questions and further discussion on any balance or mobility concerns/needs.  Therapeutic Goal(s):  Identify and discuss any individual balance deficits and functional implications. Identify and discuss any environmental/home safety modifications that can optimize balance and safety for mobility within the home. Demonstrate understanding and performance of standing therex designed to target dynamic balance deficits.  Individual Participation:  Participation Level: {OT BHH Participation Ozczo:73732}   Participation Quality: {OT BHH Participation Quality:26268}   Behavior: {BHH OT Group Behavior:26269}   Speech/Thought Process: {BHH OT Speech/Thought Process:26270}   Affect/Mood: {OT BHH Affect/Mood:26271}   Insight: {OT BHH Insight:26272}   Judgement: {OT BHH Judgement:26272}   Modes of Intervention: {BHH MODES OF INTERVENTION:26273}  Patient Response to Interventions:  {BHH OT Patient Response to Interventions:26274}   Plan: Continue to engage patient in PT/OT groups 1 - 2x/week.  02/25/2024  Alm Hamilton Bertin, PT

## 2024-02-25 NOTE — H&P (Signed)
 Psychiatric Admission Assessment Adult  Patient Identification: Stephanie Skinner MRN:  [id number] Date of Evaluation:  02/25/2024 Chief Complaint:  MDD (major depressive disorder), recurrent severe, without psychosis (HCC) [F33.2]  Per Ed Note:  Stephanie Skinner is a 68 y.o. female with a history of hypertension, alcohol abuse who comes to the ED due to passive suicidal ideation.  Reports that since her husband died in 11/13/2024she has been alone, unable to come to terms with the loss.  She is having difficulty with sleeping, poor appetite, feels very depressed.  Tearful on encounter today.   Psychiatric Assessment  She carries the psychiatric diagnoses of GAD, Opiate Abuse, Alcohol Abuse, Benzodiazepine withdrawal with delirium,MDD, Overdose and has a past medical history of  Acute respiratory failure,Frequent Falls, Anemia,  Malnutrition, GI hemorrhage.    Her current presentation of depressed mood, disrupted sleep, tearful and labile mood, following the death of her spouse in 11-13-2024is most consistent with grief reaction and recurrent MDD. She meets criteria for overnight observation and AM psychiatric reassessment d/t her rambling and incoherent speech, and inability to provide chronological historical information.  Patient has not slept more than 4 hours within the past 3 days which I suspect contributes to her presentation today.  Additionally, she has a complex hx for alcohol, opioid and benzodiazepine abuse, with recently benzo overdose 7 months ago where she was hospitalized at [facility] geri psych unit.  From a medical standing, her sodium is low at 131 and her WBC are slightly elevated at 11.5.  She would benefit from U/A to evaluate for UTI which could contribute to confusion and mental decline.  Her current outpatient psychotropic medications include trazodone , olanzapine , dextroamphetamine , duloxetine and historically she has had a good response to these medications. As she presents  mentally decompensated, it's difficult to determine if she was medication compliant prior to admission. History of Present Illness: Patient Is a 68 year old female, with depression and anxiety, who has been struggling significantly since the death of her husband in 07/09/2023. At that time, he passed away in hospice and she had a suicide attempt the following day by overdosing on her medications. Patient is admitted to [facility] unit with Q15 min safety monitoring. Multidisciplinary team approach is offered. Medication management; group/milieu therapy is offered.   Patient initially met with the treatment team and reports that she is frustrated and is unable to tolerate being alone.  She talks about losing her partner of many years and states that she is not used to be staying alone.  Patient is noted to be very drowsy and falling asleep.  She is unable to engage in any meaningful interview.  Patient was accompanied back to her room by the nurse.  When provider went later on to check on the patient she was snoring off and in deep sleep.  Provider called both numbers in the chart and left a voicemail to her daughter with no reply or response given after 2 attempts.  Another number-unable to leave any voicemail.  Total Time spent with patient: 45 minutes Sleep  Sleep:Sleep: Fair  Past Psychiatric History:  Psychiatric History:  Information collected from chart  Prev Dx/Sx: Depression, anxiety Current Psych Provider: none as her psychiatrist retired Copywriter, advertising (current): Cymbalta, xanax  Previous Med Trials: celexa  Therapy: denies Prev Dx/Sx: MDD, GAD, Benzodiazepine abuse and overdose, opiate abuse, alcohol abuse,  Current Psych Provider: Dr. Laurier as [facility]-retired    Prior Psych Hospitalization: yes  Prior Self Harm:  yes Prior Violence: denies   Family Psych History: unknown Family Hx suicide: unknown   Social History: deferred d/t patient unable to continue  participation in assessment   Access to weapons/lethal means: she denies  I just want to stop the hurting that's inside of me.  She denies prior suicie  Substance History Alcohol: yes, but pt offers incongruent information  Tobacco: she denies Illicit drugs: she denies Is the patient at risk to self? Yes.    Has the patient been a risk to self in the past 6 months? No.  Has the patient been a risk to self within the distant past? No.  Is the patient a risk to others? No.  Has the patient been a risk to others in the past 6 months? No.  Has the patient been a risk to others within the distant past? No.   [place] Scale:  Flowsheet Row Admission (Current) from 02/24/2024 in [facility] BE[facility]AL [facility] ED from 02/22/2024 in [facility] Emergency Department at [facility] ED from 09/22/2023 in [facility] Emergency Department at [facility]  C-SSRS RISK CATEGORY Low Risk High Risk No Risk     Past Medical History:  Past Medical History:  Diagnosis Date   Anemia 05/24/2022   Arthritis    COVID-19    ETOH abuse    Hepatitis C    pt states she has been cured   Hypertension    Seizures (HCC)    not on meds    Past Surgical History:  Procedure Laterality Date   ABDOMINAL HYSTERECTOMY     partial hysterectomy   ELBOW SURGERY     ESOPHAGOGASTRODUODENOSCOPY (EGD) WITH PROPOFOL  N/A 05/24/2022   Procedure: ESOPHAGOGASTRODUODENOSCOPY (EGD) WITH PROPOFOL ;  Surgeon: Unk Corinn Skiff, MD;  Location: [facility] ENDOSCOPY;  Service: Gastroenterology;  Laterality: N/A;   Family History:  Family History  Problem Relation Age of Onset   Diabetes Sister    Colon cancer Sister     Social History:  Social History   Substance and Sexual Activity  Alcohol Use Yes   Alcohol/week: 6.0 standard drinks of alcohol   Types: 6 Cans of beer per week   Comment: occ     Social History   Substance and Sexual Activity  Drug Use No      Allergies:   Allergies  Allergen  Reactions   Codeine Nausea And Vomiting   Morphine And Codeine Other (See Comments)    Seizure    Sulfa Antibiotics Nausea And Vomiting   Lab Results:  Results for orders placed or performed during the hospital encounter of 02/22/24 (from the past 48 hours)  SARS Coronavirus 2 by RT PCR (hospital order, performed in [facility] hospital lab) *cepheid single result test* Anterior Nasal Swab     Status: None   Collection Time: 02/24/24 12:55 AM   Specimen: Anterior Nasal Swab  Result Value Ref Range   SARS Coronavirus 2 by RT PCR NEGATIVE NEGATIVE    Comment: (NOTE) SARS-CoV-2 target nucleic acids are NOT DETECTED.  The SARS-CoV-2 RNA is generally detectable in upper and lower respiratory specimens during the acute phase of infection. The lowest concentration of SARS-CoV-2 viral copies this assay can detect is 250 copies / mL. A negative result does not preclude SARS-CoV-2 infection and should not be used as the sole basis for treatment or other patient management decisions.  A negative result may occur with improper specimen collection / handling, submission of specimen other than nasopharyngeal swab, presence of viral  mutation(s) within the areas targeted by this assay, and inadequate number of viral copies (<250 copies / mL). A negative result must be combined with clinical observations, patient history, and epidemiological information.  Fact Sheet for Patients:   RoadLapTop.co.za  Fact Sheet for Healthcare Providers: http://kim-miller.com/  This test is not yet approved or  cleared by the [place]  FDA and has been authorized for detection and/or diagnosis of SARS-CoV-2 by FDA under an Emergency Use Authorization (EUA).  This EUA will remain in effect (meaning this test can be used) for the duration of the COVID-19 declaration under Section 564(b)(1) of the Act, 21 U.S.C. section 360bbb-3(b)(1), unless the authorization is  terminated or revoked sooner.  Performed at [facility], [address] [place], [place] [address]     Blood Alcohol level:  Lab Results  Component Value Date   [facility] <15 02/22/2024   ETH <10 09/22/2023    Metabolic Disorder Labs:  Lab Results  Component Value Date   HGBA1C 5.7 (H) 07/18/2023   MPG 116.89 07/18/2023   No results found for: PROLACTIN Lab Results  Component Value Date   CHOL 180 07/18/2023   TRIG 89 07/18/2023   HDL 60 07/18/2023   CHOLHDL 3.0 07/18/2023   VLDL 18 07/18/2023   LDLCALC 102 (H) 07/18/2023    Current Medications: Current Facility-Administered Medications  Medication Dose Route Frequency Provider Last Rate Last Admin   acetaminophen  (TYLENOL ) tablet 650 mg  650 mg Oral Q6H PRN Zakya Halabi, MD   650 mg at 02/25/24 1736   ALPRAZolam  (XANAX ) tablet 0.25 mg  0.25 mg Oral TID PRN Margarett Viti, MD   0.25 mg at 02/25/24 2125   alum & mag hydroxide-simeth (MAALOX/MYLANTA) 200-200-20 MG/5ML suspension 30 mL  30 mL Oral Q4H PRN Donnelly Mellow, MD   30 mL at 02/25/24 2040   buprenorphine -naloxone  (SUBOXONE ) 8-2 mg per SL tablet 1 tablet  1 tablet Sublingual BID Doctor Sheahan, MD   1 tablet at 02/25/24 2125   butalbital -acetaminophen -caffeine  (FIORICET ) 50-325-40 MG per tablet 1 tablet  1 tablet Oral Q6H PRN Ollie Delano, MD       cholecalciferol  (VITAMIN D3) 25 MCG (1000 UNIT) tablet 2,000 Units  2,000 Units Oral Daily Kaleena Corrow, MD   2,000 Units at 02/25/24 9056   citalopram  (CELEXA ) tablet 40 mg  40 mg Oral Daily Mansoor Hillyard, MD   40 mg at 02/25/24 0944   [START ON 02/26/2024] cloNIDine  (CATAPRES ) tablet 0.1 mg  0.1 mg Oral [id number] Marzetta Lanza, MD       furosemide  (LASIX ) tablet 20 mg  20 mg Oral QPC breakfast Donnelly Mellow, MD   20 mg at 02/25/24 9056   ibuprofen  (ADVIL ) tablet 600 mg  600 mg Oral Q8H PRN Shevon Sian, MD       magnesium  hydroxide (MILK OF MAGNESIA) suspension 30 mL  30 mL Oral Daily PRN  Donnelly Mellow, MD       nicotine  (NICODERM CQ  - dosed in mg/24 hours) patch 14 mg  14 mg Transdermal Daily Delayne Sanzo, MD       OLANZapine  (ZYPREXA ) injection 5 mg  5 mg Intramuscular TID PRN Roselynne Lortz, MD       OLANZapine  (ZYPREXA ) tablet 10 mg  10 mg Oral QHS Lennon Richins, MD   10 mg at 02/25/24 2125   OLANZapine  zydis (ZYPREXA ) disintegrating tablet 5 mg  5 mg Oral TID PRN Zanya Lindo, MD       ondansetron  (ZOFRAN ) tablet 4 mg  4 mg Oral Q8H PRN Maurilio Puryear, MD       traZODone  (DESYREL ) tablet 50 mg  50 mg Oral QHS PRN Ettamae Barkett, MD   50 mg at 02/24/24 2149   PTA Medications: Medications Prior to Admission  Medication Sig Dispense Refill Last Dose/Taking   ALPRAZolam  (XANAX ) 1 MG tablet Take 1 tablet (1 mg total) by mouth 3 (three) times daily as needed for anxiety. 90 tablet 0    buprenorphine -naloxone  (SUBOXONE ) 8-2 mg SUBL SL tablet Place 1 tablet under the tongue 2 (two) times daily. 60 tablet 0    butalbital -acetaminophen -caffeine  (FIORICET ) 50-325-40 MG tablet Take 1 tablet by mouth every 6 (six) hours as needed for headache. 14 tablet 0    chlordiazePOXIDE  (LIBRIUM ) 25 MG capsule Take by mouth. (Patient not taking: Reported on 02/23/2024)      Cholecalciferol  50 MCG (2000 UT) CAPS Take 1 tablet by mouth daily.      citalopram  (CELEXA ) 40 MG tablet Take 1 tablet (40 mg total) by mouth daily. 30 tablet 3    cloNIDine  (CATAPRES ) 0.2 MG tablet Take 1 tablet (0.2 mg total) by mouth 2 (two) times daily at 8 am and 4 pm. 60 tablet 3    DULoxetine (CYMBALTA) 30 MG capsule Take 30 mg by mouth 2 (two) times daily.      furosemide  (LASIX ) 20 MG tablet Take 1 tablet (20 mg total) by mouth daily after breakfast. 30 tablet 3    Multiple Vitamin (MULTIVITAMIN WITH MINERALS) TABS tablet Take 1 tablet by mouth daily. (Patient not taking: Reported on 07/13/2023) 30 tablet 0    OLANZapine  (ZYPREXA ) 10 MG tablet Take 1 tablet (10 mg total) by mouth at bedtime. 30 tablet 3     pantoprazole  (PROTONIX ) 40 MG tablet Take 1 tablet (40 mg total) by mouth daily. (Patient not taking: Reported on 02/23/2024) 30 tablet 3    traZODone  (DESYREL ) 50 MG tablet Take 1 tablet (50 mg total) by mouth at bedtime as needed for sleep. 30 tablet 3     Psychiatric Specialty Exam:  Presentation  General Appearance:  Casual  Eye Contact: Minimal  Speech: Slow; Slurred  Speech Volume: Decreased    Mood and Affect  Mood: Anxious  Affect: Constricted   Thought Process  Thought Processes: Irrevelant  Descriptions of Associations:No data recorded Orientation:Partial  Thought Content:-- (unable to assess)  Hallucinations:Hallucinations: -- (unable to assess)  Ideas of Reference:No data recorded Suicidal Thoughts:Suicidal Thoughts: -- (unable to assess)  Homicidal Thoughts:Homicidal Thoughts: -- (unable to assess)   Sensorium  Memory: -- (unable to assess)  Judgment: Impaired  Insight: Shallow   Executive Functions  Concentration: Poor  Attention Span: Poor  Recall:No data recorded Fund of Knowledge:No data recorded Language:No data recorded  Psychomotor Activity  Psychomotor Activity:No data recorded  Assets  Assets:No data recorded   Musculoskeletal: Strength & Muscle Tone: decreased Gait & Station: unsteady  Physical Exam: Physical Exam Vitals and nursing note reviewed.  HENT:     Head: Normocephalic.     Nose: Nose normal.  Pulmonary:     Breath sounds: Normal breath sounds.  Abdominal:     Palpations: Abdomen is soft.    Review of Systems  Constitutional: Negative.   HENT: Negative.    Skin: Negative.    Blood pressure (!) 132/58, pulse 86, temperature (!) 97.4 F (36.3 C), resp. rate 16, height 5' 5 (1.651 m), weight 57.8 kg, SpO2 98%. Body mass index is 21.22 kg/m.  Principal Diagnosis: MDD (major  depressive disorder), recurrent severe, without psychosis (HCC) Diagnosis:  Principal Problem:   MDD (major  depressive disorder), recurrent severe, without psychosis (HCC)   Clinical Decision Making: Patient with history of depression and anxiety admitted for worsening suicidal ideation and worsening depression with feeling hopelessness in the context of losing her partner of many years.  Patient was recently hospitalized in June and she reappeared to the ED with worsening symptoms.  Patient is noted to be on polypharmacy and heavily sedated.  Patient needs ongoing hospitalization for medication management.  Treatment Plan Summary:  Safety and Monitoring:             -- Voluntary admission to inpatient psychiatric unit for safety, stabilization and treatment             -- Daily contact with patient to assess and evaluate symptoms and progress in treatment             -- Patient's case to be discussed in multi-disciplinary team meeting             -- Observation Level: q15 minute checks             -- Vital signs:  q12 hours             -- Precautions: suicide, elopement, and assault   2. Psychiatric Diagnoses and Treatment:              Reduced xanax  dose to 0.25 TID prn as patient is heavily sedated Reduced Zyprexa  dose to 5mg  at bedtime Celexa  40 mg for depression   -- The risks/benefits/side-effects/alternatives to this medication were discussed in detail with the patient and time was given for questions. The patient consents to medication trial.                -- Metabolic profile and EKG monitoring obtained while on an atypical antipsychotic (BMI: Lipid Panel: HbgA1c: QTc:)              -- Encouraged patient to participate in unit milieu and in scheduled group therapies                            3. Medical Issues Being Addressed:      4. Discharge Planning:              -- Social work and case management to assist with discharge planning and identification of hospital follow-up needs prior to discharge             -- Estimated LOS: 5-7 days             -- Discharge Concerns: Need to  establish a safety plan; Medication compliance and effectiveness             -- Discharge Goals: Return home with outpatient referrals follow ups  Physician Treatment Plan for Primary Diagnosis: MDD (major depressive disorder), recurrent severe, without psychosis (HCC) Long Term Goal(s): Improvement in symptoms so as ready for discharge  Short Term Goals: Ability to identify changes in lifestyle to reduce recurrence of condition will improve, Ability to verbalize feelings will improve, Ability to disclose and discuss suicidal ideas, Ability to demonstrate self-control will improve, and Ability to identify and develop effective coping behaviors will improve  Physician Treatment Plan for Secondary Diagnosis: Principal Problem:   MDD (major depressive disorder), recurrent severe, without psychosis (HCC)  Long Term Goal(s): Improvement in symptoms so as ready for discharge  Short  Term Goals: Ability to identify changes in lifestyle to reduce recurrence of condition will improve, Ability to verbalize feelings will improve, Ability to disclose and discuss suicidal ideas, Ability to demonstrate self-control will improve, and Ability to identify and develop effective coping behaviors will improve  I certify that inpatient services furnished can reasonably be expected to improve the patient's condition.    Hanad Leino, MD 7/2/202510:03 PM

## 2024-02-25 NOTE — Progress Notes (Signed)
   02/25/24 1600  Psych Admission Type (Psych Patients Only)  Admission Status Voluntary  Psychosocial Assessment  Patient Complaints Anxiety;Depression  Eye Contact Fair  Facial Expression Blank  Affect Flat  Speech Soft  Interaction Assertive  Motor Activity Slow  Appearance/Hygiene In scrubs  Behavior Characteristics Cooperative  Mood Depressed  Thought Process  Coherency WDL  Content WDL  Delusions None reported or observed  Perception WDL  Hallucination None reported or observed  Judgment Impaired  Confusion None  Danger to Self  Current suicidal ideation? Denies

## 2024-02-25 NOTE — Group Note (Signed)
 Date:  02/27/2024 Time:  2:08 PM  Group Topic/Focus:  Movement therapy    Participation Level:  Active  Participation Quality:  Appropriate  Affect:  Appropriate  Cognitive:  Appropriate  Insight: Appropriate  Engagement in Group:  Engaged  Modes of Intervention:  Activity  Additional Comments:  none  Norleen SHAUNNA Bias 02/27/2024, 2:08 PM

## 2024-02-25 NOTE — Group Note (Signed)
 Physical/Occupational Therapy Group Note  Group Topic: Functional, Dynamic Balance   Group Date: 02/25/2024 Start Time: 1302 End Time: 1337 Facilitators: Weltha Cathy, Alm Hamilton, PT   Group Description: Group discussed impact of balance on safety and independence with functional tasks.  Identified and discussed any self-perceived balance deficits to personalize information.  Discussed and reviewed strategies to address/improve balance deficits: use of assist devices, activity pacing/energy conservation, environment/home safety modifications, focusing attention/minimizing distraction.  Reviewed and participated with standing LE therex designed to target dynamic balance reactions and LE strength/stability; provided handouts with HEP to be utilized outside of group time as appropriate.  Allowed time for questions and further discussion on any balance or mobility concerns/needs.  Therapeutic Goal(s):  Identify and discuss any individual balance deficits and functional implications. Identify and discuss any environmental/home safety modifications that can optimize balance and safety for mobility within the home. Demonstrate understanding and performance of standing therex designed to target dynamic balance deficits.  Individual Participation: Did not attend  Participation Level:   Participation Quality:   Behavior:   Speech/Thought Process:   Affect/Mood:   Insight:   Judgement:   Modes of Intervention:   Patient Response to Interventions:    Plan: Continue to engage patient in PT/OT groups 1 - 2x/week.  CHARM Hamilton Bertin PT, DPT 02/25/24, 5:11 PM

## 2024-02-25 NOTE — Group Note (Signed)
 Date:  02/25/2024 Time:  9:23 PM  Group Topic/Focus:  Wrap-Up Group:   The focus of this group is to help patients review their daily goal of treatment and discuss progress on daily workbooks.    Participation Level:  Active  Participation Quality:     Affect:     Cognitive:     Insight: None  Engagement in Group:     Modes of Intervention:     Additional Comments:    Stephanie Skinner 02/25/2024, 9:23 PM

## 2024-02-25 NOTE — BH IP Treatment Plan (Signed)
 Interdisciplinary Treatment and Diagnostic Plan Update  02/25/2024 Time of Session: 11:25 AM  JONELL BRUMBAUGH MRN: [id number]  Principal Diagnosis: MDD (major depressive disorder), recurrent severe, without psychosis (HCC)  Secondary Diagnoses: Principal Problem:   MDD (major depressive disorder), recurrent severe, without psychosis (HCC)   Current Medications:  Current Facility-Administered Medications  Medication Dose Route Frequency Provider Last Rate Last Admin   acetaminophen  (TYLENOL ) tablet 650 mg  650 mg Oral Q6H PRN Jadapalle, Sree, MD       ALPRAZolam  (XANAX ) tablet 1 mg  1 mg Oral TID PRN Jadapalle, Sree, MD   1 mg at 02/25/24 0943   alum & mag hydroxide-simeth (MAALOX/MYLANTA) 200-200-20 MG/5ML suspension 30 mL  30 mL Oral Q4H PRN Jadapalle, Sree, MD   30 mL at 02/24/24 2149   buprenorphine -naloxone  (SUBOXONE ) 8-2 mg per SL tablet 1 tablet  1 tablet Sublingual BID Jadapalle, Sree, MD   1 tablet at 02/25/24 9056   butalbital -acetaminophen -caffeine  (FIORICET ) 50-325-40 MG per tablet 1 tablet  1 tablet Oral Q6H PRN Jadapalle, Sree, MD       cholecalciferol  (VITAMIN D3) 25 MCG (1000 UNIT) tablet 2,000 Units  2,000 Units Oral Daily Jadapalle, Sree, MD   2,000 Units at 02/25/24 9056   citalopram  (CELEXA ) tablet 40 mg  40 mg Oral Daily Jadapalle, Sree, MD   40 mg at 02/25/24 0944   cloNIDine  (CATAPRES ) tablet 0.2 mg  0.2 mg Oral [id number] Jadapalle, Sree, MD   0.2 mg at 02/25/24 9055   furosemide  (LASIX ) tablet 20 mg  20 mg Oral QPC breakfast Donnelly Mellow, MD   20 mg at 02/25/24 9056   ibuprofen  (ADVIL ) tablet 600 mg  600 mg Oral Q8H PRN Jadapalle, Sree, MD       magnesium  hydroxide (MILK OF MAGNESIA) suspension 30 mL  30 mL Oral Daily PRN Donnelly Mellow, MD       nicotine  (NICODERM CQ  - dosed in mg/24 hours) patch 14 mg  14 mg Transdermal Daily Jadapalle, Sree, MD       OLANZapine  (ZYPREXA ) injection 5 mg  5 mg Intramuscular TID PRN Jadapalle, Sree, MD       OLANZapine  (ZYPREXA )  tablet 10 mg  10 mg Oral QHS Jadapalle, Sree, MD   10 mg at 02/24/24 2149   OLANZapine  zydis (ZYPREXA ) disintegrating tablet 5 mg  5 mg Oral TID PRN Jadapalle, Sree, MD       ondansetron  (ZOFRAN ) tablet 4 mg  4 mg Oral Q8H PRN Jadapalle, Sree, MD       traZODone  (DESYREL ) tablet 50 mg  50 mg Oral QHS PRN Jadapalle, Sree, MD   50 mg at 02/24/24 2149   PTA Medications: Medications Prior to Admission  Medication Sig Dispense Refill Last Dose/Taking   ALPRAZolam  (XANAX ) 1 MG tablet Take 1 tablet (1 mg total) by mouth 3 (three) times daily as needed for anxiety. 90 tablet 0    buprenorphine -naloxone  (SUBOXONE ) 8-2 mg SUBL SL tablet Place 1 tablet under the tongue 2 (two) times daily. 60 tablet 0    butalbital -acetaminophen -caffeine  (FIORICET ) 50-325-40 MG tablet Take 1 tablet by mouth every 6 (six) hours as needed for headache. 14 tablet 0    chlordiazePOXIDE  (LIBRIUM ) 25 MG capsule Take by mouth. (Patient not taking: Reported on 02/23/2024)      Cholecalciferol  50 MCG (2000 UT) CAPS Take 1 tablet by mouth daily.      citalopram  (CELEXA ) 40 MG tablet Take 1 tablet (40 mg total) by mouth daily. 30 tablet 3  cloNIDine  (CATAPRES ) 0.2 MG tablet Take 1 tablet (0.2 mg total) by mouth 2 (two) times daily at 8 am and 4 pm. 60 tablet 3    DULoxetine (CYMBALTA) 30 MG capsule Take 30 mg by mouth 2 (two) times daily.      furosemide  (LASIX ) 20 MG tablet Take 1 tablet (20 mg total) by mouth daily after breakfast. 30 tablet 3    Multiple Vitamin (MULTIVITAMIN WITH MINERALS) TABS tablet Take 1 tablet by mouth daily. (Patient not taking: Reported on 07/13/2023) 30 tablet 0    OLANZapine  (ZYPREXA ) 10 MG tablet Take 1 tablet (10 mg total) by mouth at bedtime. 30 tablet 3    pantoprazole  (PROTONIX ) 40 MG tablet Take 1 tablet (40 mg total) by mouth daily. (Patient not taking: Reported on 02/23/2024) 30 tablet 3    traZODone  (DESYREL ) 50 MG tablet Take 1 tablet (50 mg total) by mouth at bedtime as needed for sleep. 30  tablet 3     Patient Stressors: Loss of husband November 2024    Patient Strengths: Communication skills   Treatment Modalities: Medication Management, Group therapy, Case management,  1 to 1 session with clinician, Psychoeducation, Recreational therapy.   Physician Treatment Plan for Primary Diagnosis: MDD (major depressive disorder), recurrent severe, without psychosis (HCC) Long Term Goal(s):     Short Term Goals:    Medication Management: Evaluate patient's response, side effects, and tolerance of medication regimen.  Therapeutic Interventions: 1 to 1 sessions, Unit Group sessions and Medication administration.  Evaluation of Outcomes: Not Progressing  Physician Treatment Plan for Secondary Diagnosis: Principal Problem:   MDD (major depressive disorder), recurrent severe, without psychosis (HCC)  Long Term Goal(s):     Short Term Goals:       Medication Management: Evaluate patient's response, side effects, and tolerance of medication regimen.  Therapeutic Interventions: 1 to 1 sessions, Unit Group sessions and Medication administration.  Evaluation of Outcomes: Not Progressing   RN Treatment Plan for Primary Diagnosis: MDD (major depressive disorder), recurrent severe, without psychosis (HCC) Long Term Goal(s): Knowledge of disease and therapeutic regimen to maintain health will improve  Short Term Goals: Ability to remain free from injury will improve, Ability to verbalize frustration and anger appropriately will improve, Ability to demonstrate self-control, Ability to participate in decision making will improve, Ability to verbalize feelings will improve, Ability to disclose and discuss suicidal ideas, Ability to identify and develop effective coping behaviors will improve, and Compliance with prescribed medications will improve  Medication Management: RN will administer medications as ordered by provider, will assess and evaluate patient's response and provide  education to patient for prescribed medication. RN will report any adverse and/or side effects to prescribing provider.  Therapeutic Interventions: 1 on 1 counseling sessions, Psychoeducation, Medication administration, Evaluate responses to treatment, Monitor vital signs and CBGs as ordered, Perform/monitor CIWA, COWS, AIMS and Fall Risk screenings as ordered, Perform wound care treatments as ordered.  Evaluation of Outcomes: Not Progressing   LCSW Treatment Plan for Primary Diagnosis: MDD (major depressive disorder), recurrent severe, without psychosis (HCC) Long Term Goal(s): Safe transition to appropriate next level of care at discharge, Engage patient in therapeutic group addressing interpersonal concerns.  Short Term Goals: Engage patient in aftercare planning with referrals and resources, Increase social support, Increase ability to appropriately verbalize feelings, Increase emotional regulation, Facilitate acceptance of mental health diagnosis and concerns, Facilitate patient progression through stages of change regarding substance use diagnoses and concerns, Identify triggers associated with mental health/substance abuse issues, and  Increase skills for wellness and recovery  Therapeutic Interventions: Assess for all discharge needs, 1 to 1 time with Social worker, Explore available resources and support systems, Assess for adequacy in community support network, Educate family and significant other(s) on suicide prevention, Complete Psychosocial Assessment, Interpersonal group therapy.  Evaluation of Outcomes: Not Progressing   Progress in Treatment: Attending groups: No. Participating in groups: No. Taking medication as prescribed: Yes. Toleration medication: Yes. Family/Significant other contact made: No, will contact:  CSW will contact if given permission  Patient understands diagnosis: Yes. Discussing patient identified problems/goals with staff: Yes. Medical problems stabilized  or resolved: Yes. Denies suicidal/homicidal ideation: Yes. Issues/concerns per patient self-inventory: No. Other: None  New problem(s) identified: No, Describe:  None identified  New Short Term/Long Term Goal(s): elimination of symptoms of psychosis, medication management for mood stabilization; elimination of SI thoughts; development of comprehensive mental wellness plan.   Patient Goals:   I don't really know I just want to feel right again  Discharge Plan or Barriers: CSW will assist with appropriate discharge planning   Reason for Continuation of Hospitalization: Depression Medication stabilization  Estimated Length of Stay: 1 to 7 days  Last 3 [place] Suicide Severity Risk Score: Flowsheet Row Admission (Current) from 02/24/2024 in [facility] ED from 02/22/2024 in [facility] Emergency Department at [facility] ED from 09/22/2023 in [facility] Emergency Department at [facility]  C-SSRS RISK CATEGORY Low Risk High Risk No Risk    Last PHQ 2/9 Scores:     No data to display          Scribe for Treatment Team: Lum JONETTA Croft, [place]RA[place] 02/25/2024 1:07 PM

## 2024-02-25 NOTE — Group Note (Unsigned)
 Date:  02/25/2024 Time:  10:20 AM  Group Topic/Focus: Movement Therapy     Participation Level:  {BHH PARTICIPATION OZCZO:77735}  Participation Quality:  {BHH PARTICIPATION QUALITY:22265}  Affect:  {BHH AFFECT:22266}  Cognitive:  {BHH COGNITIVE:22267}  Insight: {BHH Insight2:20797}  Engagement in Group:  {BHH ENGAGEMENT IN HMNLE:77731}  Modes of Intervention:  {BHH MODES OF INTERVENTION:22269}  Additional Comments:  ***  Norleen SHAUNNA Bias 02/25/2024, 10:20 AM

## 2024-02-26 DIAGNOSIS — F332 Major depressive disorder, recurrent severe without psychotic features: Secondary | ICD-10-CM

## 2024-02-26 MED ORDER — AMLODIPINE BESYLATE 5 MG PO TABS
5.0000 mg | ORAL_TABLET | Freq: Every day | ORAL | Status: DC
  Administered 2024-02-26 – 2024-02-28 (×3): 5 mg via ORAL
  Filled 2024-02-26 (×3): qty 1

## 2024-02-26 NOTE — Group Note (Signed)
 [facility] LCSW Group Therapy Note    Group Date: 02/26/2024 Start Time: 1300 End Time: 1330  Type of Therapy and Topic:  Group Therapy:  Overcoming Obstacles  Participation Level:  BHH PARTICIPATION LEVEL: Minimal  Mood:  Description of Group:   In this group patients will be encouraged to explore what they see as obstacles to their own wellness and recovery. They will be guided to discuss their thoughts, feelings, and behaviors related to these obstacles. The group will process together ways to cope with barriers, with attention given to specific choices patients can make. Each patient will be challenged to identify changes they are motivated to make in order to overcome their obstacles. This group will be process-oriented, with patients participating in exploration of their own experiences as well as giving and receiving support and challenge from other group members.  Therapeutic Goals: 1. Patient will identify personal and current obstacles as they relate to admission. 2. Patient will identify barriers that currently interfere with their wellness or overcoming obstacles.  3. Patient will identify feelings, thought process and behaviors related to these barriers. 4. Patient will identify two changes they are willing to make to overcome these obstacles:    Summary of Patient Progress   Pt active and appropriate throughout group   Therapeutic Modalities:   Cognitive Behavioral Therapy Solution Focused Therapy Motivational Interviewing Relapse Prevention Therapy   Stephanie Skinner, LCSWA

## 2024-02-26 NOTE — Progress Notes (Signed)
   02/26/24 0740  Psych Admission Type (Psych Patients Only)  Admission Status Voluntary  Psychosocial Assessment  Patient Complaints Anxiety;Nervousness  Eye Contact Fair  Facial Expression Blank  Affect Flat  Speech Soft  Interaction Assertive  Motor Activity Slow  Appearance/Hygiene In scrubs  Behavior Characteristics Cooperative  Mood Pleasant  Thought Process  Coherency WDL  Content WDL  Delusions None reported or observed  Perception WDL  Hallucination None reported or observed  Judgment Impaired  Confusion None  Danger to Self  Current suicidal ideation? Denies

## 2024-02-26 NOTE — Group Note (Signed)
 Recreation Therapy Group Note   Group Topic:Emotion Expression  Group Date: 02/26/2024 Start Time: 1400 End Time: 1450 Facilitators: Celestia Jeoffrey BRAVO, LRT, CTRS Location: Dayroom  Group Description: Positive Affirmation Bingo. LRT and patients played multiple games of Bingo with music playing in the background. LRT and pts discussed what a positive affirmation is, the importance of speaking kindly to yourself, and the use of this as a coping skill.   Goal Area(s) Addressed: Patient will learn positive affirmations.  Patient will engage in recreation activity.  Patient will increase communication.    Affect/Mood: Appropriate   Participation Level: Active and Engaged   Participation Quality: Independent   Behavior: Appropriate, Calm, and Cooperative   Speech/Thought Process: Coherent   Insight: Good   Judgement: Good   Modes of Intervention: Competitive Play, Open Conversation, and Socialization   Patient Response to Interventions:  Attentive, Engaged, Interested , and Receptive   Education Outcome:  Acknowledges education   Clinical Observations/Individualized Feedback: Areebah was active in their participation of session activities and group discussion. Pt interacted well with LRT and peers duration of session.    Plan: Continue to engage patient in RT group sessions 2-3x/week.   Jeoffrey BRAVO Celestia, LRT, CTRS 02/26/2024 4:51 PM

## 2024-02-26 NOTE — Group Note (Signed)
 Date:  02/26/2024 Time:  9:17 PM  Group Topic/Focus:  Identifying Needs:   The focus of this group is to help patients identify their personal needs that have been historically problematic and identify healthy behaviors to address their needs.    Participation Level:  Active  Participation Quality:  Appropriate  Affect:  Appropriate  Cognitive:  Appropriate  Insight: Good  Engagement in Group:  Engaged  Modes of Intervention:    Additional Comments:    Stephanie Skinner 02/26/2024, 9:17 PM

## 2024-02-26 NOTE — Plan of Care (Signed)

## 2024-02-26 NOTE — Plan of Care (Signed)

## 2024-02-26 NOTE — Group Note (Unsigned)
 Date:  02/26/2024 Time:  2:17 PM  Group Topic/Focus:  Self Care:   The focus of this group is to help patients understand the importance of self-care in order to improve or restore emotional, physical, spiritual, interpersonal, and financial health.     Participation Level:  {BHH PARTICIPATION OZCZO:77735}  Participation Quality:  {BHH PARTICIPATION QUALITY:22265}  Affect:  {BHH AFFECT:22266}  Cognitive:  {BHH COGNITIVE:22267}  Insight: {BHH Insight2:20797}  Engagement in Group:  {BHH ENGAGEMENT IN HMNLE:77731}  Modes of Intervention:  {BHH MODES OF INTERVENTION:22269}  Additional Comments:  ***  Stephanie Skinner 02/26/2024, 2:17 PM

## 2024-02-26 NOTE — Progress Notes (Signed)
 [facility] MD Progress Note  02/26/2024 1:19 PM Stephanie Skinner  MRN:  [id number]  Patient Is a 68 year old female, with depression and anxiety, who has been struggling significantly since the death of her husband in 2023/07/04. At that time, he passed away in hospice and she had a suicide attempt the following day by overdosing on her medications. Patient is admitted to [facility] unit with Q15 min safety monitoring. Multidisciplinary team approach is offered. Medication management; group/milieu therapy is offered.  Subjective:  Chart reviewed, case discussed in multidisciplinary meeting, patient seen during rounds.  Patient is noted to be sitting in the day area.  She is awake, alert, oriented x 4.  She reports that she did not sleep for many days and thus why she was sleepy.  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  Patient contributes her heavy sedation yesterday for not able to sleep before but patient educated her that she is on polypharmacy including Xanax  1 mg 3 times daily, clonidine  0.2 mg twice daily, Suboxone  8 mg twice daily and other medications.  Patient reports that she has not been visiting a primary care for many years and reports that her psychiatrist was her PCP also and was giving her all her medications.  Provider educated about high blood pressure and the endorgan disease that is caused by poorly controlled high blood pressure.  Patient agreed to try amlodipine  5 mg to control her blood pressure given that her clonidine  is reduced to 0.  1 mg twice daily.  She denies SI/HI/plan.  She continues to endorse feeling depressed and anhedonia about being lonely and not having a boyfriend or partner.  She continues to be tearful.  He reports insomnia.  Per nursing staff patient received Xanax  as needed in the morning and she came back to the nurses station an hour or 2 requesting for second dose.   Sleep: Fair  Appetite:  Fair  Past  Psychiatric History: see h&P Family History:  Family History  Problem Relation Age of Onset   Diabetes Sister    Colon cancer Sister    Social History:  Social History   Substance and Sexual Activity  Alcohol Use Yes   Alcohol/week: 6.0 standard drinks of alcohol   Types: 6 Cans of beer per week   Comment: occ     Social History   Substance and Sexual Activity  Drug Use No    Social History   Socioeconomic History   Marital status: Divorced    Spouse name: Not on file   Number of children: Not on file   Years of education: Not on file   Highest education level: Not on file  Occupational History   Not on file  Tobacco Use   Smoking status: Every Day    Current packs/day: 1.00    Average packs/day: 1 pack/day for 50.5 years (50.5 ttl pk-yrs)    Types: Cigarettes    Start date: 1975   Smokeless tobacco: Never  Vaping Use   Vaping status: Every Day  Substance and Sexual Activity   Alcohol use: Yes    Alcohol/week: 6.0 standard drinks of alcohol    Types: 6 Cans of beer per week    Comment: occ   Drug use: No   Sexual activity: Not Currently  Other Topics Concern   Not on file  Social History Narrative   Not on file   Social Drivers of Health   Financial Resource Strain:  Low Risk  (05/17/2022)   Received from [facility]   Overall Financial Resource Strain (CARDIA)    Difficulty of Paying Living Expenses: Not hard at all  Food Insecurity: Food Insecurity Present (02/24/2024)   Hunger Vital Sign    Worried About Running Out of Food in the Last Year: Sometimes true    Ran Out of Food in the Last Year: Sometimes true  Transportation Needs: Unmet Transportation Needs (02/24/2024)   PRAPARE - Administrator, Civil Service (Medical): Yes    Lack of Transportation (Non-Medical): Yes  Physical Activity: Not on file  Stress: Not on file  Social Connections: Unknown (02/24/2024)   Social Connection and Isolation Panel    Frequency of Communication with  Friends and Family: Once a week    Frequency of Social Gatherings with Friends and Family: Once a week    Attends Religious Services: Patient declined    Database administrator or Organizations: Patient declined    Attends Banker Meetings: Patient declined    Marital Status: Widowed   Past Medical History:  Past Medical History:  Diagnosis Date   Anemia 05/24/2022   Arthritis    COVID-19    ETOH abuse    Hepatitis C    pt states she has been cured   Hypertension    Seizures (HCC)    not on meds    Past Surgical History:  Procedure Laterality Date   ABDOMINAL HYSTERECTOMY     partial hysterectomy   ELBOW SURGERY     ESOPHAGOGASTRODUODENOSCOPY (EGD) WITH PROPOFOL  N/A 05/24/2022   Procedure: ESOPHAGOGASTRODUODENOSCOPY (EGD) WITH PROPOFOL ;  Surgeon: Unk Corinn Skiff, MD;  Location: [facility] ENDOSCOPY;  Service: Gastroenterology;  Laterality: N/A;    Current Medications: Current Facility-Administered Medications  Medication Dose Route Frequency Provider Last Rate Last Admin   acetaminophen  (TYLENOL ) tablet 650 mg  650 mg Oral Q6H PRN Maaliyah Adolph, MD   650 mg at 02/25/24 1736   ALPRAZolam  (XANAX ) tablet 0.25 mg  0.25 mg Oral TID PRN Darion Juhasz, MD   0.25 mg at 02/26/24 0850   alum & mag hydroxide-simeth (MAALOX/MYLANTA) 200-200-20 MG/5ML suspension 30 mL  30 mL Oral Q4H PRN Donnelly Mellow, MD   30 mL at 02/26/24 0850   buprenorphine -naloxone  (SUBOXONE ) 8-2 mg per SL tablet 1 tablet  1 tablet Sublingual BID Sinead Hockman, MD   1 tablet at 02/26/24 9187   butalbital -acetaminophen -caffeine  (FIORICET ) 50-325-40 MG per tablet 1 tablet  1 tablet Oral Q6H PRN Nechama Escutia, MD   1 tablet at 02/26/24 9187   cholecalciferol  (VITAMIN D3) 25 MCG (1000 UNIT) tablet 2,000 Units  2,000 Units Oral Daily Soham Hollett, MD   2,000 Units at 02/26/24 9187   citalopram  (CELEXA ) tablet 40 mg  40 mg Oral Daily Reginaldo Hazard, MD   40 mg at 02/26/24 0813   cloNIDine   (CATAPRES ) tablet 0.1 mg  0.1 mg Oral [id number] Kable Haywood, MD   0.1 mg at 02/26/24 9187   furosemide  (LASIX ) tablet 20 mg  20 mg Oral QPC breakfast Donnelly Mellow, MD   20 mg at 02/26/24 0813   ibuprofen  (ADVIL ) tablet 600 mg  600 mg Oral Q8H PRN Cervando Durnin, MD       magnesium  hydroxide (MILK OF MAGNESIA) suspension 30 mL  30 mL Oral Daily PRN Donnelly Mellow, MD       nicotine  (NICODERM CQ  - dosed in mg/24 hours) patch 14 mg  14 mg Transdermal Daily Arnol Mcgibbon, MD  OLANZapine  (ZYPREXA ) injection 5 mg  5 mg Intramuscular TID PRN Luian Schumpert, MD       OLANZapine  (ZYPREXA ) tablet 5 mg  5 mg Oral QHS Duston Smolenski, MD       OLANZapine  zydis (ZYPREXA ) disintegrating tablet 5 mg  5 mg Oral TID PRN Hilja Kintzel, MD       ondansetron  (ZOFRAN ) tablet 4 mg  4 mg Oral Q8H PRN Jaquia Benedicto, MD       traZODone  (DESYREL ) tablet 50 mg  50 mg Oral QHS PRN Kino Dunsworth, MD   50 mg at 02/25/24 2333    Lab Results: No results found for this or any previous visit (from the past 48 hours).  Blood Alcohol level:  Lab Results  Component Value Date   [facility] <15 02/22/2024   ETH <10 09/22/2023    Metabolic Disorder Labs: Lab Results  Component Value Date   HGBA1C 5.7 (H) 07/18/2023   MPG 116.89 07/18/2023   No results found for: PROLACTIN Lab Results  Component Value Date   CHOL 180 07/18/2023   TRIG 89 07/18/2023   HDL 60 07/18/2023   CHOLHDL 3.0 07/18/2023   VLDL 18 07/18/2023   LDLCALC 102 (H) 07/18/2023    Physical Findings: AIMS:  , ,  ,  ,    CIWA:    COWS:      Psychiatric Specialty Exam:  Presentation  General Appearance:  Appropriate for Environment; Casual  Eye Contact: Fair  Speech: Clear and Coherent  Speech Volume: Normal    Mood and Affect  Mood: Anxious; Depressed  Affect: Appropriate; Depressed   Thought Process  Thought Processes: Coherent  Descriptions of Associations:Intact  Orientation:Partial  Thought  Content:Illogical  Hallucinations:Hallucinations: None  Ideas of Reference:None  Suicidal Thoughts:Suicidal Thoughts: Yes, Passive SI Passive Intent and/or Plan: Without Intent; Without Plan  Homicidal Thoughts:Homicidal Thoughts: No   Sensorium  Memory: Immediate Fair; Recent Poor; Remote Poor  Judgment: Impaired  Insight: Shallow   Executive Functions  Concentration: Fair  Attention Span: Fair  Recall:Fair  Fund of Knowledge:Fair  Language:Fair   Psychomotor Activity  Psychomotor Activity:Psychomotor Activity: Normal  Musculoskeletal: Strength & Muscle Tone: within normal limits Gait & Station: normal Assets  Assets:Communication Skills; Desire for Improvement; Resilience; Social Support    Physical Exam: Physical Exam ROS Blood pressure (!) 188/87, pulse 83, temperature 97.8 F (36.6 C), resp. rate 20, height 5' 5 (1.651 m), weight 57.8 kg, SpO2 98%. Body mass index is 21.22 kg/m.  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  Patient was recently hospitalized in June and she reappeared to the ED with worsening symptoms.  Patient is noted to be on polypharmacy and heavily sedated.  Patient needs ongoing hospitalization for medication management.   Treatment Plan Summary:   Safety and Monitoring:             -- Voluntary admission to inpatient psychiatric unit for safety, stabilization and treatment             -- Daily contact with patient to assess and evaluate symptoms and progress in treatment             -- Patient's case to be discussed in multi-disciplinary team meeting             --  Observation Level: q15 minute checks              -- Vital signs:  q12 hours             -- Precautions: suicide, elopement, and assault   2. Psychiatric Diagnoses and Treatment:              Reduced xanax  dose to 0.25 TID prn as patient is heavily sedated Reduced Zyprexa  dose to 5mg  at bedtime Celexa  40 mg for depression   -- The risks/benefits/side-effects/alternatives to this medication were discussed in detail with the patient and time was given for questions. The patient consents to medication trial.                -- Metabolic profile and EKG monitoring obtained while on an atypical antipsychotic (BMI: Lipid Panel: HbgA1c: QTc:)              -- Encouraged patient to participate in unit milieu and in scheduled group therapies                            3. Medical Issues Being Addressed: Monitor for Benzo withdrawal HTN: patient never went to see PCP and was on clonidine  0.2 mg BID - reduced clonidine  to 0.1 mg BID and will add amlodipine  to treat her HTN 4. Discharge Planning:   -- Social work and case management to assist with discharge planning and identification of hospital follow-up needs prior to discharge  -- Estimated LOS: 3-4 days  Teiara Baria, MD 02/26/2024, 1:19 PM

## 2024-02-26 NOTE — Group Note (Signed)
 Date:  02/26/2024 Time:  2:33 PM  Group Topic/Focus:  Wellness Toolbox:   The focus of this group is to discuss various aspects of wellness, balancing those aspects and exploring ways to increase the ability to experience wellness.  Patients will create a wellness toolbox for use upon discharge.    Participation Level:  Active  Participation Quality:  Attentive  Affect:  Appropriate  Cognitive:  Alert  Insight: Appropriate  Engagement in Group:  Engaged  Modes of Intervention:  Discussion  Additional Comments:  N/A  Harlene LITTIE Gavel 02/26/2024, 2:33 PM

## 2024-02-26 NOTE — Progress Notes (Signed)
 Pt is alert and oriented. Flat affect. Endorses anxiety 4/10.  C/o heartburn and difficulty sleeping. PRNs given for ingestion and insomnia. Tol well. Denies SI/HI/AVH. Q15 min checks maintained for safety. No c/o pain/discomfort noted.     02/25/24 2200  Psych Admission Type (Psych Patients Only)  Admission Status Voluntary  Psychosocial Assessment  Patient Complaints Anxiety  Eye Contact Fair  Facial Expression Flat  Affect Flat  Speech Soft  Interaction Assertive  Motor Activity Slow  Appearance/Hygiene In scrubs  Behavior Characteristics Cooperative  Mood Anxious  Thought Process  Coherency WDL  Content WDL  Delusions None reported or observed  Perception WDL  Hallucination None reported or observed  Judgment Impaired  Confusion None  Danger to Self  Current suicidal ideation? Denies

## 2024-02-26 NOTE — Group Note (Unsigned)
 Date:  02/26/2024 Time:  2:20 PM  Group Topic/Focus:  Self Care:   The focus of this group is to help patients understand the importance of self-care in order to improve or restore emotional, physical, spiritual, interpersonal, and financial health.     Participation Level:  {BHH PARTICIPATION OZCZO:77735}  Participation Quality:  {BHH PARTICIPATION QUALITY:22265}  Affect:  {BHH AFFECT:22266}  Cognitive:  {BHH COGNITIVE:22267}  Insight: {BHH Insight2:20797}  Engagement in Group:  {BHH ENGAGEMENT IN HMNLE:77731}  Modes of Intervention:  {BHH MODES OF INTERVENTION:22269}  Additional Comments:  ***  Harlene LITTIE Gavel 02/26/2024, 2:20 PM

## 2024-02-27 DIAGNOSIS — F332 Major depressive disorder, recurrent severe without psychotic features: Secondary | ICD-10-CM | POA: Diagnosis not present

## 2024-02-27 MED ORDER — ALPRAZOLAM 0.5 MG PO TABS
0.2500 mg | ORAL_TABLET | ORAL | Status: DC
  Administered 2024-02-28 – 2024-03-01 (×6): 0.25 mg via ORAL
  Filled 2024-02-27 (×6): qty 1

## 2024-02-27 MED ORDER — CLONIDINE HCL 0.1 MG PO TABS
0.0500 mg | ORAL_TABLET | ORAL | Status: DC
  Administered 2024-02-28 – 2024-03-01 (×5): 0.05 mg via ORAL
  Filled 2024-02-27 (×5): qty 1

## 2024-02-27 MED ORDER — PANTOPRAZOLE SODIUM 40 MG PO TBEC
40.0000 mg | DELAYED_RELEASE_TABLET | Freq: Every day | ORAL | Status: DC
  Administered 2024-02-27 – 2024-03-01 (×4): 40 mg via ORAL
  Filled 2024-02-27 (×4): qty 1

## 2024-02-27 MED ORDER — ALPRAZOLAM 0.5 MG PO TABS
1.0000 mg | ORAL_TABLET | Freq: Every day | ORAL | Status: DC
  Administered 2024-02-28: 1 mg via ORAL
  Filled 2024-02-27: qty 2

## 2024-02-27 NOTE — Group Note (Unsigned)
 Date:  02/27/2024 Time:  11:05 AM  Group Topic/Focus:  Libbie Therapy Outdoors with music and conversation     Participation Level:  {BHH PARTICIPATION OZCZO:77735}  Participation Quality:  {BHH PARTICIPATION QUALITY:22265}  Affect:  {BHH AFFECT:22266}  Cognitive:  {BHH COGNITIVE:22267}  Insight: {BHH Insight2:20797}  Engagement in Group:  {BHH ENGAGEMENT IN GROUP:22268}  Modes of Intervention:  {BHH MODES OF INTERVENTION:22269}  Additional Comments:  ***  Norleen SHAUNNA Bias 02/27/2024, 11:05 AM

## 2024-02-27 NOTE — Group Note (Signed)
 Date:  02/27/2024 Time:  8:52 PM  Group Topic/Focus:  Developing a Wellness Toolbox:   The focus of this group is to help patients develop a wellness toolbox with skills and strategies to promote recovery upon discharge. Healthy Communication:   The focus of this group is to discuss communication, barriers to communication, as well as healthy ways to communicate with others. Self Care:   The focus of this group is to help patients understand the importance of self-care in order to improve or restore emotional, physical, spiritual, interpersonal, and financial health.    Participation Level:  Active  Participation Quality:  Appropriate  Affect:  Appropriate  Cognitive:  Appropriate  Insight: Appropriate  Engagement in Group:  Engaged  Modes of Intervention:  Discussion  Additional Comments:  Participated in group discussion and activity. Engaged throughout group.  Francis JONETTA Boos 02/27/2024, 8:52 PM

## 2024-02-27 NOTE — Progress Notes (Signed)
   02/26/24 2200  Psych Admission Type (Psych Patients Only)  Admission Status Voluntary  Psychosocial Assessment  Patient Complaints Anxiety;Nervousness  Eye Contact Fair  Facial Expression Blank  Affect Flat  Speech Soft  Interaction Assertive  Motor Activity Slow  Appearance/Hygiene In scrubs  Behavior Characteristics Cooperative  Mood Anxious  Thought Process  Coherency WDL  Content WDL  Delusions None reported or observed  Perception Derealization  Hallucination None reported or observed  Judgment Impaired  Confusion None  Danger to Self  Current suicidal ideation? Denies

## 2024-02-27 NOTE — Progress Notes (Signed)
 [facility] MD Progress Note  02/27/2024 11:19 PM Stephanie Skinner  MRN:  [id number]  Patient Is a 68 year old female, with depression and anxiety, who has been struggling significantly since the death of her husband in 08/08/23. At that time, he passed away in hospice and she had a suicide attempt the following day by overdosing on her medications. Patient is admitted to [facility] unit with Q15 min safety monitoring. Multidisciplinary team approach is offered. Medication management; group/milieu therapy is offered.   Subjective:  Chart reviewed, case discussed in multidisciplinary meeting, patient seen during rounds.  Today on interview patient is noted to be sitting in her bed.  She kept asking and telling the provider that the antidepressant that was added to her is making her stomach very upset and she does not want to take it.  She reports daily being on Celexa  for a long time but the new antidepressant is making her feel sick to her stomach.  Provider looked through her chart and clarified and confirmed that patient has not been started on any new medications since her admission.  Zyprexa  10 mg was added in the ED as mood stabilizer which was reduced by this provider to 5 mg nightly as patient was heavily sedated throughout the day.  Patient informed the provider that she never took Zyprexa  at home so both agreed to discontinue Zyprexa  as of now.  Patient requested to go up on her Xanax  and provider educated that Xanax  nighttime dose will be increased to 1 mg which is her home medication dosage but the morning dose will still remain 0.25 mg 8 AM and 1 PM so that patient can still stay awake and participate in the groups.  Patient remains focused on her sedative-hypnotics to have any adjustment in her psychotropic medication not ready.  She continues to talk about being lonely and provider encouraged her to look into assisted living facilities as loneliness cannot be treated by any medication.  Sleep:  Fair  Appetite:  Fair  Past Psychiatric History: see h&P Family History:  Family History  Problem Relation Age of Onset   Diabetes Sister    Colon cancer Sister    Social History:  Social History   Substance and Sexual Activity  Alcohol Use Yes   Alcohol/week: 6.0 standard drinks of alcohol   Types: 6 Cans of beer per week   Comment: occ     Social History   Substance and Sexual Activity  Drug Use No    Social History   Socioeconomic History   Marital status: Divorced    Spouse name: Not on file   Number of children: Not on file   Years of education: Not on file   Highest education level: Not on file  Occupational History   Not on file  Tobacco Use   Smoking status: Every Day    Current packs/day: 1.00    Average packs/day: 1 pack/day for 50.5 years (50.5 ttl pk-yrs)    Types: Cigarettes    Start date: 1975   Smokeless tobacco: Never  Vaping Use   Vaping status: Every Day  Substance and Sexual Activity   Alcohol use: Yes    Alcohol/week: 6.0 standard drinks of alcohol    Types: 6 Cans of beer per week    Comment: occ   Drug use: No   Sexual activity: Not Currently  Other Topics Concern   Not on file  Social History Narrative   Not on file   Social Drivers  of Health   Financial Resource Strain: Low Risk  (05/17/2022)   Received from [facility]   Overall Financial Resource Strain (CARDIA)    Difficulty of Paying Living Expenses: Not hard at all  Food Insecurity: Food Insecurity Present (02/24/2024)   Hunger Vital Sign    Worried About Running Out of Food in the Last Year: Sometimes true    Ran Out of Food in the Last Year: Sometimes true  Transportation Needs: Unmet Transportation Needs (02/24/2024)   PRAPARE - Administrator, Civil Service (Medical): Yes    Lack of Transportation (Non-Medical): Yes  Physical Activity: Not on file  Stress: Not on file  Social Connections: Unknown (02/24/2024)   Social Connection and Isolation Panel     Frequency of Communication with Friends and Family: Once a week    Frequency of Social Gatherings with Friends and Family: Once a week    Attends Religious Services: Patient declined    Database administrator or Organizations: Patient declined    Attends Banker Meetings: Patient declined    Marital Status: Widowed   Past Medical History:  Past Medical History:  Diagnosis Date   Anemia 05/24/2022   Arthritis    COVID-19    ETOH abuse    Hepatitis C    pt states she has been cured   Hypertension    Seizures (HCC)    not on meds    Past Surgical History:  Procedure Laterality Date   ABDOMINAL HYSTERECTOMY     partial hysterectomy   ELBOW SURGERY     ESOPHAGOGASTRODUODENOSCOPY (EGD) WITH PROPOFOL  N/A 05/24/2022   Procedure: ESOPHAGOGASTRODUODENOSCOPY (EGD) WITH PROPOFOL ;  Surgeon: Unk Corinn Skiff, MD;  Location: [facility] ENDOSCOPY;  Service: Gastroenterology;  Laterality: N/A;    Current Medications: Current Facility-Administered Medications  Medication Dose Route Frequency Provider Last Rate Last Admin   acetaminophen  (TYLENOL ) tablet 650 mg  650 mg Oral Q6H PRN Jabree Rebert, MD   650 mg at 02/27/24 0547   [START ON 02/28/2024] ALPRAZolam  (XANAX ) tablet 0.25 mg  0.25 mg Oral 2 times per day Donnelly Mellow, MD       ALPRAZolam  (XANAX ) tablet 1 mg  1 mg Oral QHS Aramis Weil, MD       alum & mag hydroxide-simeth (MAALOX/MYLANTA) 200-200-20 MG/5ML suspension 30 mL  30 mL Oral Q4H PRN Beckam Abdulaziz, MD   30 mL at 02/27/24 9167   amLODipine  (NORVASC ) tablet 5 mg  5 mg Oral Daily Shela Esses, MD   5 mg at 02/27/24 0830   buprenorphine -naloxone  (SUBOXONE ) 8-2 mg per SL tablet 1 tablet  1 tablet Sublingual BID Brandn Mcgath, MD   1 tablet at 02/27/24 2156   butalbital -acetaminophen -caffeine  (FIORICET ) 50-325-40 MG per tablet 1 tablet  1 tablet Oral Q6H PRN Tina Gruner, MD   1 tablet at 02/27/24 1852   cholecalciferol  (VITAMIN D3) 25 MCG (1000 UNIT) tablet  2,000 Units  2,000 Units Oral Daily Atheena Spano, MD   2,000 Units at 02/27/24 0830   citalopram  (CELEXA ) tablet 40 mg  40 mg Oral Daily Ruthetta Koopmann, MD   40 mg at 02/27/24 9170   cloNIDine  (CATAPRES ) tablet 0.1 mg  0.1 mg Oral [id number] Chera Slivka, MD   0.1 mg at 02/27/24 1617   furosemide  (LASIX ) tablet 20 mg  20 mg Oral QPC breakfast Treysen Sudbeck, MD   20 mg at 02/27/24 0830   ibuprofen  (ADVIL ) tablet 600 mg  600 mg Oral Q8H PRN Mattix Imhof,  Nirel Babler, MD   600 mg at 02/27/24 1532   magnesium  hydroxide (MILK OF MAGNESIA) suspension 30 mL  30 mL Oral Daily PRN Donnelly Mellow, MD       nicotine  (NICODERM CQ  - dosed in mg/24 hours) patch 14 mg  14 mg Transdermal Daily Dorcus Riga, MD       OLANZapine  (ZYPREXA ) injection 5 mg  5 mg Intramuscular TID PRN Arlissa Monteverde, MD       OLANZapine  zydis (ZYPREXA ) disintegrating tablet 5 mg  5 mg Oral TID PRN Leatrice Parilla, MD       ondansetron  (ZOFRAN ) tablet 4 mg  4 mg Oral Q8H PRN Dreyton Roessner, MD       pantoprazole  (PROTONIX ) EC tablet 40 mg  40 mg Oral Daily Krystle Oberman, MD   40 mg at 02/27/24 1353   traZODone  (DESYREL ) tablet 50 mg  50 mg Oral QHS PRN Orville Mena, MD   50 mg at 02/27/24 2156    Lab Results: No results found for this or any previous visit (from the past 48 hours).  Blood Alcohol level:  Lab Results  Component Value Date   [facility] <15 02/22/2024   ETH <10 09/22/2023    Metabolic Disorder Labs: Lab Results  Component Value Date   HGBA1C 5.7 (H) 07/18/2023   MPG 116.89 07/18/2023   No results found for: PROLACTIN Lab Results  Component Value Date   CHOL 180 07/18/2023   TRIG 89 07/18/2023   HDL 60 07/18/2023   CHOLHDL 3.0 07/18/2023   VLDL 18 07/18/2023   LDLCALC 102 (H) 07/18/2023    Physical Findings: AIMS:  , ,  ,  ,    CIWA:    COWS:      Psychiatric Specialty Exam:  Presentation  General Appearance:  Appropriate for Environment; Casual  Eye Contact: Fair  Speech: Clear and  Coherent  Speech Volume: Normal    Mood and Affect  Mood: Anxious; Depressed  Affect: Appropriate; Depressed   Thought Process  Thought Processes: Coherent  Descriptions of Associations:Intact  Orientation:Partial  Thought Content:Illogical  Hallucinations:Hallucinations: None  Ideas of Reference:None  Suicidal Thoughts:Suicidal Thoughts: Yes, Passive SI Passive Intent and/or Plan: Without Intent; Without Plan  Homicidal Thoughts:Homicidal Thoughts: No   Sensorium  Memory: Immediate Fair; Recent Poor; Remote Poor  Judgment: Impaired  Insight: Shallow   Executive Functions  Concentration: Fair  Attention Span: Fair  Recall:Fair  Fund of Knowledge:Fair  Language:Fair   Psychomotor Activity  Psychomotor Activity:Psychomotor Activity: Normal  Musculoskeletal: Strength & Muscle Tone: within normal limits Gait & Station: normal Assets  Assets:Communication Skills; Desire for Improvement; Resilience; Social Support    Physical Exam: Physical Exam Vitals and nursing note reviewed.    ROS Blood pressure (!) 113/55, pulse 75, temperature (!) 97.5 F (36.4 C), temperature source Oral, resp. rate 18, height 5' 5 (1.651 m), weight 57.8 kg, SpO2 97%. Body mass index is 21.22 kg/m.  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  Patient was recently hospitalized in June and she reappeared to the ED with worsening symptoms.  Patient is noted to be on polypharmacy and heavily sedated.  Patient needs ongoing hospitalization for medication management.   Treatment Plan Summary:   Safety and Monitoring:             --  Voluntary admission to  inpatient psychiatric unit for safety, stabilization and treatment             -- Daily contact with patient to assess and evaluate symptoms and progress in treatment             -- Patient's case to be discussed in multi-disciplinary team meeting             -- Observation Level: q15 minute checks             -- Vital signs:  q12 hours             -- Precautions: suicide, elopement, and assault   2. Psychiatric Diagnoses and Treatment:              Reduced xanax  dose to 0.25 TID prn as patient is heavily sedated Reduced Zyprexa  dose to 5mg  at bedtime Celexa  40 mg for depression   -- The risks/benefits/side-effects/alternatives to this medication were discussed in detail with the patient and time was given for questions. The patient consents to medication trial.                -- Metabolic profile and EKG monitoring obtained while on an atypical antipsychotic (BMI: Lipid Panel: HbgA1c: QTc:)              -- Encouraged patient to participate in unit milieu and in scheduled group therapies                            3. Medical Issues Being Addressed: Monitor for Benzo withdrawal HTN: patient never went to see PCP and was on clonidine  0.2 mg BID - reduced clonidine  to 0.1 mg BID and will add amlodipine  to treat her HTN 4. Discharge Planning:   -- Social work and case management to assist with discharge planning and identification of hospital follow-up needs prior to discharge  -- Estimated LOS: 3-4 days  Allyn Foil, MD 02/27/2024, 11:19 PM

## 2024-02-27 NOTE — Plan of Care (Signed)
  Problem: Education: Goal: Utilization of techniques to improve thought processes will improve Outcome: Progressing   Problem: Coping: Goal: Will verbalize feelings Outcome: Progressing   Problem: Health Behavior/Discharge Planning: Goal: Ability to make decisions will improve Outcome: Progressing

## 2024-02-27 NOTE — Progress Notes (Signed)
 Patient is pleasant and cooperative.  Endorses anxiety.  Restless.  Denies SI/HI and AVH. Pain rated  9/10 (headache). Patient reports she did not sleep well.  Night RN reported the patient slept soundly all night.   Declined Suboxone  and nicotine  patch.  Compliant with other scheduled medications. PRN medications given for anxiety and heartburn. 15 min checks in place for safety.  Patient is present in the milieu.  Appropriate interaction with peers.    BP high this morning when re-checked.  Dr. JINNY made aware.

## 2024-02-27 NOTE — Group Note (Unsigned)
 Date:  02/27/2024 Time:  11:13 AM  Group Topic/Focus:  Fresh air therapyoutdoors with music and conversation.     Participation Level:  {BHH PARTICIPATION OZCZO:77735}  Participation Quality:  {BHH PARTICIPATION QUALITY:22265}  Affect:  {BHH AFFECT:22266}  Cognitive:  {BHH COGNITIVE:22267}  Insight: {BHH Insight2:20797}  Engagement in Group:  {BHH ENGAGEMENT IN HMNLE:77731}  Modes of Intervention:  {BHH MODES OF INTERVENTION:22269}  Additional Comments:  ***  Stephanie Skinner 02/27/2024, 11:13 AM

## 2024-02-27 NOTE — Group Note (Unsigned)
 Date:  02/27/2024 Time:  2:11 PM  Group Topic/Focus:  Fresh air Therapy Outdoors with Music and conversation.     Participation Level:  {BHH PARTICIPATION OZCZO:77735}  Participation Quality:  {BHH PARTICIPATION QUALITY:22265}  Affect:  {BHH AFFECT:22266}  Cognitive:  {BHH COGNITIVE:22267}  Insight: {BHH Insight2:20797}  Engagement in Group:  {BHH ENGAGEMENT IN HMNLE:77731}  Modes of Intervention:  {BHH MODES OF INTERVENTION:22269}  Additional Comments:  ***  Norleen SHAUNNA Bias 02/27/2024, 2:11 PM

## 2024-02-27 NOTE — Group Note (Unsigned)
 Date:  02/27/2024 Time:  11:18 AM  Group Topic/Focus:  Fresh airTherapy with music.     Participation Level:  {BHH PARTICIPATION OZCZO:77735}  Participation Quality:  {BHH PARTICIPATION QUALITY:22265}  Affect:  {BHH AFFECT:22266}  Cognitive:  {BHH COGNITIVE:22267}  Insight: {BHH Insight2:20797}  Engagement in Group:  {BHH ENGAGEMENT IN HMNLE:77731}  Modes of Intervention:  {BHH MODES OF INTERVENTION:22269}  Additional Comments:  ***  Stephanie Skinner 02/27/2024, 11:18 AM

## 2024-02-27 NOTE — Group Note (Signed)
 Date:  02/27/2024 Time:  2:19 PM  Group Topic/Focus:  Fresh air Therapy Outdoors with Music and conversation.    Participation Level:  Active  Participation Quality:  Appropriate  Affect:  Appropriate  Cognitive:  Appropriate  Insight: Appropriate  Engagement in Group:  Engaged  Modes of Intervention:  Socialization  Additional Comments:  none  Norleen SHAUNNA Bias 02/27/2024, 2:19 PM

## 2024-02-27 NOTE — Group Note (Unsigned)
 Date:  02/27/2024 Time:  11:00 AM  Group Topic/Focus:  Fresh air Therapy Outdoors with Music and Conversation.     Participation Level:  {BHH PARTICIPATION OZCZO:77735}  Participation Quality:  {BHH PARTICIPATION QUALITY:22265}  Affect:  {BHH AFFECT:22266}  Cognitive:  {BHH COGNITIVE:22267}  Insight: {BHH Insight2:20797}  Engagement in Group:  {BHH ENGAGEMENT IN HMNLE:77731}  Modes of Intervention:  {BHH MODES OF INTERVENTION:22269}  Additional Comments:  ***  Norleen SHAUNNA Bias 02/27/2024, 11:00 AM
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
## 2024-02-27 NOTE — Plan of Care (Signed)
   Problem: Education: Goal: Knowledge of the prescribed therapeutic regimen will improve Outcome: Progressing   Problem: Activity: Goal: Interest or engagement in leisure activities will improve Outcome: Progressing   Problem: Health Behavior/Discharge Planning: Goal: Compliance with therapeutic regimen will improve Outcome: Progressing

## 2024-02-27 NOTE — Group Note (Signed)
 Recreation Therapy Group Note   Group Topic:Stress Management  Group Date: 02/27/2024 Start Time: 1400 End Time: 1445 Facilitators: Celestia Jeoffrey BRAVO, LRT, CTRS Location: Dayroom  Group Description: PMR (Progressive Muscle Relaxation). LRT educates patients on what PMR is and the benefits that come from it. Patients are asked to sit with their feet flat on the floor while sitting up and all the way back in their chair, if possible. LRT and pts follow a prompt through a speaker that requires you to tense and release different muscles in their body and focus on their breathing. During session, lights are off and soft music is being played. Pts are given a stress ball to use if needed.  Goal Area(s) Addressed:  Patients will be able to describe progressive muscle relaxation.  Patient will practice using relaxation technique. Patient will identify a new coping skill.  Patient will follow multistep directions to reduce anxiety and stress.   Affect/Mood: Appropriate   Participation Level: Active and Engaged   Participation Quality: Independent   Behavior: Calm and Cooperative   Speech/Thought Process: Coherent   Insight: Good   Judgement: Good   Modes of Intervention: Clarification, Education, and Exploration   Patient Response to Interventions:  Attentive, Engaged, Interested , and Receptive   Education Outcome:  Acknowledges education   Clinical Observations/Individualized Feedback: Evyn was active in their participation of session activities and group discussion. Pt identified I do these every night, they help me fall asleep. Pt completed all exercises as prompted. Pt interacted well with LRT and peers duration of session.    Plan: Continue to engage patient in RT group sessions 2-3x/week.   Jeoffrey BRAVO Celestia, LRT, CTRS 02/27/2024 3:06 PM

## 2024-02-28 DIAGNOSIS — F332 Major depressive disorder, recurrent severe without psychotic features: Secondary | ICD-10-CM | POA: Diagnosis not present

## 2024-02-28 MED ORDER — HYDRALAZINE HCL 25 MG PO TABS: 25.0000 mg | ORAL_TABLET | Freq: Three times a day (TID) | ORAL | Status: DC | PRN

## 2024-02-28 MED ORDER — AMLODIPINE BESYLATE 5 MG PO TABS
10.0000 mg | ORAL_TABLET | Freq: Every day | ORAL | Status: DC
  Administered 2024-02-29 – 2024-03-01 (×2): 10 mg via ORAL
  Filled 2024-02-28 (×2): qty 2

## 2024-02-28 MED ORDER — ALPRAZOLAM 0.5 MG PO TABS
0.5000 mg | ORAL_TABLET | Freq: Every day | ORAL | Status: DC
  Administered 2024-02-28 – 2024-02-29 (×2): 0.5 mg via ORAL
  Filled 2024-02-28 (×2): qty 1

## 2024-02-28 NOTE — Plan of Care (Signed)
  Problem: Education: Goal: Knowledge of the prescribed therapeutic regimen will improve Outcome: Progressing   Problem: Coping: Goal: Coping ability will improve Outcome: Progressing   Problem: Self-Concept: Goal: Level of anxiety will decrease Outcome: Progressing

## 2024-02-28 NOTE — Progress Notes (Signed)
 Patient lost her balance and fell on her bottom while walking to the dayroom.  Fall was witnessed by nursing staff.  Patient did not hit her head.  No observed injuries noted or voiced.  Denies pain.  No redness, swelling or bruising observed.  D.r J made aware.  Orthostatic BP obtained. Yellow armband and front wheel walker given to patient.  Bed alarm will be set.

## 2024-02-28 NOTE — Progress Notes (Signed)
 Patient is pleasant and cooperative.  Endorses anxiety.  Denies SI/HI and AVH.  Denies depression.  Pain rated 7/10 (headache).    Refused Suboxone  and nicotine  patch.  PRN medication given for headache. 15 min checks in place for safety.  Patient is present in the milieu.  Appropriate interaction with peers and staff.

## 2024-02-28 NOTE — Progress Notes (Signed)
 [facility] MD Progress Note  02/28/2024 1:51 PM Stephanie Skinner  MRN:  [id number]  Patient Is a 68 year old female, with depression and anxiety, who has been struggling significantly since the death of her husband in 07/12/2023. At that time, he passed away in hospice and she had a suicide attempt the following day by overdosing on her medications. Patient is admitted to [facility] unit with Q15 min safety monitoring. Multidisciplinary team approach is offered. Medication management; group/milieu therapy is offered.   Subjective:  Chart reviewed, case discussed in multidisciplinary meeting, patient seen during rounds.  Patient is noted to be resting in bed.  She informs the provider that she received extra dose of Xanax  late in the night at 11:00 as a new order was put in.  She denies feeling any dizziness.  She continues to talk about how other medications are new her medications because acid reflux.  She continues to request to increase the dose of Xanax  in the afternoon.  Provider educated her about her low blood pressure and the polypharmacy that she has been taking including Xanax , Suboxone , clonidine .  Patient denies SI/HI/plan.  She wants to get connected with intensive outpatient services where she can go to programs and groups.  She denies auditory/visual hallucinations.  Per nursing report patient has fair appetite and is participating in groups and is getting along very well with other peers.   Sleep: Fair  Appetite:  Fair  Past Psychiatric History: see h&P Family History:  Family History  Problem Relation Age of Onset   Diabetes Sister    Colon cancer Sister    Social History:  Social History   Substance and Sexual Activity  Alcohol Use Yes   Alcohol/week: 6.0 standard drinks of alcohol   Types: 6 Cans of beer per week   Comment: occ     Social History   Substance and Sexual Activity  Drug Use No    Social History   Socioeconomic History   Marital status: Divorced     Spouse name: Not on file   Number of children: Not on file   Years of education: Not on file   Highest education level: Not on file  Occupational History   Not on file  Tobacco Use   Smoking status: Every Day    Current packs/day: 1.00    Average packs/day: 1 pack/day for 50.5 years (50.5 ttl pk-yrs)    Types: Cigarettes    Start date: 1975   Smokeless tobacco: Never  Vaping Use   Vaping status: Every Day  Substance and Sexual Activity   Alcohol use: Yes    Alcohol/week: 6.0 standard drinks of alcohol    Types: 6 Cans of beer per week    Comment: occ   Drug use: No   Sexual activity: Not Currently  Other Topics Concern   Not on file  Social History Narrative   Not on file   Social Drivers of Health   Financial Resource Strain: Low Risk  (05/17/2022)   Received from [facility]   Overall Financial Resource Strain (CARDIA)    Difficulty of Paying Living Expenses: Not hard at all  Food Insecurity: Food Insecurity Present (02/24/2024)   Hunger Vital Sign    Worried About Running Out of Food in the Last Year: Sometimes true    Ran Out of Food in the Last Year: Sometimes true  Transportation Needs: Unmet Transportation Needs (02/24/2024)   PRAPARE - Administrator, Civil Service (Medical):  Yes    Lack of Transportation (Non-Medical): Yes  Physical Activity: Not on file  Stress: Not on file  Social Connections: Unknown (02/24/2024)   Social Connection and Isolation Panel    Frequency of Communication with Friends and Family: Once a week    Frequency of Social Gatherings with Friends and Family: Once a week    Attends Religious Services: Patient declined    Database administrator or Organizations: Patient declined    Attends Banker Meetings: Patient declined    Marital Status: Widowed   Past Medical History:  Past Medical History:  Diagnosis Date   Anemia 05/24/2022   Arthritis    COVID-19    ETOH abuse    Hepatitis C    pt states she has been  cured   Hypertension    Seizures (HCC)    not on meds    Past Surgical History:  Procedure Laterality Date   ABDOMINAL HYSTERECTOMY     partial hysterectomy   ELBOW SURGERY     ESOPHAGOGASTRODUODENOSCOPY (EGD) WITH PROPOFOL  N/A 05/24/2022   Procedure: ESOPHAGOGASTRODUODENOSCOPY (EGD) WITH PROPOFOL ;  Surgeon: Unk Corinn Skiff, MD;  Location: [facility] ENDOSCOPY;  Service: Gastroenterology;  Laterality: N/A;    Current Medications: Current Facility-Administered Medications  Medication Dose Route Frequency Provider Last Rate Last Admin   acetaminophen  (TYLENOL ) tablet 650 mg  650 mg Oral Q6H PRN Pope Brunty, MD   650 mg at 02/28/24 9372   ALPRAZolam  (XANAX ) tablet 0.25 mg  0.25 mg Oral 2 times per day Donnelly Mellow, MD   0.25 mg at 02/28/24 1336   ALPRAZolam  (XANAX ) tablet 1 mg  1 mg Oral QHS Neziah Vogelgesang, MD   1 mg at 02/28/24 0023   alum & mag hydroxide-simeth (MAALOX/MYLANTA) 200-200-20 MG/5ML suspension 30 mL  30 mL Oral Q4H PRN Darrius Montano, MD   30 mL at 02/27/24 9167   amLODipine  (NORVASC ) tablet 5 mg  5 mg Oral Daily Jamesha Ellsworth, MD   5 mg at 02/28/24 9153   buprenorphine -naloxone  (SUBOXONE ) 8-2 mg per SL tablet 1 tablet  1 tablet Sublingual BID Joleah Kosak, MD   1 tablet at 02/27/24 2156   butalbital -acetaminophen -caffeine  (FIORICET ) 50-325-40 MG per tablet 1 tablet  1 tablet Oral Q6H PRN Kosisochukwu Goldberg, MD   1 tablet at 02/28/24 1035   cholecalciferol  (VITAMIN D3) 25 MCG (1000 UNIT) tablet 2,000 Units  2,000 Units Oral Daily Hakeen Shipes, MD   2,000 Units at 02/28/24 0845   citalopram  (CELEXA ) tablet 40 mg  40 mg Oral Daily Merelin Human, MD   40 mg at 02/28/24 0846   cloNIDine  (CATAPRES ) tablet 0.05 mg  0.05 mg Oral [id number] Joan Herschberger, MD   0.05 mg at 02/28/24 9153   furosemide  (LASIX ) tablet 20 mg  20 mg Oral QPC breakfast Donnelly Mellow, MD   20 mg at 02/28/24 9153   ibuprofen  (ADVIL ) tablet 600 mg  600 mg Oral Q8H PRN Quinn Quam, MD   600 mg  at 02/27/24 1532   magnesium  hydroxide (MILK OF MAGNESIA) suspension 30 mL  30 mL Oral Daily PRN Donnelly Mellow, MD       nicotine  (NICODERM CQ  - dosed in mg/24 hours) patch 14 mg  14 mg Transdermal Daily Heydy Montilla, MD       OLANZapine  (ZYPREXA ) injection 5 mg  5 mg Intramuscular TID PRN Remberto Lienhard, MD       OLANZapine  zydis (ZYPREXA ) disintegrating tablet 5 mg  5 mg Oral TID PRN Tayte Childers,  Katlynne Mckercher, MD       ondansetron  (ZOFRAN ) tablet 4 mg  4 mg Oral Q8H PRN Kiyra Slaubaugh, MD       pantoprazole  (PROTONIX ) EC tablet 40 mg  40 mg Oral Daily Cay Kath, MD   40 mg at 02/28/24 0845   traZODone  (DESYREL ) tablet 50 mg  50 mg Oral QHS PRN Alfrieda Tarry, MD   50 mg at 02/27/24 2156    Lab Results: No results found for this or any previous visit (from the past 48 hours).  Blood Alcohol level:  Lab Results  Component Value Date   [facility] <15 02/22/2024   ETH <10 09/22/2023    Metabolic Disorder Labs: Lab Results  Component Value Date   HGBA1C 5.7 (H) 07/18/2023   MPG 116.89 07/18/2023   No results found for: PROLACTIN Lab Results  Component Value Date   CHOL 180 07/18/2023   TRIG 89 07/18/2023   HDL 60 07/18/2023   CHOLHDL 3.0 07/18/2023   VLDL 18 07/18/2023   LDLCALC 102 (H) 07/18/2023    Physical Findings: AIMS:  , ,  ,  ,    CIWA:    COWS:      Psychiatric Specialty Exam:  Presentation  General Appearance:  Appropriate for Environment; Casual  Eye Contact: Fair  Speech: Clear and Coherent  Speech Volume: Normal    Mood and Affect  Mood: Anxious; Depressed  Affect: Appropriate; Depressed   Thought Process  Thought Processes: Coherent  Descriptions of Associations:Intact  Orientation:Partial  Thought Content:Illogical  Hallucinations: Denies  Ideas of Reference:None  Suicidal Thoughts: Denies  Homicidal Thoughts: Denies   Sensorium  Memory: Immediate Fair; Recent Poor; Remote  Poor  Judgment: Impaired  Insight: Shallow   Executive Functions  Concentration: Fair  Attention Span: Fair  Recall:Fair  Fund of Knowledge:Fair  Language:Fair   Psychomotor Activity  Psychomotor Activity: Normal  Musculoskeletal: Strength & Muscle Tone: within normal limits Gait & Station: normal Assets  Assets:Communication Skills; Desire for Improvement; Resilience; Social Support    Physical Exam: Physical Exam Vitals and nursing note reviewed.    ROS Blood pressure (!) 191/87, pulse 84, temperature 97.7 F (36.5 C), resp. rate 16, height 5' 5 (1.651 m), weight 57.8 kg, SpO2 97%. Body mass index is 21.22 kg/m.  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  Patient was recently hospitalized in June and she reappeared to the ED with worsening symptoms.  Patient is noted to be on polypharmacy and heavily sedated.  Patient needs ongoing hospitalization for medication management.   Treatment Plan Summary:   Safety and Monitoring:             -- Voluntary admission to inpatient psychiatric unit for safety, stabilization and treatment             -- Daily contact with patient to assess and evaluate symptoms and progress in treatment             -- Patient's case to be discussed in multi-disciplinary team meeting             -- Observation Level: q15 minute checks             -- Vital signs:  q12 hours             --  Precautions: suicide, elopement, and assault   2. Psychiatric Diagnoses and Treatment:             Changed xanax  dose to 0.25 8 AM, 1 PM and 0.5 mg nightly as patient is unstable on her feet with 1 mg nightly of Xanax  Discontinued Zyprexa   dose to 5mg  at bedtime Celexa  40 mg for depression Suboxone  8 mg twice daily-patient is declining at least 1 dose during the daytime -- The risks/benefits/side-effects/alternatives to this medication were discussed in detail with the patient and time was given for questions. The patient consents to medication trial.                -- Metabolic profile and EKG monitoring obtained while on an atypical antipsychotic (BMI: Lipid Panel: HbgA1c: QTc:)              -- Encouraged patient to participate in unit milieu and in scheduled group therapies                            3. Medical Issues Being Addressed: Monitor for Benzo withdrawal HTN: patient never went to see PCP and was on clonidine  0.2 mg BID - reduced clonidine  to 0.05 mg BID and will increase amlodipine  to treat her HTN 4. Discharge Planning:   -- Social work and case management to assist with discharge planning and identification of hospital follow-up needs prior to discharge  -- Estimated LOS: 3-4 days  Allyn Foil, MD 02/28/2024, 1:51 PM

## 2024-02-28 NOTE — Group Note (Signed)
 Date:  02/28/2024 Time:  10:46 AM  Group Topic/Focus:  Fresh air Therapy with Music.    Participation Level:  Active  Participation Quality:  Appropriate  Affect:  Appropriate  Cognitive:  Appropriate  Insight: Appropriate  Engagement in Group:  Engaged  Modes of Intervention:  Socialization and Music  Additional Comments:  none  Stephanie Skinner Bias 02/28/2024, 10:46 AM

## 2024-02-28 NOTE — Group Note (Signed)
 Date:  02/28/2024 Time:  9:17 PM  Group Topic/Focus:  Overcoming Stress:   The focus of this group is to define stress and help patients assess their triggers.    Participation Level:  Minimal  Participation Quality:  Inattentive  Affect:  Flat  Cognitive:  Alert  Insight: None  Engagement in Group:  Distracting  Modes of Intervention:  Exploration  Additional Comments:    Stephanie Skinner 02/28/2024, 9:17 PM

## 2024-02-28 NOTE — Plan of Care (Signed)

## 2024-02-28 NOTE — [facility] Suicide Risk Assessment (Signed)
 [facility] INPATIENT:  Family/Significant Other Suicide Prevention Education  Suicide Prevention Education:  Contact Attempts: 1st, [name] [phone] has been identified by the patient as the family member/significant other with whom the patient will be residing, and identified as the person(s) who will aid the patient in the event of a mental health crisis.  With written consent from the patient, two attempts were made to provide suicide prevention education, prior to and/or following the patient's discharge.  We were unsuccessful in providing suicide prevention education.  A suicide education pamphlet was given to the patient to share with family/significant other. Spoke with nephew [name] that answered that phone number and he said he would have Dwayne return the call.  Date and time of first attempt:12:05pm/02/28/2024 Stephanie Skinner 02/28/2024, 12:04 PM

## 2024-02-28 NOTE — Progress Notes (Signed)
 Pt alert and oriented. OOB to nursing station. States she's having difficulty sleeping with racing thoughts. Xanax  1 mg po given as scheduled, per MD order. Tol well. Q 15 min checks maintained for safety. Denies SI/HI/AVH. No c/o pain/discomfort noted.

## 2024-02-28 NOTE — Progress Notes (Signed)
 c/o H/A. Pain scale 10/10. Tylenol  650 mg po given PRN at 0600 for headache. Pt resting comfortable in bed with eyes closed. RR even and unlabored. Q 15 min checks maintained for safety.

## 2024-02-28 NOTE — Progress Notes (Signed)
   02/27/24 2200  Psych Admission Type (Psych Patients Only)  Admission Status Voluntary  Psychosocial Assessment  Patient Complaints Anxiety  Eye Contact Fair  Facial Expression Anxious  Affect Anxious  Speech Logical/coherent  Interaction Assertive  Motor Activity Slow  Appearance/Hygiene In scrubs  Behavior Characteristics Appropriate to situation  Mood Pleasant  Thought Process  Coherency WDL  Content WDL  Delusions None reported or observed  Perception WDL  Hallucination None reported or observed  Judgment Impaired  Confusion None  Danger to Self  Current suicidal ideation? Denies

## 2024-02-29 DIAGNOSIS — F332 Major depressive disorder, recurrent severe without psychotic features: Secondary | ICD-10-CM | POA: Diagnosis not present

## 2024-02-29 NOTE — Group Note (Signed)
 LCSW Group Therapy Note  Group Date: 02/29/2024 Start Time: 1400 End Time: 1445   Type of Therapy and Topic:  Group Therapy - Healthy vs Unhealthy Coping Skills  Participation Level:  Active   Description of Group The focus of this group was to determine what unhealthy coping techniques typically are used by group members and what healthy coping techniques would be helpful in coping with various problems. Patients were guided in becoming aware of the differences between healthy and unhealthy coping techniques. Patients were asked to identify 2-3 healthy coping skills they would like to learn to use more effectively.  Therapeutic Goals Patients learned that coping is what human beings do all day long to deal with various situations in their lives Patients defined and discussed healthy vs unhealthy coping techniques Patients identified their preferred coping techniques and identified whether these were healthy or unhealthy Patients determined 2-3 healthy coping skills they would like to become more familiar with and use more often. Patients provided support and ideas to each other   Summary of Patient Progress:  During group, patient proved open to input from peers and feedback from CSW. Patient demonstrated positive insight into the subject matter, was respectful of peers, and participated throughout the entire session.   Therapeutic Modalities Cognitive Behavioral Therapy Motivational Interviewing  Aldo CHRISTELLA Niece, [place] 02/29/2024  3:01 PM

## 2024-02-29 NOTE — Progress Notes (Signed)
 Patient alert and oriented. Flat affect. Visible on the unit. Interacting with peers and staff. Attends group. Endorses anxiety 10/10. C/o right shoulder pain and difficulty sleeping. Pain scale 10/10. PRNs given for insomnia and pain/discomfort. Q 15 min checks maintained for safety. Denies SI/HI/AVH.

## 2024-02-29 NOTE — Plan of Care (Signed)
  Problem: Education: Goal: Utilization of techniques to improve thought processes will improve 02/29/2024 1037 by Wright Benton CROME, RN Outcome: Progressing 02/29/2024 1037 by Wright Benton CROME, RN Outcome: Not Progressing Goal: Knowledge of the prescribed therapeutic regimen will improve 02/29/2024 1037 by Wright Benton CROME, RN Outcome: Progressing 02/29/2024 1037 by Wright Benton CROME, RN Outcome: Not Progressing   Problem: Activity: Goal: Interest or engagement in leisure activities will improve 02/29/2024 1037 by Wright Benton CROME, RN Outcome: Progressing 02/29/2024 1037 by Wright Benton CROME, RN Outcome: Not Progressing Goal: Imbalance in normal sleep/wake cycle will improve 02/29/2024 1037 by Wright Benton CROME, RN Outcome: Progressing 02/29/2024 1037 by Wright Benton CROME, RN Outcome: Not Progressing   Problem: Coping: Goal: Coping ability will improve 02/29/2024 1037 by Wright Benton CROME, RN Outcome: Progressing 02/29/2024 1037 by Wright Benton CROME, RN Outcome: Not Progressing Goal: Will verbalize feelings 02/29/2024 1037 by Wright Benton CROME, RN Outcome: Progressing 02/29/2024 1037 by Wright Benton CROME, RN Outcome: Not Progressing   Problem: Health Behavior/Discharge Planning: Goal: Ability to make decisions will improve 02/29/2024 1037 by Wright Benton CROME, RN Outcome: Progressing 02/29/2024 1037 by Wright Benton CROME, RN Outcome: Not Progressing Goal: Compliance with therapeutic regimen will improve 02/29/2024 1037 by Wright Benton CROME, RN Outcome: Progressing 02/29/2024 1037 by Wright Benton CROME, RN Outcome: Not Progressing   Problem: Role Relationship: Goal: Will demonstrate positive changes in social behaviors and relationships 02/29/2024 1037 by Wright Benton CROME, RN Outcome: Progressing 02/29/2024 1037 by Wright Benton CROME, RN Outcome: Not Progressing   Problem: Safety: Goal: Ability to disclose and discuss suicidal ideas will improve 02/29/2024 1037 by Wright Benton CROME, RN Outcome: Progressing 02/29/2024 1037 by Wright Benton CROME, RN Outcome: Not Progressing Goal: Ability to identify and utilize support systems that promote safety will improve 02/29/2024 1037 by Wright Benton CROME, RN Outcome: Progressing 02/29/2024 1037 by Wright Benton CROME, RN Outcome: Not Progressing   Problem: Self-Concept: Goal: Will verbalize positive feelings about self 02/29/2024 1037 by Wright Benton CROME, RN Outcome: Progressing 02/29/2024 1037 by Wright Benton CROME, RN Outcome: Not Progressing Goal: Level of anxiety will decrease 02/29/2024 1037 by Wright Benton CROME, RN Outcome: Progressing 02/29/2024 1037 by Wright Benton CROME, RN Outcome: Not Progressing

## 2024-02-29 NOTE — Progress Notes (Signed)
   02/29/24 1000  Psych Admission Type (Psych Patients Only)  Admission Status Voluntary  Psychosocial Assessment  Patient Complaints Anxiety;Depression  Eye Contact Brief  Facial Expression Animated  Affect Anxious  Speech Logical/coherent  Interaction Assertive  Motor Activity Slow  Appearance/Hygiene In scrubs  Behavior Characteristics Anxious  Mood Anxious  Thought Process  Coherency WDL  Content WDL  Delusions None reported or observed  Perception WDL  Hallucination None reported or observed  Judgment Impaired  Confusion None  Danger to Self  Current suicidal ideation? Denies

## 2024-02-29 NOTE — Progress Notes (Signed)
 [facility] MD Progress Note  02/29/2024 12:41 PM Stephanie Skinner  MRN:  [id number]  Patient Is a 68 year old female, with depression and anxiety, who has been struggling significantly since the death of her husband in 2023/07/30. At that time, he passed away in hospice and she had a suicide attempt the following day by overdosing on her medications. Patient is admitted to [facility] unit with Q15 min safety monitoring. Multidisciplinary team approach is offered. Medication management; group/milieu therapy is offered.   Subjective:  Chart reviewed, case discussed in multidisciplinary meeting, patient seen during rounds.  Patient is noted to be standing at the nurses station.  She minimizes the fall that she incurred yesterday and reports that the socks on the feet slid and she fell on her bottom.  She denies hitting her head or losing any consciousness.  Provider explained and educated patient extensively on the polypharmacy of medication she is at including Xanax , Suboxone , clonidine .  Patient reports that her anxiety needs that Xanax  and that she is having some headaches.  Provider educated patient as the dosages already used to since admission the headaches from clonidine  withdrawal is possible but we are going to stay at low doses of all her medication to maintain safety.  Patient educated about following up with PCP and psychiatrist are working together as a team and managing her medications.  She denies feeling depressed or having any panic attacks today she denies SI/HI/plan and denies any hallucinations.  Per nursing report patient is noted to be engaging in groups and with peers.  She talks about feeling lonely and updated the provider that she was informed that her daughter will be staying with her after the discharge. Sleep: Fair  Appetite:  Fair  Past Psychiatric History: see h&P Family History:  Family History  Problem Relation Age of Onset   Diabetes Sister    Colon cancer Sister    Social  History:  Social History   Substance and Sexual Activity  Alcohol Use Yes   Alcohol/week: 6.0 standard drinks of alcohol   Types: 6 Cans of beer per week   Comment: occ     Social History   Substance and Sexual Activity  Drug Use No    Social History   Socioeconomic History   Marital status: Divorced    Spouse name: Not on file   Number of children: Not on file   Years of education: Not on file   Highest education level: Not on file  Occupational History   Not on file  Tobacco Use   Smoking status: Every Day    Current packs/day: 1.00    Average packs/day: 1 pack/day for 50.5 years (50.5 ttl pk-yrs)    Types: Cigarettes    Start date: 1975   Smokeless tobacco: Never  Vaping Use   Vaping status: Every Day  Substance and Sexual Activity   Alcohol use: Yes    Alcohol/week: 6.0 standard drinks of alcohol    Types: 6 Cans of beer per week    Comment: occ   Drug use: No   Sexual activity: Not Currently  Other Topics Concern   Not on file  Social History Narrative   Not on file   Social Drivers of Health   Financial Resource Strain: Low Risk  (05/17/2022)   Received from [facility]   Overall Financial Resource Strain (CARDIA)    Difficulty of Paying Living Expenses: Not hard at all  Food Insecurity: Food Insecurity Present (02/24/2024)  Hunger Vital Sign    Worried About Running Out of Food in the Last Year: Sometimes true    Ran Out of Food in the Last Year: Sometimes true  Transportation Needs: Unmet Transportation Needs (02/24/2024)   PRAPARE - Administrator, Civil Service (Medical): Yes    Lack of Transportation (Non-Medical): Yes  Physical Activity: Not on file  Stress: Not on file  Social Connections: Unknown (02/24/2024)   Social Connection and Isolation Panel    Frequency of Communication with Friends and Family: Once a week    Frequency of Social Gatherings with Friends and Family: Once a week    Attends Religious Services: Patient  declined    Database administrator or Organizations: Patient declined    Attends Banker Meetings: Patient declined    Marital Status: Widowed   Past Medical History:  Past Medical History:  Diagnosis Date   Anemia 05/24/2022   Arthritis    COVID-19    ETOH abuse    Hepatitis C    pt states she has been cured   Hypertension    Seizures (HCC)    not on meds    Past Surgical History:  Procedure Laterality Date   ABDOMINAL HYSTERECTOMY     partial hysterectomy   ELBOW SURGERY     ESOPHAGOGASTRODUODENOSCOPY (EGD) WITH PROPOFOL  N/A 05/24/2022   Procedure: ESOPHAGOGASTRODUODENOSCOPY (EGD) WITH PROPOFOL ;  Surgeon: Unk Corinn Skiff, MD;  Location: [facility] ENDOSCOPY;  Service: Gastroenterology;  Laterality: N/A;    Current Medications: Current Facility-Administered Medications  Medication Dose Route Frequency Provider Last Rate Last Admin   acetaminophen  (TYLENOL ) tablet 650 mg  650 mg Oral Q6H PRN Joliene Salvador, MD   650 mg at 02/29/24 0920   ALPRAZolam  (XANAX ) tablet 0.25 mg  0.25 mg Oral 2 times per day Donnelly Mellow, MD   0.25 mg at 02/29/24 0920   ALPRAZolam  (XANAX ) tablet 0.5 mg  0.5 mg Oral QHS Hardeep Reetz, MD   0.5 mg at 02/28/24 2132   alum & mag hydroxide-simeth (MAALOX/MYLANTA) 200-200-20 MG/5ML suspension 30 mL  30 mL Oral Q4H PRN Jaleeya Mcnelly, MD   30 mL at 02/27/24 9167   amLODipine  (NORVASC ) tablet 10 mg  10 mg Oral Daily Marna Weniger, MD   10 mg at 02/29/24 0920   buprenorphine -naloxone  (SUBOXONE ) 8-2 mg per SL tablet 1 tablet  1 tablet Sublingual BID Rahmon Heigl, MD   1 tablet at 02/29/24 0920   butalbital -acetaminophen -caffeine  (FIORICET ) 50-325-40 MG per tablet 1 tablet  1 tablet Oral Q6H PRN Analeigh Aries, MD   1 tablet at 02/29/24 1044   cholecalciferol  (VITAMIN D3) 25 MCG (1000 UNIT) tablet 2,000 Units  2,000 Units Oral Daily Brynja Marker, MD   2,000 Units at 02/29/24 9073   citalopram  (CELEXA ) tablet 40 mg  40 mg Oral Daily  Janki Dike, MD   40 mg at 02/29/24 0920   cloNIDine  (CATAPRES ) tablet 0.05 mg  0.05 mg Oral [id number] Adore Kithcart, MD   0.05 mg at 02/29/24 9078   furosemide  (LASIX ) tablet 20 mg  20 mg Oral QPC breakfast Donnelly Mellow, MD   20 mg at 02/29/24 0920   hydrALAZINE  (APRESOLINE ) tablet 25 mg  25 mg Oral Q8H PRN Ambri Miltner, MD       ibuprofen  (ADVIL ) tablet 600 mg  600 mg Oral Q8H PRN Vicenta Olds, MD   600 mg at 02/27/24 1532   magnesium  hydroxide (MILK OF MAGNESIA) suspension 30 mL  30 mL Oral  Daily PRN Jolinda Pinkstaff, MD       nicotine  (NICODERM CQ  - dosed in mg/24 hours) patch 14 mg  14 mg Transdermal Daily Kasidy Gianino, MD       OLANZapine  (ZYPREXA ) injection 5 mg  5 mg Intramuscular TID PRN Jahzion Brogden, MD       OLANZapine  zydis (ZYPREXA ) disintegrating tablet 5 mg  5 mg Oral TID PRN Kye Hedden, MD       ondansetron  (ZOFRAN ) tablet 4 mg  4 mg Oral Q8H PRN Khalin Royce, MD       pantoprazole  (PROTONIX ) EC tablet 40 mg  40 mg Oral Daily Esmeralda Blanford, MD   40 mg at 02/29/24 0920   traZODone  (DESYREL ) tablet 50 mg  50 mg Oral QHS PRN Briceida Rasberry, MD   50 mg at 02/28/24 2306    Lab Results: No results found for this or any previous visit (from the past 48 hours).  Blood Alcohol level:  Lab Results  Component Value Date   [facility] <15 02/22/2024   ETH <10 09/22/2023    Metabolic Disorder Labs: Lab Results  Component Value Date   HGBA1C 5.7 (H) 07/18/2023   MPG 116.89 07/18/2023   No results found for: PROLACTIN Lab Results  Component Value Date   CHOL 180 07/18/2023   TRIG 89 07/18/2023   HDL 60 07/18/2023   CHOLHDL 3.0 07/18/2023   VLDL 18 07/18/2023   LDLCALC 102 (H) 07/18/2023    Physical Findings: AIMS:  , ,  ,  ,    CIWA:    COWS:      Psychiatric Specialty Exam:  Presentation  General Appearance:  Appropriate for Environment; Casual  Eye Contact: Fair  Speech: Clear and Coherent  Speech Volume: Normal    Mood and  Affect  Mood: Anxious; Depressed  Affect: Appropriate; Depressed   Thought Process  Thought Processes: Coherent  Descriptions of Associations:Intact  Orientation:Partial  Thought Content:Illogical  Hallucinations: Denies  Ideas of Reference:None  Suicidal Thoughts: Denies  Homicidal Thoughts: Denies   Sensorium  Memory: Immediate Fair; Recent Poor; Remote Poor  Judgment: Impaired  Insight: Shallow   Executive Functions  Concentration: Fair  Attention Span: Fair  Recall:Fair  Fund of Knowledge:Fair  Language:Fair   Psychomotor Activity  Psychomotor Activity: Normal  Musculoskeletal: Strength & Muscle Tone: within normal limits Gait & Station: normal Assets  Assets:Communication Skills; Desire for Improvement; Resilience; Social Support    Physical Exam: Physical Exam Vitals and nursing note reviewed.    ROS Blood pressure (!) 168/76, pulse 78, temperature (!) 97.1 F (36.2 C), resp. rate 20, height 5' 5 (1.651 m), weight 57.8 kg, SpO2 98%. Body mass index is 21.22 kg/m.  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  Patient was recently hospitalized in June and she reappeared to the ED with worsening symptoms.  Patient is noted to be on polypharmacy and heavily sedated.  Patient needs ongoing hospitalization for medication management.   Treatment Plan Summary:   Safety and Monitoring:             -- Voluntary admission to inpatient psychiatric unit for safety, stabilization and treatment             -- Daily contact with patient to  assess and evaluate symptoms and progress in treatment             -- Patient's case to be  discussed in multi-disciplinary team meeting             -- Observation Level: q15 minute checks             -- Vital signs:  q12 hours             -- Precautions: suicide, elopement, and assault   2. Psychiatric Diagnoses and Treatment:             Changed xanax  dose to 0.25 8 AM, 1 PM and 0.5 mg nightly as patient is unstable on her feet with 1 mg nightly of Xanax  Discontinued Zyprexa  dose to 5mg  at bedtime Celexa  40 mg for depression Suboxone  8 mg twice daily-patient is declining at least 1 dose during the daytime -- The risks/benefits/side-effects/alternatives to this medication were discussed in detail with the patient and time was given for questions. The patient consents to medication trial.                -- Metabolic profile and EKG monitoring obtained while on an atypical antipsychotic (BMI: Lipid Panel: HbgA1c: QTc:)              -- Encouraged patient to participate in unit milieu and in scheduled group therapies                            3. Medical Issues Being Addressed: Monitor for Benzo withdrawal HTN: patient never went to see PCP and was on clonidine  0.2 mg BID - reduced clonidine  to 0.05 mg BID and will increase amlodipine  to treat her HTN 4. Discharge Planning:   -- Social work and case management to assist with discharge planning and identification of hospital follow-up needs prior to discharge  -- Estimated LOS: 3-4 days  Elzie Knisley, MD 02/29/2024, 12:41 PM

## 2024-02-29 NOTE — Group Note (Signed)
 Date:  02/29/2024 Time:  9:33 PM  Group Topic/Focus:  Wrap-Up Group:   The focus of this group is to help patients review their daily goal of treatment and discuss progress on daily workbooks.    Participation Level:  Active  Participation Quality:  Appropriate  Affect:  Appropriate  Cognitive:  Appropriate  Insight: Appropriate  Engagement in Group:  Engaged  Modes of Intervention:  Discussion  Additional Comments:    Stephanie Skinner 02/29/2024, 9:33 PM

## 2024-02-29 NOTE — Group Note (Signed)
 Date:  02/29/2024 Time:  11:11 AM  Group Topic/Focus:  Goals Group:   The focus of this group is to help patients establish daily goals to achieve during treatment and discuss how the patient can incorporate goal setting into their daily lives to aide in recovery.    Participation Level:  Active  Participation Quality:  Appropriate  Affect:  Appropriate  Cognitive:  Appropriate  Insight: Appropriate  Engagement in Group:  Engaged  Modes of Intervention:  Discussion   Stephanie Skinner 02/29/2024, 11:11 AM

## 2024-02-29 NOTE — Evaluation (Signed)
 Physical Therapy Evaluation Patient Details Name: Stephanie Skinner MRN: [id number] DOB: April 07, 1956 Today's Date: 02/29/2024  History of Present Illness  68 y/o female presented to ED on 02/22/24 for suicidal ideation. Admitted to geropsych 6/30. PMH: HTN, alcohol abuse, anxiety, MDD  Clinical Impression  Patient admitted with the above. PTA, patient lives alone and was independent with no AD. A friend plans to stay with her at discharge. Patient functioning at modI level with no AD. No overt LOB noted during session. Complaining of R shoulder pain which is chronic. No further skilled PT needs identified acutely. PT will sign off at this time. Please re-consult if there is a change in functional status.         If plan is discharge home, recommend the following: Direct supervision/assist for financial management;Direct supervision/assist for medications management;Assistance with cooking/housework   Can travel by private vehicle        Equipment Recommendations None recommended by PT  Recommendations for Other Services       Functional Status Assessment Patient has not had a recent decline in their functional status     Precautions / Restrictions Precautions Precautions: None Restrictions Weight Bearing Restrictions Per Provider Order: No      Mobility  Bed Mobility               General bed mobility comments: in dayroom pre and post session    Transfers Overall transfer level: Modified independent Equipment used: None                    Ambulation/Gait Ambulation/Gait assistance: Modified independent (Device/Increase time) Gait Distance (Feet): 200 Feet Assistive device: None Gait Pattern/deviations: Step-through pattern, Decreased stride length Gait velocity: decreased     General Gait Details: slow steady gait, denies dizzines  Stairs            Wheelchair Mobility     Tilt Bed    Modified Rankin (Stroke Patients Only)       Balance  Overall balance assessment: Mild deficits observed, not formally tested, History of Falls                                           Pertinent Vitals/Pain Pain Assessment Pain Assessment: Faces Faces Pain Scale: Hurts even more Pain Location: R shoulder Pain Descriptors / Indicators: Grimacing, Discomfort Pain Intervention(s): Limited activity within patient's tolerance, Monitored during session, Repositioned    Home Living Family/patient expects to be discharged to:: Private residence Living Arrangements: Alone (friend is planning on staying with her at discharge) Available Help at Discharge: Friend(s);Available 24 hours/day Type of Home: House Home Access: Stairs to enter Entrance Stairs-Rails: Right;Left;Can reach both Entrance Stairs-Number of Steps: 2   Home Layout: One level Home Equipment: None      Prior Function Prior Level of Function : Independent/Modified Independent;History of Falls (last six months)             Mobility Comments: no AD       Extremity/Trunk Assessment   Upper Extremity Assessment Upper Extremity Assessment: Generalized weakness    Lower Extremity Assessment Lower Extremity Assessment: Generalized weakness    Cervical / Trunk Assessment Cervical / Trunk Assessment: Normal  Communication   Communication Communication: No apparent difficulties    Cognition Arousal: Alert Behavior During Therapy: WFL for tasks assessed/performed   PT - Cognitive impairments: No apparent impairments  Following commands: Intact       Cueing       General Comments      Exercises     Assessment/Plan    PT Assessment Patient does not need any further PT services  PT Problem List         PT Treatment Interventions      PT Goals (Current goals can be found in the Care Plan section)  Acute Rehab PT Goals Patient Stated Goal: to go home PT Goal Formulation: All assessment and  education complete, DC therapy    Frequency       Co-evaluation               AM-PAC PT 6 Clicks Mobility  Outcome Measure Help needed turning from your back to your side while in a flat bed without using bedrails?: None Help needed moving from lying on your back to sitting on the side of a flat bed without using bedrails?: None Help needed moving to and from a bed to a chair (including a wheelchair)?: None Help needed standing up from a chair using your arms (e.g., wheelchair or bedside chair)?: None Help needed to walk in hospital room?: None Help needed climbing 3-5 steps with a railing? : None 6 Click Score: 24    End of Session   Activity Tolerance: Patient tolerated treatment well Patient left: in chair;Other (comment) (in dayroom in geropsych) Nurse Communication: Mobility status PT Visit Diagnosis: Muscle weakness (generalized) (M62.81)    Time: 8594-8585 PT Time Calculation (min) (ACUTE ONLY): 9 min   Charges:   PT Evaluation $PT Eval Low Complexity: 1 Low   PT General Charges $$ ACUTE PT VISIT: 1 Visit         Maryanne Finder, PT, DPT Physical Therapist - [facility]  [facility]   Caiden Monsivais A Leeah Politano 02/29/2024, 2:26 PM

## 2024-02-29 NOTE — Progress Notes (Signed)
   02/28/24 2100  Psych Admission Type (Psych Patients Only)  Admission Status Voluntary  Psychosocial Assessment  Patient Complaints Anxiety  Eye Contact Fair  Facial Expression Animated  Affect Appropriate to circumstance  Speech Logical/coherent  Interaction Assertive  Motor Activity Slow  Appearance/Hygiene In scrubs  Behavior Characteristics Anxious  Mood Anxious  Thought Process  Coherency WDL  Content WDL  Delusions None reported or observed  Perception WDL  Hallucination None reported or observed  Judgment Impaired  Confusion None  Danger to Self  Current suicidal ideation? Denies

## 2024-02-29 NOTE — Plan of Care (Signed)

## 2024-02-29 NOTE — [facility] Suicide Risk Assessment (Addendum)
 [facility] INPATIENT:  Family/Significant Other Suicide Prevention Education  Suicide Prevention Education:  Contact Attempts: Dwayne Dinnar,[phone], (name of family member/significant other) has been identified by the patient as the family member/significant other with whom the patient will be residing, and identified as the person(s) who will aid the patient in the event of a mental health crisis.  With written consent from the patient, two attempts were made to provide suicide prevention education, prior to and/or following the patient's discharge.  We were unsuccessful in providing suicide prevention education. There was no answer and no voicemailbox available. A suicide education pamphlet was given to the patient to share with family/significant other.  Date and time of first attempt:02/28/2024 Date and time of second attempt:02/29/2024, 3:15pm  Stephanie Skinner Niece 02/29/2024, 3:31 PM

## 2024-03-01 DIAGNOSIS — F332 Major depressive disorder, recurrent severe without psychotic features: Secondary | ICD-10-CM | POA: Diagnosis not present

## 2024-03-01 MED ORDER — BUPRENORPHINE HCL-NALOXONE HCL 8-2 MG SL SUBL: 1.0000 | SUBLINGUAL_TABLET | Freq: Two times a day (BID) | SUBLINGUAL | 0 refills | Status: AC

## 2024-03-01 MED ORDER — ALPRAZOLAM 0.25 MG PO TABS: 0.2500 mg | ORAL_TABLET | Freq: Two times a day (BID) | ORAL | 0 refills | Status: AC | PRN

## 2024-03-01 MED ORDER — AMLODIPINE BESYLATE 10 MG PO TABS: 10.0000 mg | ORAL_TABLET | Freq: Every day | ORAL | 0 refills | Status: AC

## 2024-03-01 MED ORDER — CLONIDINE HCL 0.1 MG PO TABS: 0.0500 mg | ORAL_TABLET | ORAL | 11 refills | Status: AC

## 2024-03-01 MED ORDER — VITAMIN D3 25 MCG PO TABS
2000.0000 [IU] | ORAL_TABLET | Freq: Every day | ORAL | 0 refills | Status: AC
Start: 2024-03-02 — End: ?

## 2024-03-01 MED ORDER — TRAZODONE HCL 50 MG PO TABS: 50.0000 mg | ORAL_TABLET | Freq: Every evening | ORAL | 0 refills | Status: AC | PRN

## 2024-03-01 MED ORDER — CITALOPRAM HYDROBROMIDE 40 MG PO TABS: 40.0000 mg | ORAL_TABLET | Freq: Every day | ORAL | 0 refills | Status: AC

## 2024-03-01 MED ORDER — ALPRAZOLAM 0.5 MG PO TABS: 0.5000 mg | ORAL_TABLET | Freq: Every day | ORAL | 0 refills | Status: AC

## 2024-03-01 NOTE — Discharge Summary (Signed)
 Physician Discharge Summary Note  Patient:  Stephanie Skinner is an 68 y.o., female MRN:  [id number] DOB:  03-04-1956 Patient phone:  [phone] (home)  Patient address:   [address] [place] [address],    Date of Admission:  02/24/2024 Date of Discharge: 03/01/24  Reason for Admission:  Patient Is a 68 year old female, with depression and anxiety, who has been struggling significantly since the death of her husband in 07/24/23. At that time, he passed away in hospice and she had a suicide attempt the following day by overdosing on her medications. Patient is admitted to [facility] unit with Q15 min safety monitoring. Multidisciplinary team approach is offered. Medication management; group/milieu therapy is offered   Principal Problem: MDD (major depressive disorder), recurrent severe, without psychosis (HCC) Discharge Diagnoses: Principal Problem:   MDD (major depressive disorder), recurrent severe, without psychosis (HCC)   Past Psychiatric History: see h&p  Family Psychiatric  History: see h&p Social History:  Social History   Substance and Sexual Activity  Alcohol Use Yes   Alcohol/week: 6.0 standard drinks of alcohol   Types: 6 Cans of beer per week   Comment: occ     Social History   Substance and Sexual Activity  Drug Use No    Social History   Socioeconomic History   Marital status: Divorced    Spouse name: Not on file   Number of children: Not on file   Years of education: Not on file   Highest education level: Not on file  Occupational History   Not on file  Tobacco Use   Smoking status: Every Day    Current packs/day: 1.00    Average packs/day: 1 pack/day for 50.5 years (50.5 ttl pk-yrs)    Types: Cigarettes    Start date: 15   Smokeless tobacco: Never  Vaping Use   Vaping status: Every Day  Substance and Sexual Activity   Alcohol use: Yes    Alcohol/week: 6.0 standard drinks of alcohol    Types: 6 Cans of beer per week    Comment: occ    Drug use: No   Sexual activity: Not Currently  Other Topics Concern   Not on file  Social History Narrative   Not on file   Social Drivers of Health   Financial Resource Strain: Low Risk  (05/17/2022)   Received from [facility]   Overall Financial Resource Strain (CARDIA)    Difficulty of Paying Living Expenses: Not hard at all  Food Insecurity: Food Insecurity Present (02/24/2024)   Hunger Vital Sign    Worried About Running Out of Food in the Last Year: Sometimes true    Ran Out of Food in the Last Year: Sometimes true  Transportation Needs: Unmet Transportation Needs (02/24/2024)   PRAPARE - Administrator, Civil Service (Medical): Yes    Lack of Transportation (Non-Medical): Yes  Physical Activity: Not on file  Stress: Not on file  Social Connections: Unknown (02/24/2024)   Social Connection and Isolation Panel    Frequency of Communication with Friends and Family: Once a week    Frequency of Social Gatherings with Friends and Family: Once a week    Attends Religious Services: Patient declined    Database administrator or Organizations: Patient declined    Attends Banker Meetings: Patient declined    Marital Status: Widowed   Past Medical History:  Past Medical History:  Diagnosis Date   Anemia 05/24/2022   Arthritis  COVID-19    ETOH abuse    Hepatitis C    pt states she has been cured   Hypertension    Seizures (HCC)    not on meds    Past Surgical History:  Procedure Laterality Date   ABDOMINAL HYSTERECTOMY     partial hysterectomy   ELBOW SURGERY     ESOPHAGOGASTRODUODENOSCOPY (EGD) WITH PROPOFOL  N/A 05/24/2022   Procedure: ESOPHAGOGASTRODUODENOSCOPY (EGD) WITH PROPOFOL ;  Surgeon: Unk Corinn Skiff, MD;  Location: [facility] ENDOSCOPY;  Service: Gastroenterology;  Laterality: N/A;   Family History:  Family History  Problem Relation Age of Onset   Diabetes Sister    Colon cancer Sister     Hospital Course:  Patient Is a  68 year old female, with depression and anxiety, who has been struggling significantly since the death of her husband in Aug 03, 2023. At that time, he passed away in hospice and she had a suicide attempt the following day by overdosing on her medications. Patient is admitted to [facility] unit with Q15 min safety monitoring. Multidisciplinary team approach is offered. Medication management; group/milieu therapy is offered  Detailed risk assessment is complete based on clinical exam and individual risk factors and acute suicide risk is low and acute violence risk is low.    On admission patient's home medication was started Xanax  1 mg 3 times daily, Suboxone  8 mg twice daily, clonidine  0.2 mg twice daily.  Patient was noted to be heavily sedated, unable to wake up with low blood pressure.  Patient medications fair titrated down with lot of resistance from patient.  Eventually she was on Xanax  0.25 mg twice daily and 0.5 mg nightly, clonidine  reduced to 0.05 mg twice daily and added amlodipine  10 mg for blood pressure management.  She was maintained on Celexa  40 mg daily of her home medicine for depression.  She declined any other changes to her medications.  She maintain safe behaviors on the unit and participated in the groups.  On the day of discharge she consistently denied SI/HI/plan and denied hallucinations.   Currently, all modifiable risk of harm to self/harm to others have been addressed and patient is no longer appropriate for the acute inpatient setting and is able to continue treatment for mental health needs in the community with the supports as indicated below.  Patient is educated and verbalized understanding of discharge plan of care including medications, follow-up appointments, mental health resources and further crisis services in the community.  He is instructed to call 911 or present to the nearest emergency room should he experience any decompensation in mood, disturbance of bowel or  return of suicidal/homicidal ideations.  Patient verbalizes understanding of this education and agrees to this plan of care  Physical Findings: AIMS:  , ,  ,  ,    CIWA:    COWS:        Psychiatric Specialty Exam:  Presentation  General Appearance:  Appropriate for Environment; Casual  Eye Contact: Fair  Speech: Clear and Coherent  Speech Volume: Normal    Mood and Affect  Mood: Euthymic  Affect: Appropriate   Thought Process  Thought Processes: Coherent  Descriptions of Associations:Intact  Orientation:Full (Time, Place and Person)  Thought Content:Logical  Hallucinations:Hallucinations: None  Ideas of Reference:None  Suicidal Thoughts:Suicidal Thoughts: No  Homicidal Thoughts:Homicidal Thoughts: No   Sensorium  Memory: Immediate Fair; Recent Fair; Remote Fair  Judgment: Fair  Insight: Fair   Art therapist  Concentration: Fair  Attention Span: Fair  Recall: [employer]  of Knowledge: Fair  Language: Fair   Lexicographer Activity: Psychomotor Activity: Normal  Musculoskeletal: Strength & Muscle Tone: within normal limits Gait & Station: normal Assets  Assets: Manufacturing systems engineer; Desire for Improvement   Sleep  Sleep: Sleep: Fair    Physical Exam: Physical Exam ROS Blood pressure (!) 160/70, pulse 83, temperature (!) 97.3 F (36.3 C), resp. rate 19, height 5' 5 (1.651 m), weight 57.8 kg, SpO2 97%. Body mass index is 21.22 kg/m.   Social History   Tobacco Use  Smoking Status Every Day   Current packs/day: 1.00   Average packs/day: 1 pack/day for 50.5 years (50.5 ttl pk-yrs)   Types: Cigarettes   Start date: 1975  Smokeless Tobacco Never   Tobacco Cessation:  N/A, patient does not currently use tobacco products   Blood Alcohol level:  Lab Results  Component Value Date   [facility] <15 02/22/2024   ETH <10 09/22/2023    Metabolic Disorder Labs:  Lab Results  Component  Value Date   HGBA1C 5.7 (H) 07/18/2023   MPG 116.89 07/18/2023   No results found for: PROLACTIN Lab Results  Component Value Date   CHOL 180 07/18/2023   TRIG 89 07/18/2023   HDL 60 07/18/2023   CHOLHDL 3.0 07/18/2023   VLDL 18 07/18/2023   LDLCALC 102 (H) 07/18/2023    See Psychiatric Specialty Exam and Suicide Risk Assessment completed by Attending Physician prior to discharge.  Discharge destination:  Home  Is patient on multiple antipsychotic therapies at discharge:  No   Has Patient had three or more failed trials of antipsychotic monotherapy by history:  No  Recommended Plan for Multiple Antipsychotic Therapies: NA  Discharge Instructions     Diet - low sodium heart healthy   Complete by: As directed    Increase activity slowly   Complete by: As directed       Allergies as of 03/01/2024       Reactions   Codeine Nausea And Vomiting   Morphine And Codeine Other (See Comments)   Seizure   Sulfa Antibiotics Nausea And Vomiting        Medication List     STOP taking these medications    chlordiazePOXIDE  25 MG capsule Commonly known as: LIBRIUM    Cholecalciferol  50 MCG (2000 UT) Caps Replaced by: vitamin D3 25 MCG tablet   DULoxetine  30 MG capsule Commonly known as: CYMBALTA    OLANZapine  10 MG tablet Commonly known as: ZYPREXA        TAKE these medications      Indication  ALPRAZolam  0.25 MG tablet Commonly known as: XANAX  Take 1 tablet (0.25 mg total) by mouth 2 (two) times daily as needed for up to 7 days for anxiety. What changed:  medication strength how much to take when to take this    ALPRAZolam  0.5 MG tablet Commonly known as: XANAX  Take 1 tablet (0.5 mg total) by mouth at bedtime for 7 days. What changed: You were already taking a medication with the same name, and this prescription was added. Make sure you understand how and when to take each.    amLODipine  10 MG tablet Commonly known as: NORVASC  Take 1 tablet (10 mg total)  by mouth daily. Start taking on: March 02, 2024  Indication: High Blood Pressure   buprenorphine -naloxone  8-2 mg Subl SL tablet Commonly known as: SUBOXONE  Place 1 tablet under the tongue 2 (two) times daily for 7 days.  Indication: Opioid Dependence   butalbital -acetaminophen -caffeine  50-325-40 MG tablet  Commonly known as: FIORICET  Take 1 tablet by mouth every 6 (six) hours as needed for headache.    citalopram  40 MG tablet Commonly known as: CELEXA  Take 1 tablet (40 mg total) by mouth daily.  Indication: Major Depressive Disorder   cloNIDine  0.1 MG tablet Commonly known as: CATAPRES  Take 0.5 tablets (0.05 mg total) by mouth 2 (two) times daily at 8 am and 4 pm. Start taking on: March 02, 2024 What changed:  medication strength how much to take  Indication: High Blood Pressure   furosemide  20 MG tablet Commonly known as: LASIX  Take 1 tablet (20 mg total) by mouth daily after breakfast.  Indication: High Blood Pressure   multivitamin with minerals Tabs tablet Take 1 tablet by mouth daily.    pantoprazole  40 MG tablet Commonly known as: PROTONIX  Take 1 tablet (40 mg total) by mouth daily.  Indication: Gastroesophageal Reflux Disease   traZODone  50 MG tablet Commonly known as: DESYREL  Take 1 tablet (50 mg total) by mouth at bedtime as needed for sleep.  Indication: Trouble Sleeping, Major Depressive Disorder   vitamin D3 25 MCG tablet Commonly known as: CHOLECALCIFEROL  Take 2 tablets (2,000 Units total) by mouth daily. Start taking on: March 02, 2024 Replaces: Cholecalciferol  50 MCG (2000 UT) Caps         Follow-up Information     [facility] Follow up.   Why: Appointment is scheduled for 03/08/2024 at 1:40 PM with Emmie Chol, NP. Contact information: [address] [place] [address] [phone]         [facility] Follow up.   Specialty: Addiction Medicine Contact information: [address] [place] [address] [phone]                 Follow-up recommendations:  Activity:  As tolerated    Signed: Jervis Trapani, MD 03/01/2024, 3:19 PM

## 2024-03-01 NOTE — BHH Suicide Risk Assessment (Signed)
 [facility] Discharge Suicide Risk Assessment   Principal Problem: MDD (major depressive disorder), recurrent severe, without psychosis (HCC) Discharge Diagnoses: Principal Problem:   MDD (major depressive disorder), recurrent severe, without psychosis (HCC)   Total Time spent with patient: 30 minutes  Musculoskeletal: Strength & Muscle Tone: within normal limits Gait & Station: normal Patient leans: N/A  Psychiatric Specialty Exam  Presentation  General Appearance:  Appropriate for Environment; Casual  Eye Contact: Fair  Speech: Clear and Coherent  Speech Volume: Normal  Handedness: Right   Mood and Affect  Mood: Euthymic  Duration of Depression Symptoms: No data recorded Affect: Appropriate   Thought Process  Thought Processes: Coherent  Descriptions of Associations:Intact  Orientation:Full (Time, Place and Person)  Thought Content:Logical  History of Schizophrenia/Schizoaffective disorder:No data recorded Duration of Psychotic Symptoms:No data recorded Hallucinations:Hallucinations: None  Ideas of Reference:None  Suicidal Thoughts:Suicidal Thoughts: No  Homicidal Thoughts:Homicidal Thoughts: No   Sensorium  Memory: Immediate Fair; Recent Fair; Remote Fair  Judgment: Fair  Insight: Fair   Art therapist  Concentration: Fair  Attention Span: Fair  Recall: [employer] of Knowledge: Fair  Language: Fair   Psychomotor Activity  Psychomotor Activity: Psychomotor Activity: Normal   Assets  Assets: Communication Skills; Desire for Improvement   Sleep  Sleep: Sleep: Fair  Estimated Sleeping Duration (Last 24 Hours): 4.25-5.25 hours  Physical Exam: Physical Exam ROS Blood pressure (!) 160/70, pulse 83, temperature (!) 97.3 F (36.3 C), resp. rate 19, height 5' 5 (1.651 m), weight 57.8 kg, SpO2 97%. Body mass index is 21.22 kg/m.  Mental Status Per Nursing Assessment::   On Admission:  NA  Demographic Factors:   Low socioeconomic status  Loss Factors: Decrease in vocational status  Historical Factors: Impulsivity  Risk Reduction Factors:   Living with another person, especially a relative, Positive social support, Positive therapeutic relationship, and Positive coping skills or problem solving skills  Continued Clinical Symptoms:  Depression:   Comorbid alcohol abuse/dependence  Cognitive Features That Contribute To Risk:  None    Suicide Risk:  Minimal: No identifiable suicidal ideation.  Patients presenting with no risk factors but with morbid ruminations; may be classified as minimal risk based on the severity of the depressive symptoms   Follow-up Information     [facility] Follow up.   Why: Appointment is scheduled for 03/08/2024 at 1:40 PM with Emmie Chol, NP. Contact information: [address] [place] [address] [phone]         [facility] Follow up.   Specialty: Addiction Medicine Contact information: [address] [place] [address] [phone]                 Plan Of Care/Follow-up recommendations:  Activity:  As tolerated  Lovena Kluck, MD 03/01/2024, 3:19 PM

## 2024-03-01 NOTE — Progress Notes (Signed)
 0415-Pt at the nurses station. Flat affect. Sad, depressed and tearful. Amb w/ rolling walker. Endorses depression due to the recent death of her husband in 2024-07-17. Pt reports nightmares. C/o H/A 10/10. PRN Fiorcet 1 tab given at 2346 and Tylenol  650mg  po at 0420 for pain/discomfort. Support and encouragement provided. Q15 min checks maintained for safety. Denies SI/HI/AVH.

## 2024-03-01 NOTE — Group Note (Signed)
 Recreation Therapy Group Note   Group Topic:Emotion Expression  Group Date: 03/01/2024 Start Time: 1500 End Time: 1600 Facilitators: Celestia Jeoffrey BRAVO, LRT, CTRS Location: Dayroom  Group Description: Painting a Diplomatic Services operational officer. Patients and LRT discuss what it means to be "at peace", what it feels like physically and mentally. Pts are given a canvas and watercolor paint to use and encouraged to draw their idea of a peaceful place. Pts and LRT discuss how they use this in their daily life post discharge. Pts are encouraged to take their canvas home with them as a reminder to find their peaceful place whenever they are feeling depressed, anxious, etc.    Goal Area(s) Addressed:  Patient will identify what it means to experience a "peaceful" emotion. Patient will identify a new coping skill.  Patient will express their emotions through art. Patients will increase communication by talking with LRT and peers while in group   Affect/Mood: Appropriate   Participation Level: Active and Engaged   Participation Quality: Independent   Behavior: Appropriate, Calm, and Cooperative   Speech/Thought Process: Coherent   Insight: Good   Judgement: Good   Modes of Intervention: Art   Patient Response to Interventions:  Attentive, Engaged, Interested , and Receptive   Education Outcome:  Acknowledges education   Clinical Observations/Individualized Feedback: Laynie was active in their participation of session activities and group discussion. Pt identified being outside in mature or dancing as peaceful. Pt appropriately expressed that through art. Pt interacted well with LRT and peers duration of session.    Plan: Continue to engage patient in RT group sessions 2-3x/week.   Jeoffrey BRAVO Celestia, LRT, CTRS 03/01/2024 5:24 PM

## 2024-03-01 NOTE — Plan of Care (Signed)
  Problem: Education: Goal: Utilization of techniques to improve thought processes will improve Outcome: Progressing Goal: Knowledge of the prescribed therapeutic regimen will improve Outcome: Progressing   Problem: Activity: Goal: Interest or engagement in leisure activities will improve Outcome: Progressing Goal: Imbalance in normal sleep/wake cycle will improve Outcome: Progressing   Problem: Coping: Goal: Coping ability will improve Outcome: Progressing Goal: Will verbalize feelings Outcome: Progressing   Problem: Health Behavior/Discharge Planning: Goal: Ability to make decisions will improve Outcome: Progressing Goal: Compliance with therapeutic regimen will improve Outcome: Progressing   

## 2024-03-01 NOTE — Group Note (Signed)
 Date:  03/01/2024 Time:  10:29 AM  Group Topic/Focus:  Coping With Mental Health Crisis:   The purpose of this group is to help patients identify strategies for coping with mental health crisis.  Group discusses possible causes of crisis and ways to manage them effectively.    Participation Level:  Active  Participation Quality:  Appropriate  Affect:  Appropriate  Cognitive:  Appropriate  Insight: Appropriate  Engagement in Group:  Engaged  Modes of Intervention:  Activity  Additional Comments:    Stephanie Skinner 03/01/2024, 10:29 AM

## 2024-03-01 NOTE — Progress Notes (Signed)
   02/29/24 2100  Psych Admission Type (Psych Patients Only)  Admission Status Voluntary  Psychosocial Assessment  Patient Complaints Anxiety  Eye Contact Brief  Facial Expression Animated  Affect Anxious  Speech Logical/coherent  Interaction Assertive  Motor Activity Slow  Appearance/Hygiene In scrubs  Behavior Characteristics Anxious  Mood Sad  Thought Process  Coherency WDL  Content WDL  Delusions None reported or observed  Perception WDL  Hallucination None reported or observed  Judgment Impaired  Confusion None  Danger to Self  Current suicidal ideation? Denies

## 2024-03-01 NOTE — Plan of Care (Signed)
  Problem: Education: Goal: Utilization of techniques to improve thought processes will improve 03/01/2024 1822 by Wright Benton CROME, RN Outcome: Adequate for Discharge 03/01/2024 1822 by Wright Benton CROME, RN Outcome: Adequate for Discharge Goal: Knowledge of the prescribed therapeutic regimen will improve 03/01/2024 1822 by Wright Benton CROME, RN Outcome: Adequate for Discharge 03/01/2024 1822 by Wright Benton CROME, RN Outcome: Adequate for Discharge   Problem: Activity: Goal: Interest or engagement in leisure activities will improve 03/01/2024 1822 by Wright Benton CROME, RN Outcome: Adequate for Discharge 03/01/2024 1822 by Wright Benton CROME, RN Outcome: Adequate for Discharge Goal: Imbalance in normal sleep/wake cycle will improve 03/01/2024 1822 by Wright Benton CROME, RN Outcome: Adequate for Discharge 03/01/2024 1822 by Wright Benton CROME, RN Outcome: Adequate for Discharge   Problem: Coping: Goal: Coping ability will improve 03/01/2024 1822 by Wright Benton CROME, RN Outcome: Adequate for Discharge 03/01/2024 1822 by Wright Benton CROME, RN Outcome: Adequate for Discharge Goal: Will verbalize feelings 03/01/2024 1822 by Wright Benton CROME, RN Outcome: Adequate for Discharge 03/01/2024 1822 by Wright Benton CROME, RN Outcome: Adequate for Discharge   Problem: Health Behavior/Discharge Planning: Goal: Ability to make decisions will improve 03/01/2024 1822 by Wright Benton CROME, RN Outcome: Adequate for Discharge 03/01/2024 1822 by Wright Benton CROME, RN Outcome: Adequate for Discharge Goal: Compliance with therapeutic regimen will improve 03/01/2024 1822 by Wright Benton CROME, RN Outcome: Adequate for Discharge 03/01/2024 1822 by Wright Benton CROME, RN Outcome: Adequate for Discharge   Problem: Role Relationship: Goal: Will demonstrate positive changes in social behaviors and relationships 03/01/2024 1822 by Wright Benton CROME, RN Outcome: Adequate for Discharge 03/01/2024 1822 by Wright Benton CROME, RN Outcome: Adequate for Discharge   Problem: Safety: Goal: Ability to disclose and discuss suicidal ideas will improve 03/01/2024 1822 by Wright Benton CROME, RN Outcome: Adequate for Discharge 03/01/2024 1822 by Wright Benton CROME, RN Outcome: Adequate for Discharge Goal: Ability to identify and utilize support systems that promote safety will improve 03/01/2024 1822 by Wright Benton CROME, RN Outcome: Adequate for Discharge 03/01/2024 1822 by Wright Benton CROME, RN Outcome: Adequate for Discharge   Problem: Self-Concept: Goal: Will verbalize positive feelings about self 03/01/2024 1822 by Wright Benton CROME, RN Outcome: Adequate for Discharge 03/01/2024 1822 by Wright Benton CROME, RN Outcome: Adequate for Discharge Goal: Level of anxiety will decrease 03/01/2024 1822 by Wright Benton CROME, RN Outcome: Adequate for Discharge 03/01/2024 1822 by Wright Benton CROME, RN Outcome: Adequate for Discharge  Patient alert and oriented x 4.  Affect is pleasant and calm. Denies anxiety, SI/HI or AVH.  Patient states they will try to keep themselves safe when they return home.  Reviewed discharge instructions with patient including follow up appointment with provider, medication and prescriptions.  Questions answered and understanding verbalized.  Discharge packet given.  All belongings returned to patient after verification completed by staff.    Patient escorted by staff off unit at this time stable without complaint.

## 2024-03-01 NOTE — Progress Notes (Signed)
   03/01/24 0829  Psych Admission Type (Psych Patients Only)  Admission Status Voluntary  Psychosocial Assessment  Patient Complaints Anxiety  Eye Contact Brief  Facial Expression Animated  Affect Anxious  Speech Logical/coherent  Interaction Assertive;Needy  Motor Activity Slow  Appearance/Hygiene In scrubs  Behavior Characteristics Cooperative;Anxious  Mood Anxious;Depressed  Thought Process  Coherency WDL  Content WDL  Delusions None reported or observed  Perception WDL  Hallucination None reported or observed  Judgment Impaired  Confusion None  Danger to Self  Current suicidal ideation? Denies

## 2024-03-02 NOTE — Progress Notes (Signed)
  [facility] Adult Case Management Discharge Plan :  Will you be returning to the same living situation after discharge:  Yes,  pt reports that she is returning home At discharge, do you have transportation home?: Yes,  CSW to assist with transportation needs Do you have the ability to pay for your medications: Yes,  [employer] MEDICARE / [employer] MEDICARE CHOICE PPO  Release of information consent forms completed and in the chart;  Patient's signature needed at discharge.  Patient to Follow up at:  Follow-up Information     [facility] Follow up.   Why: Appointment is scheduled for 03/08/2024 at 1:40 PM with Emmie Chol, NP. Contact information: [address] [place] [address] [phone]         [facility] Follow up.   Why: Appointment is scheduled for 07/11 at [facility] information: [address]  [address] [place] [address] [phone]                 Next level of care provider has access to [facility] Link:no  Safety Planning and Suicide Prevention discussed: Yes,  SPE completed with the patient      Has patient been referred to the Quitline?: Patient refused referral for treatment  Patient has been referred for addiction treatment: Patient refused referral for treatment.  [name], LCSW 03/02/2024, 8:12 AM

## 2024-03-02 NOTE — Care Management Important Message (Signed)
 Important Message  Patient Details  Name: Stephanie Skinner MRN: [id number] Date of Birth: 03/27/1956   Important Message Given:  Yes - Medicare IM     [name], LCSW 03/02/2024, 8:13 AM

## 2024-05-12 IMAGING — MR MR THORACIC SPINE W/O CM
6 series · 33 of 48 positions shown · non-contrast
Comparison: CT cervical spine 02/12/2022

CLINICAL DATA: Pain. Thoracic compression fracture. Withdrawal from
alcohol and Suboxone.

EXAM:
MRI THORACIC SPINE WITHOUT CONTRAST
TECHNIQUE: Multiplanar, multisequence MR imaging of the thoracic spine was
performed. No intravenous contrast was administered.

[Series 1: T1 · sagittal · 5.0mm · 1.88mm/px · 4 of 9 slices shown (1 of 2)]
[im 1/9]
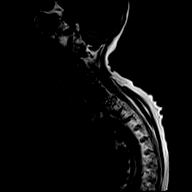
[im 3/9]
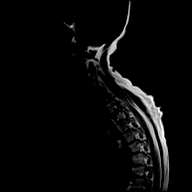
[im 6/9]
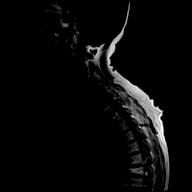
[im 9/9]
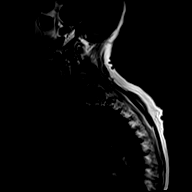

[Series 18: T2 · sagittal · 3.0mm · 1.33mm/px · 6 of 19 slices shown (1 of 2)]
[im 1/19]
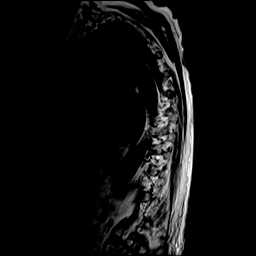
[im 4/19]
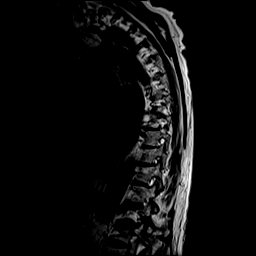
[im 8/19]
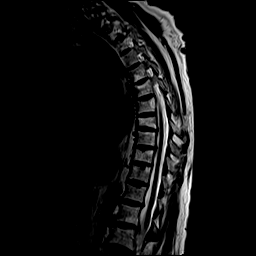
[im 11/19]
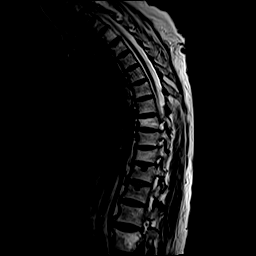
[im 15/19]
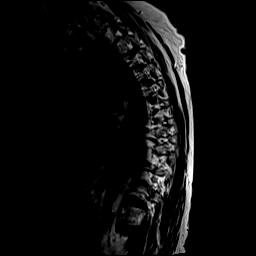
[im 19/19]
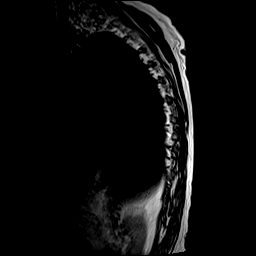

[Series 19: T1 · sagittal · 3.0mm · 1.33mm/px · 6 of 19 slices shown (2 of 2)]
[im 1/19]
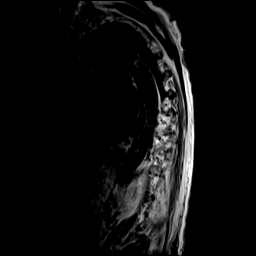
[im 4/19]
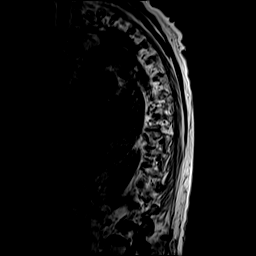
[im 8/19]
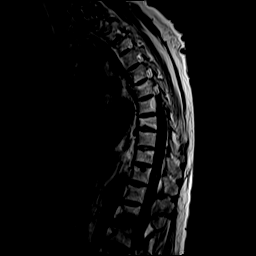
[im 11/19]
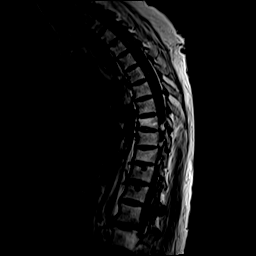
[im 15/19]
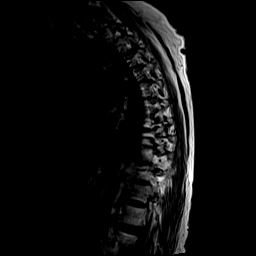
[im 19/19]
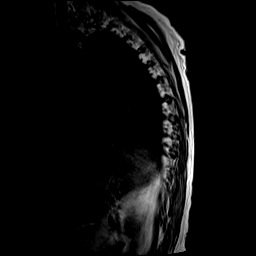

[Series 20: STIR · sagittal · 3.0mm · 0.66mm/px · 6 of 19 slices shown]
[im 1/19]
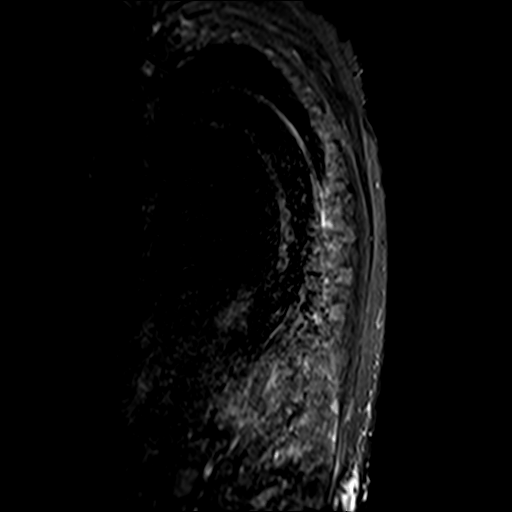
[im 4/19]
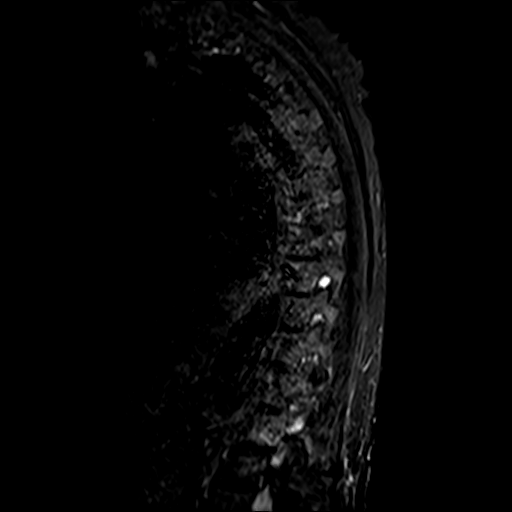
[im 8/19]
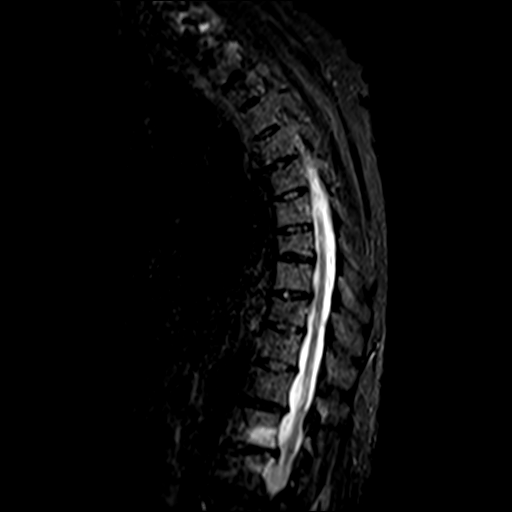
[im 11/19]
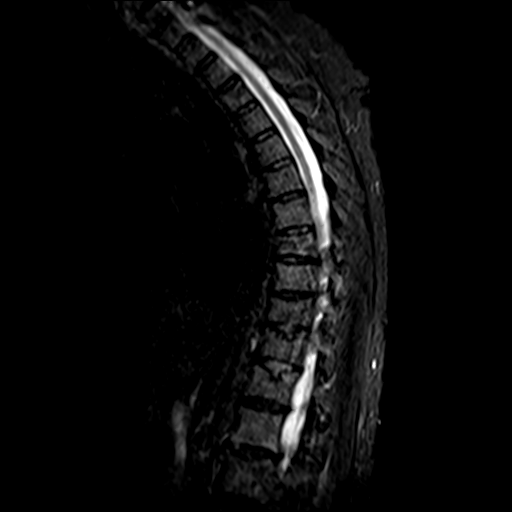
[im 15/19]
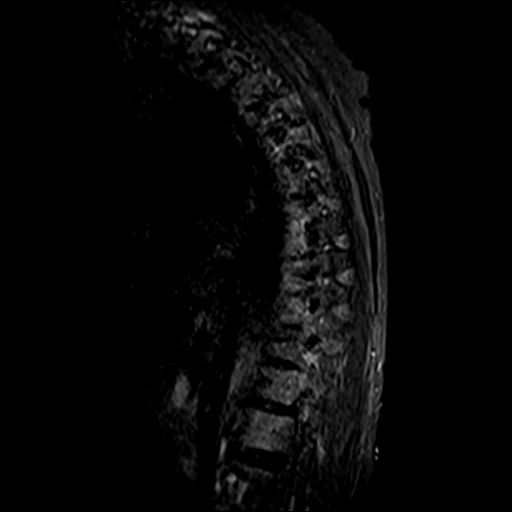
[im 19/19]
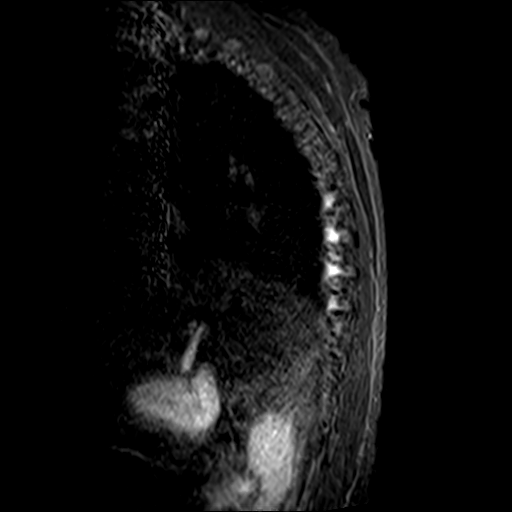

[Series 21: T2 · axial · 4.0mm · 0.59mm/px · z∈[-305,-127]mm · 9 of 42 slices shown (2 of 2)]
[im 1/42]
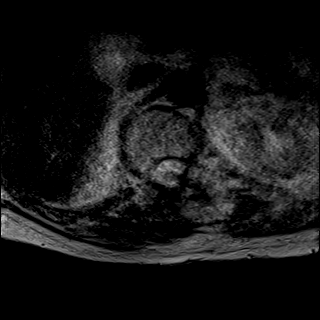
[im 7/42]
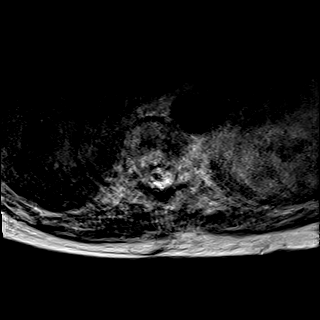
[im 14/42]
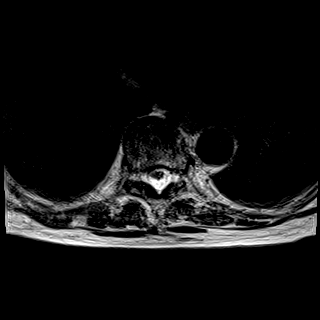
[im 18/42]
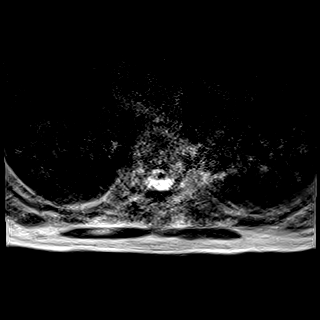
[im 21/42]
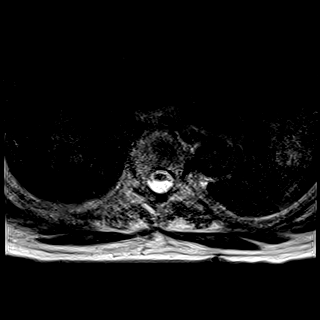
[im 24/42]
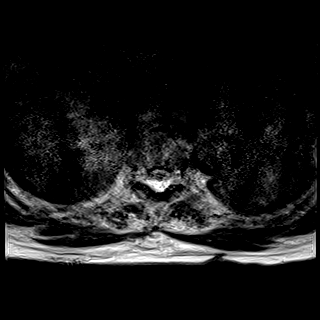
[im 28/42]
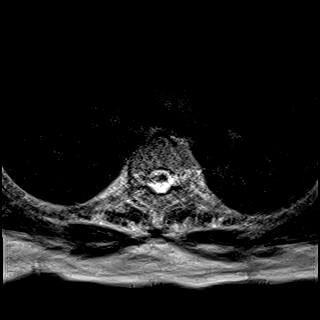
[im 35/42]
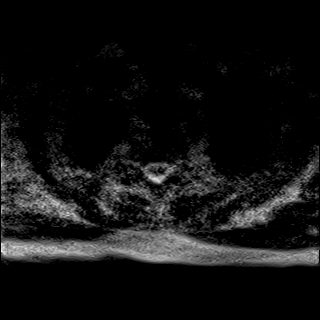
[im 42/42]
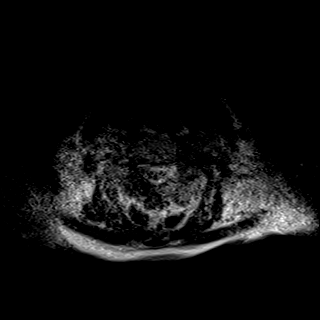

[Series 22: GRE · axial · 4.0mm · 0.37mm/px · z∈[-305,-255]mm · 2 of 42 slices shown]
[im 1/42]
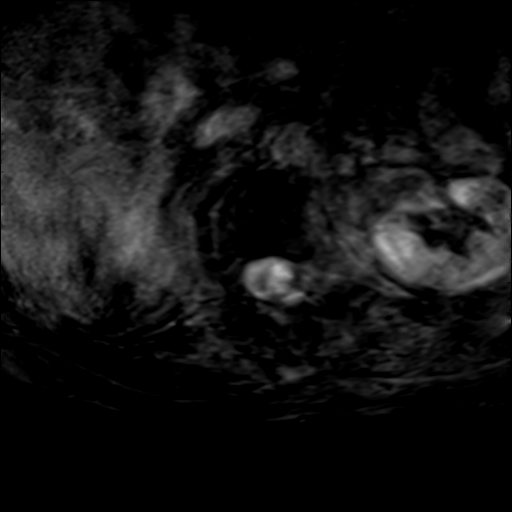
[im 7/42]
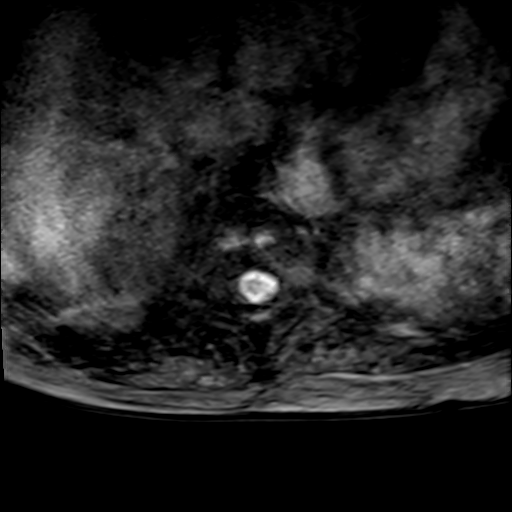

[33 of 48 positions shown; findings below may reference images not displayed]

FINDINGS: Despite efforts by the technologist and patient, motion artifact is
present on today's exam and could not be eliminated. This reduces
exam sensitivity and specificity.

Alignment: Minimal grade 1 anterolisthesis of C7 on T1 unchanged
from today's CT cervical spine mild dextrocurvature centered at
T10-11.

Vertebrae: Minimal anterior superior T2 and mild anterior superior
T3 vertebral body height loss. Motion artifact limits evaluation for
possible subtle marrow edema. It is difficult to exclude very mild
anterior T3 vertebral body marrow edema and this compression
fracture may be subacute. There does appear to be a lucency
extending through the superior endplate of the T3 vertebral body
entities CT cervical spine which would support an incompletely
healed mild compression fracture.

Mild approximate 20% height loss of the anterior T8 vertebral body
with mild inferior endplate horizontal marrow edema (sagittal series
20, image 10), likely an acute to subacute mild compression
fracture. Minimal superior T10 through T12 endplate concavities with
associated degenerative Schmorl's nodes and no definite marrow edema
likely chronic.

Moderate to high-grade posteroinferior L1 and posterosuperior L2
endplate marrow edema left-greater-than-right with associated severe
posterior left disc space narrowing (sagittal series 20 images 10
through 14). The marrow edema may be secondary to the high-grade
degenerative disc change, however cannot exclude an acute fracture
in this region without significant vertebral body height loss.

Moderate to severe left T12-L1, mild diffuse T8-9, and mild anterior
T6-7 disc space narrowing.

Cord: Within the limitations of patient motion artifact, the
thoracic cord demonstrates normal signal and caliber. The conus
terminates at the approximate L1-2 level.

Paraspinal and other soft tissues: There is high-grade motion
artifact on axial images, limiting evaluation. No gross abnormality
is seen within the paraspinal soft tissues.

Disc levels:

Please see contemporaneous MRI of the cervical spine for evaluation
of the C6-7 and C7-T1 levels.

T2-3: Mild left intraforaminal disc extension with borderline mild
left neuroforaminal stenosis.

T8-9: Mild broad-based posterior disc osteophyte complex mildly
contacts the ventral cord. Adequate CSF still seen posterior to the
cord. Mild right neuroforaminal narrowing. No central canal
stenosis.

T9-10: Mild-to-moderate broad-based posterior disc bulge with
superimposed right paracentral posterior disc protrusion mildly
contacts the ventral cord (sagittal series 18, image 11). Mild right
lateral recess narrowing. No significant neuroforaminal or central
canal stenosis.

T10-11: Mild broad-based posterior disc bulge. Moderate bilateral
facet joint hypertrophy. Mild central canal stenosis.
Mild-to-moderate bilateral neuroforaminal stenosis.

T11-12: Mild bilateral facet joint hypertrophy. Mild broad-based
posterior disc bulge. No significant neuroforaminal stenosis. No
central canal stenosis.

T12-L1: Moderate bilateral facet joint hypertrophy. Mild
left-greater-than-right posterior disc bulge. Mild-to-moderate left
and borderline mild right neuroforaminal stenosis. Mild narrowing of
the lateral recesses. No significant central canal stenosis.

L1-2: Seen on sagittal images only. Facet joint hypertrophy and
left-greater-than-right posterior disc osteophyte complex contribute
to moderate left and mild right neuroforaminal stenosis. Likely mild
central canal stenosis.
IMPRESSION: 1. Unfortunately, patient motion artifact moderately to markedly
technically degrades this examination.
2. Mild anterior superior T3 vertebral body height loss. It is
difficult to exclude very mild anterior T3 vertebral body marrow
edema and this compression fracture may be subacute.
3. Likely acute to subacute mild compression fracture of the T8
vertebral body.
4. Moderate to high-grade posterior left L1-2 disc space narrowing
and endplate marrow edema. The marrow edema may be secondary to the
high-grade degenerative disc change, however cannot exclude an acute
fracture in this region. No significant vertebral body height loss.
5. Multilevel degenerative disc and joint changes as above. Mild
T10-11 central canal stenosis. Multilevel neuroforaminal stenosis as
above.
